# Patient Record
Sex: Male | Born: 1990 | Hispanic: No | Smoking: Current some day smoker
Health system: Southern US, Community
[De-identification: ages and names within clinical notes are randomized; demographics above are authoritative.]

---

## 2020-11-27 DIAGNOSIS — I514 Myocarditis, unspecified: Secondary | ICD-10-CM

## 2020-11-27 DIAGNOSIS — I319 Disease of pericardium, unspecified: Secondary | ICD-10-CM

## 2020-11-27 HISTORY — DX: Myocarditis, unspecified: I51.4

## 2020-11-27 HISTORY — DX: Disease of pericardium, unspecified: I31.9

## 2020-12-12 ENCOUNTER — Inpatient Hospital Stay (HOSPITAL_COMMUNITY)
Admission: EM | Admit: 2020-12-12 | Discharge: 2020-12-16 | DRG: 314 | Disposition: A | Discharge status: Home / Self Care | Payer: BC Managed Care – PPO | Attending: Internal Medicine | Admitting: Internal Medicine

## 2020-12-12 ENCOUNTER — Other Ambulatory Visit: Payer: Self-pay

## 2020-12-12 ENCOUNTER — Encounter (HOSPITAL_COMMUNITY): Payer: Self-pay | Admitting: Emergency Medicine

## 2020-12-12 DIAGNOSIS — R509 Fever, unspecified: Secondary | ICD-10-CM | POA: Diagnosis not present

## 2020-12-12 DIAGNOSIS — I514 Myocarditis, unspecified: Secondary | ICD-10-CM | POA: Diagnosis present

## 2020-12-12 DIAGNOSIS — I400 Infective myocarditis: Secondary | ICD-10-CM | POA: Diagnosis not present

## 2020-12-12 DIAGNOSIS — I319 Disease of pericardium, unspecified: Secondary | ICD-10-CM | POA: Diagnosis present

## 2020-12-12 DIAGNOSIS — Z8249 Family history of ischemic heart disease and other diseases of the circulatory system: Secondary | ICD-10-CM

## 2020-12-12 DIAGNOSIS — R112 Nausea with vomiting, unspecified: Secondary | ICD-10-CM | POA: Diagnosis present

## 2020-12-12 DIAGNOSIS — J189 Pneumonia, unspecified organism: Secondary | ICD-10-CM | POA: Diagnosis present

## 2020-12-12 DIAGNOSIS — I5021 Acute systolic (congestive) heart failure: Secondary | ICD-10-CM | POA: Diagnosis present

## 2020-12-12 DIAGNOSIS — R6510 Systemic inflammatory response syndrome (SIRS) of non-infectious origin without acute organ dysfunction: Secondary | ICD-10-CM | POA: Diagnosis present

## 2020-12-12 DIAGNOSIS — Z888 Allergy status to other drugs, medicaments and biological substances status: Secondary | ICD-10-CM

## 2020-12-12 DIAGNOSIS — E876 Hypokalemia: Secondary | ICD-10-CM | POA: Diagnosis present

## 2020-12-12 DIAGNOSIS — E869 Volume depletion, unspecified: Secondary | ICD-10-CM | POA: Diagnosis present

## 2020-12-12 DIAGNOSIS — I409 Acute myocarditis, unspecified: Secondary | ICD-10-CM | POA: Diagnosis present

## 2020-12-12 DIAGNOSIS — I959 Hypotension, unspecified: Secondary | ICD-10-CM | POA: Diagnosis present

## 2020-12-12 DIAGNOSIS — A419 Sepsis, unspecified organism: Secondary | ICD-10-CM

## 2020-12-12 DIAGNOSIS — F17210 Nicotine dependence, cigarettes, uncomplicated: Secondary | ICD-10-CM | POA: Diagnosis present

## 2020-12-12 DIAGNOSIS — R197 Diarrhea, unspecified: Secondary | ICD-10-CM | POA: Diagnosis present

## 2020-12-12 DIAGNOSIS — Z28310 Unvaccinated for covid-19: Secondary | ICD-10-CM

## 2020-12-12 DIAGNOSIS — Z8616 Personal history of COVID-19: Secondary | ICD-10-CM

## 2020-12-12 DIAGNOSIS — Z20822 Contact with and (suspected) exposure to covid-19: Secondary | ICD-10-CM | POA: Diagnosis present

## 2020-12-12 NOTE — ED Triage Notes (Signed)
Patient arrives complaining of vomiting, diarrhea and body aches since Wednesday. Patient states he felt bad and left work and was seen at urgent care. Patient states they gave him IV fluids, and prescribed him phenergan for nausea. Patient states medications have not been successful. Patient has had more episodes of vomiting. Patient states his body aches are starting to feel worse.

## 2020-12-13 ENCOUNTER — Encounter (HOSPITAL_COMMUNITY): Payer: Self-pay | Admitting: Emergency Medicine

## 2020-12-13 ENCOUNTER — Emergency Department (HOSPITAL_COMMUNITY): Payer: BC Managed Care – PPO

## 2020-12-13 ENCOUNTER — Observation Stay (HOSPITAL_COMMUNITY): Payer: BC Managed Care – PPO

## 2020-12-13 DIAGNOSIS — R6510 Systemic inflammatory response syndrome (SIRS) of non-infectious origin without acute organ dysfunction: Secondary | ICD-10-CM

## 2020-12-13 DIAGNOSIS — Z888 Allergy status to other drugs, medicaments and biological substances status: Secondary | ICD-10-CM | POA: Diagnosis not present

## 2020-12-13 DIAGNOSIS — Z8616 Personal history of COVID-19: Secondary | ICD-10-CM | POA: Diagnosis not present

## 2020-12-13 DIAGNOSIS — I319 Disease of pericardium, unspecified: Secondary | ICD-10-CM | POA: Diagnosis present

## 2020-12-13 DIAGNOSIS — F17210 Nicotine dependence, cigarettes, uncomplicated: Secondary | ICD-10-CM | POA: Diagnosis present

## 2020-12-13 DIAGNOSIS — R778 Other specified abnormalities of plasma proteins: Secondary | ICD-10-CM | POA: Diagnosis not present

## 2020-12-13 DIAGNOSIS — Z20822 Contact with and (suspected) exposure to covid-19: Secondary | ICD-10-CM | POA: Diagnosis present

## 2020-12-13 DIAGNOSIS — I400 Infective myocarditis: Secondary | ICD-10-CM

## 2020-12-13 DIAGNOSIS — Z28310 Unvaccinated for covid-19: Secondary | ICD-10-CM | POA: Diagnosis not present

## 2020-12-13 DIAGNOSIS — J189 Pneumonia, unspecified organism: Secondary | ICD-10-CM | POA: Diagnosis present

## 2020-12-13 DIAGNOSIS — E869 Volume depletion, unspecified: Secondary | ICD-10-CM | POA: Diagnosis present

## 2020-12-13 DIAGNOSIS — R509 Fever, unspecified: Secondary | ICD-10-CM

## 2020-12-13 DIAGNOSIS — R9431 Abnormal electrocardiogram [ECG] [EKG]: Secondary | ICD-10-CM | POA: Diagnosis not present

## 2020-12-13 DIAGNOSIS — R197 Diarrhea, unspecified: Secondary | ICD-10-CM | POA: Diagnosis present

## 2020-12-13 DIAGNOSIS — R112 Nausea with vomiting, unspecified: Secondary | ICD-10-CM | POA: Diagnosis present

## 2020-12-13 DIAGNOSIS — R079 Chest pain, unspecified: Secondary | ICD-10-CM | POA: Diagnosis not present

## 2020-12-13 DIAGNOSIS — I514 Myocarditis, unspecified: Secondary | ICD-10-CM | POA: Diagnosis not present

## 2020-12-13 DIAGNOSIS — E876 Hypokalemia: Secondary | ICD-10-CM | POA: Diagnosis present

## 2020-12-13 DIAGNOSIS — I409 Acute myocarditis, unspecified: Secondary | ICD-10-CM | POA: Diagnosis present

## 2020-12-13 DIAGNOSIS — I959 Hypotension, unspecified: Secondary | ICD-10-CM | POA: Diagnosis present

## 2020-12-13 DIAGNOSIS — I5021 Acute systolic (congestive) heart failure: Secondary | ICD-10-CM | POA: Diagnosis present

## 2020-12-13 DIAGNOSIS — Z8249 Family history of ischemic heart disease and other diseases of the circulatory system: Secondary | ICD-10-CM | POA: Diagnosis not present

## 2020-12-13 LAB — URINALYSIS, ROUTINE W REFLEX MICROSCOPIC
Bacteria, UA: NONE SEEN
Bilirubin Urine: NEGATIVE
Glucose, UA: NEGATIVE mg/dL
Hgb urine dipstick: NEGATIVE
Leukocytes,Ua: NEGATIVE
Nitrite: NEGATIVE
Protein, ur: NEGATIVE mg/dL
Specific Gravity, Urine: 1.01 (ref 1.005–1.030)
pH: 6 (ref 5.0–8.0)

## 2020-12-13 LAB — COMPREHENSIVE METABOLIC PANEL
ALT: 30 U/L (ref 0–44)
AST: 38 U/L (ref 15–41)
Albumin: 4.2 g/dL (ref 3.5–5.0)
Alkaline Phosphatase: 43 U/L (ref 38–126)
Anion gap: 10 (ref 5–15)
BUN: 13 mg/dL (ref 6–20)
CO2: 24 mmol/L (ref 22–32)
Calcium: 8.7 mg/dL — ABNORMAL LOW (ref 8.9–10.3)
Chloride: 101 mmol/L (ref 98–111)
Creatinine, Ser: 1.18 mg/dL (ref 0.61–1.24)
GFR, Estimated: >60 mL/min (ref >60)
Glucose, Bld: 124 mg/dL — ABNORMAL HIGH (ref 70–99)
Potassium: 4.1 mmol/L (ref 3.5–5.1)
Sodium: 135 mmol/L (ref 135–145)
Total Bilirubin: 0.8 mg/dL (ref 0.3–1.2)
Total Protein: 7.1 g/dL (ref 6.5–8.1)

## 2020-12-13 LAB — CBC
HCT: 42.6 % (ref 39.0–52.0)
Hemoglobin: 14.4 g/dL (ref 13.0–17.0)
MCH: 29.4 pg (ref 26.0–34.0)
MCHC: 33.8 g/dL (ref 30.0–36.0)
MCV: 86.9 fL (ref 80.0–100.0)
Platelets: 178 10*3/uL (ref 150–400)
RBC: 4.9 MIL/uL (ref 4.22–5.81)
RDW: 12.2 % (ref 11.5–15.5)
WBC: 10.2 10*3/uL (ref 4.0–10.5)
nRBC: 0 % (ref 0.0–0.2)

## 2020-12-13 LAB — ECHOCARDIOGRAM COMPLETE
AV Mean grad: 2 mmHg
AV Peak grad: 3.5 mmHg
Ao pk vel: 0.94 m/s
Area-P 1/2: 5.42 cm2
Calc EF: 43.7 %
Height: 71 in
Single Plane A2C EF: 43.4 %
Single Plane A4C EF: 42.3 %
Weight: 3200 oz

## 2020-12-13 LAB — C-REACTIVE PROTEIN: CRP: 6.5 mg/dL — ABNORMAL HIGH (ref <1.0)

## 2020-12-13 LAB — RESPIRATORY PANEL BY PCR

## 2020-12-13 LAB — PROCALCITONIN: Procalcitonin: <0.1 ng/mL

## 2020-12-13 LAB — MAGNESIUM: Magnesium: 1.9 mg/dL (ref 1.7–2.4)

## 2020-12-13 LAB — LIPASE, BLOOD: Lipase: 27 U/L (ref 11–51)

## 2020-12-13 LAB — RESP PANEL BY RT-PCR (FLU A&B, COVID) ARPGX2
Influenza A by PCR: NEGATIVE
Influenza B by PCR: NEGATIVE
SARS Coronavirus 2 by RT PCR: NEGATIVE

## 2020-12-13 LAB — CK: Total CK: 147 U/L (ref 49–397)

## 2020-12-13 LAB — LACTIC ACID, PLASMA: Lactic Acid, Venous: 0.8 mmol/L (ref 0.5–1.9)

## 2020-12-13 LAB — TROPONIN I (HIGH SENSITIVITY)
Troponin I (High Sensitivity): 1614 ng/L (ref <18)
Troponin I (High Sensitivity): 1447 ng/L (ref <18)
Troponin I (High Sensitivity): 9714 ng/L (ref <18)

## 2020-12-13 LAB — HIV ANTIBODY (ROUTINE TESTING W REFLEX): HIV Screen 4th Generation wRfx: NONREACTIVE

## 2020-12-13 LAB — MRSA NEXT GEN BY PCR, NASAL: MRSA by PCR Next Gen: NOT DETECTED

## 2020-12-13 LAB — MONONUCLEOSIS SCREEN: Mono Screen: NEGATIVE

## 2020-12-13 LAB — SEDIMENTATION RATE: Sed Rate: 9 mm/hr (ref 0–16)

## 2020-12-13 LAB — D-DIMER, QUANTITATIVE: D-Dimer, Quant: 0.36 ug/mL-FEU (ref 0.00–0.50)

## 2020-12-13 LAB — BRAIN NATRIURETIC PEPTIDE: B Natriuretic Peptide: 34.5 pg/mL (ref 0.0–100.0)

## 2020-12-13 IMAGING — CR DG CHEST 2V
2 series · 2 of 2 positions shown · non-contrast
Comparison: [DATE]

CLINICAL DATA: Shortness of breath

EXAM:
CHEST - 2 VIEW

[w chest pa]
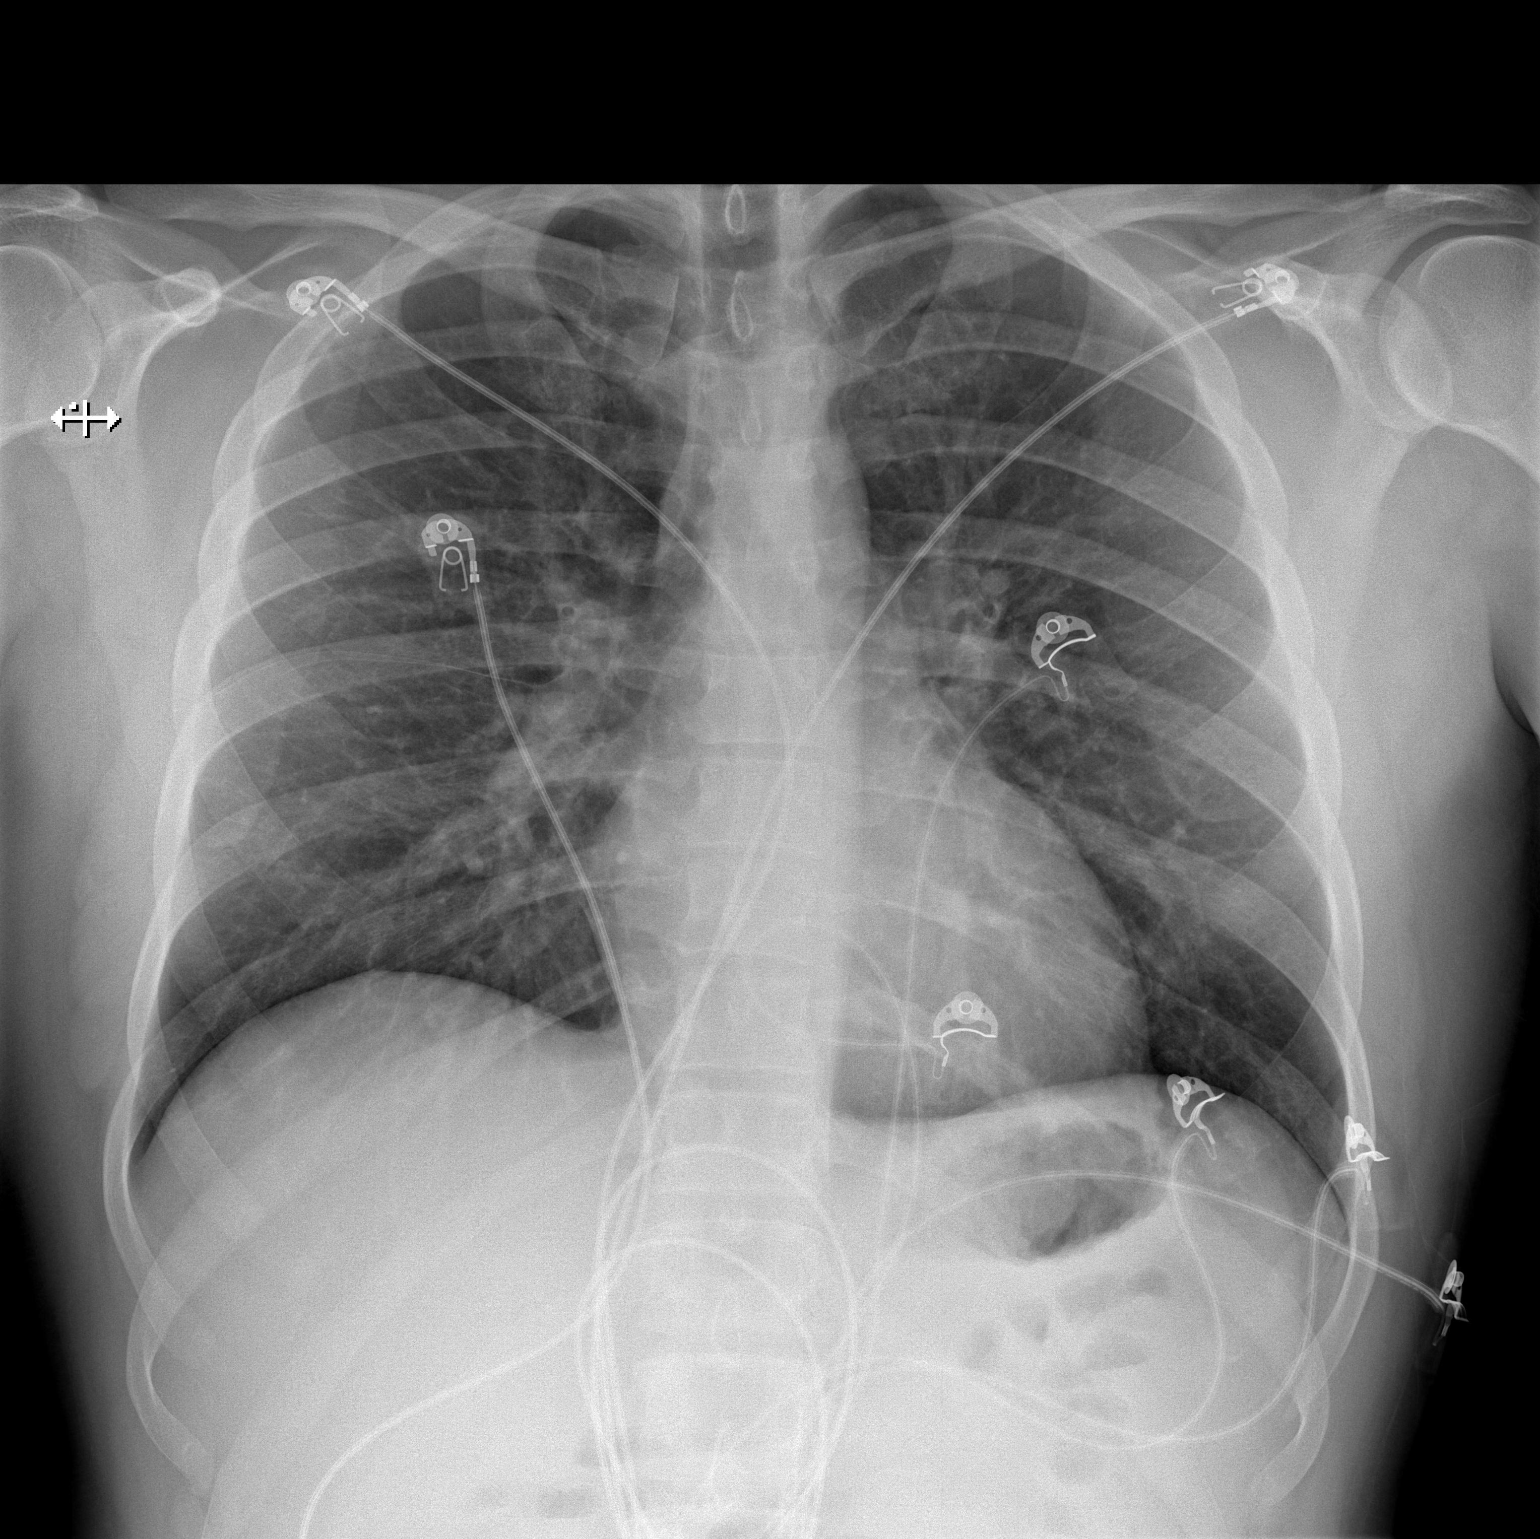

[w chest lat]
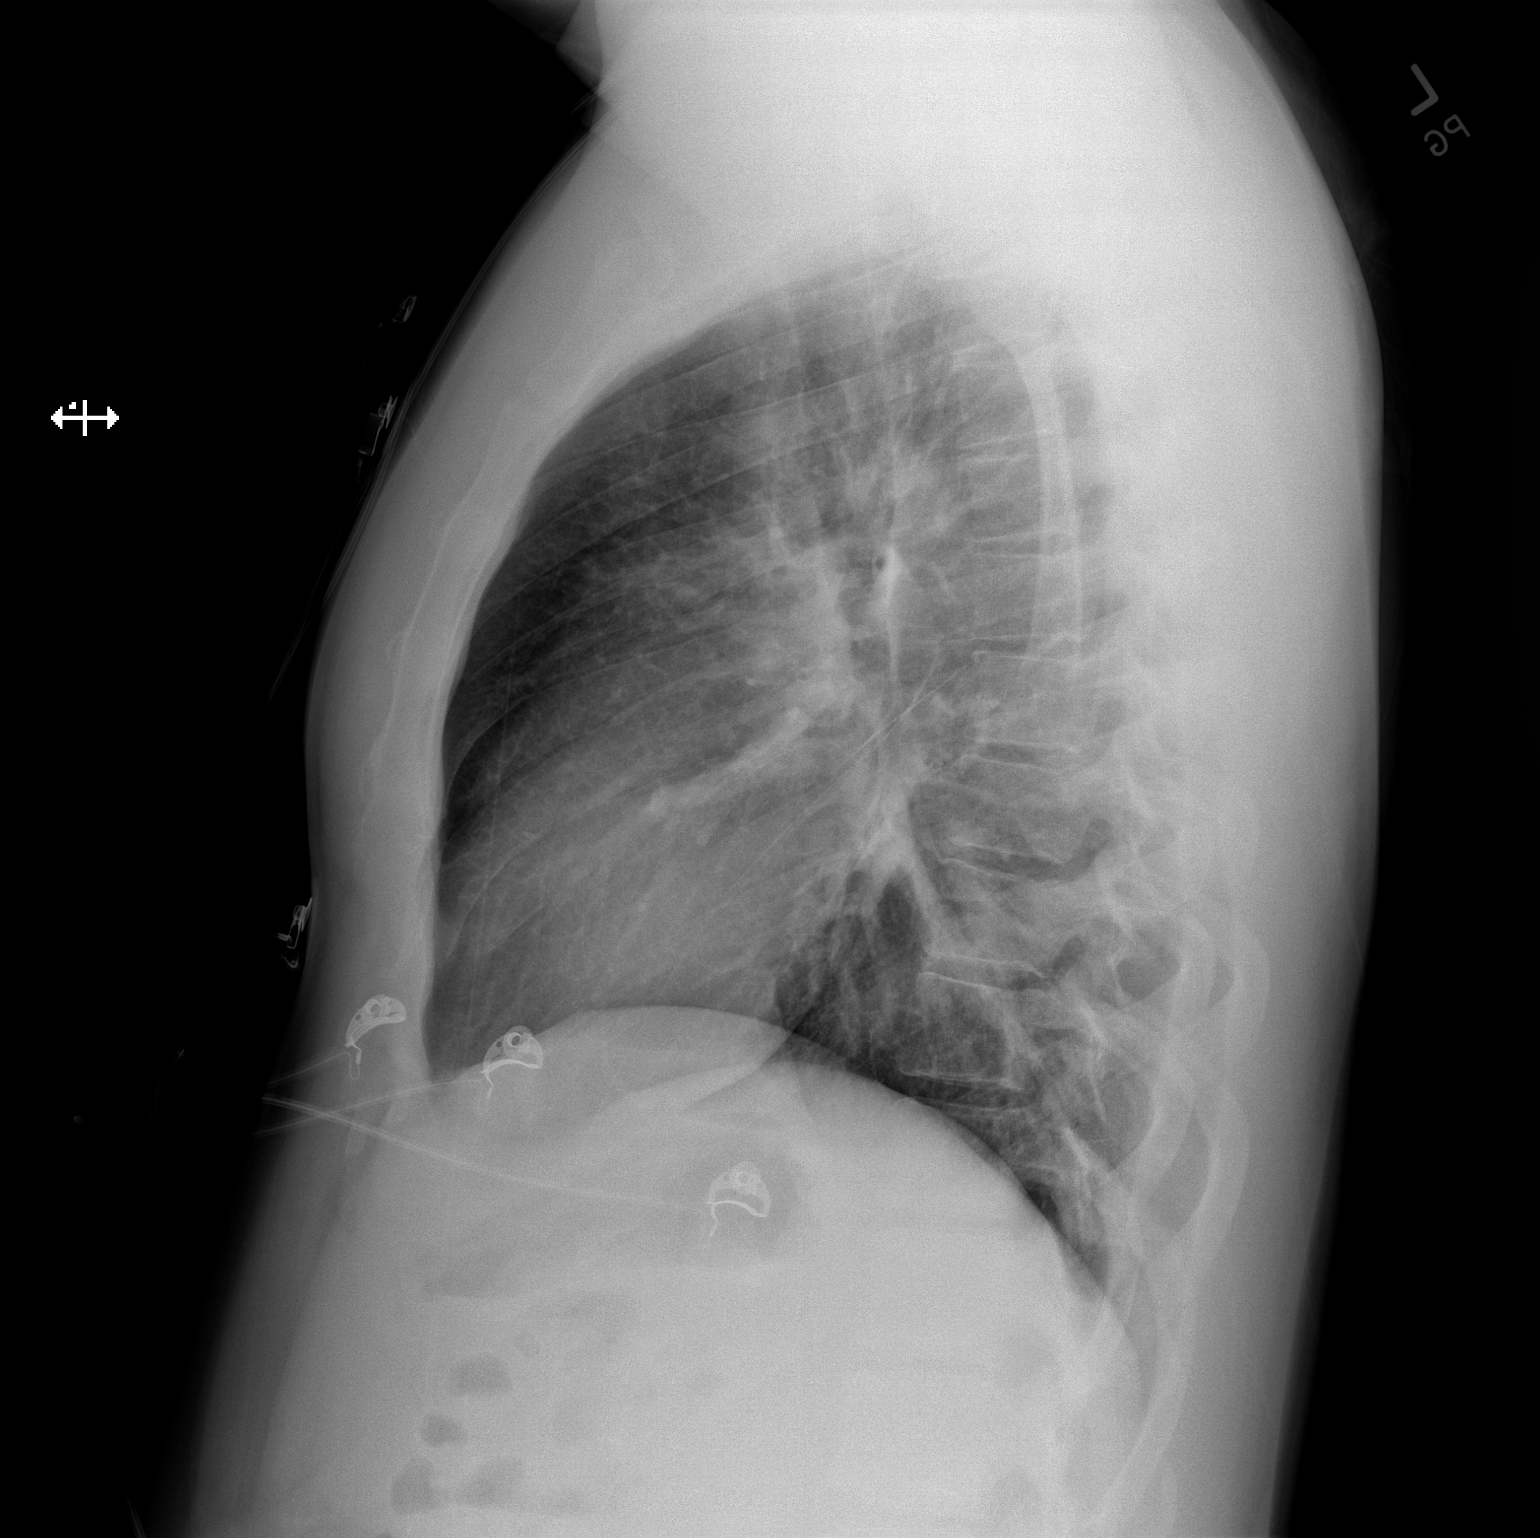

[2 of 2 positions shown; findings below may reference images not displayed]

FINDINGS: Artifact from EKG leads.

Normal heart size and mediastinal contours. No acute infiltrate or
edema. No effusion or pneumothorax. No acute osseous findings.
IMPRESSION: No active cardiopulmonary disease.

## 2020-12-13 MED ORDER — ACETAMINOPHEN 325 MG PO TABS
650.0000 mg | ORAL_TABLET | ORAL | Status: DC | PRN
  Administered 2020-12-13 – 2020-12-15 (×4): 650 mg via ORAL
  Filled 2020-12-13 (×4): qty 2

## 2020-12-13 MED ORDER — LACTATED RINGERS IV BOLUS
1000.0000 mL | Freq: Once | INTRAVENOUS | Status: AC
  Administered 2020-12-13: 1000 mL via INTRAVENOUS

## 2020-12-13 MED ORDER — ONDANSETRON HCL 4 MG/2ML IJ SOLN: 4.0000 mg | Freq: Four times a day (QID) | INTRAMUSCULAR | Status: DC | PRN

## 2020-12-13 MED ORDER — SODIUM CHLORIDE 0.9 % IV SOLN
2.0000 g | Freq: Three times a day (TID) | INTRAVENOUS | Status: DC
  Administered 2020-12-13 – 2020-12-14 (×2): 2 g via INTRAVENOUS
  Filled 2020-12-13 (×2): qty 2

## 2020-12-13 MED ORDER — ACETAMINOPHEN 650 MG RE SUPP: 650.0000 mg | Freq: Four times a day (QID) | RECTAL | Status: DC | PRN

## 2020-12-13 MED ORDER — VANCOMYCIN HCL 1500 MG/300ML IV SOLN: 1500.0000 mg | Freq: Two times a day (BID) | INTRAVENOUS | Status: DC

## 2020-12-13 MED ORDER — SODIUM CHLORIDE 0.9 % IV BOLUS
500.0000 mL | Freq: Once | INTRAVENOUS | Status: AC
  Administered 2020-12-13: 500 mL via INTRAVENOUS

## 2020-12-13 MED ORDER — VANCOMYCIN HCL 2000 MG/400ML IV SOLN
2000.0000 mg | Freq: Once | INTRAVENOUS | Status: AC
  Administered 2020-12-13: 2000 mg via INTRAVENOUS
  Filled 2020-12-13: qty 400

## 2020-12-13 MED ORDER — LACTATED RINGERS IV SOLN: INTRAVENOUS | Status: DC

## 2020-12-13 MED ORDER — MAGNESIUM SULFATE 2 GM/50ML IV SOLN
2.0000 g | Freq: Once | INTRAVENOUS | Status: AC
  Administered 2020-12-13: 2 g via INTRAVENOUS
  Filled 2020-12-13: qty 50

## 2020-12-13 MED ORDER — VANCOMYCIN HCL IN DEXTROSE 1-5 GM/200ML-% IV SOLN
1000.0000 mg | Freq: Once | INTRAVENOUS | Status: DC
  Filled 2020-12-13: qty 200

## 2020-12-13 MED ORDER — PROCHLORPERAZINE EDISYLATE 10 MG/2ML IJ SOLN
10.0000 mg | Freq: Once | INTRAMUSCULAR | Status: AC
  Administered 2020-12-13: 10 mg via INTRAVENOUS
  Filled 2020-12-13: qty 2

## 2020-12-13 MED ORDER — ONDANSETRON HCL 4 MG/2ML IJ SOLN
4.0000 mg | Freq: Once | INTRAMUSCULAR | Status: AC
  Administered 2020-12-13: 4 mg via INTRAVENOUS
  Filled 2020-12-13: qty 2

## 2020-12-13 MED ORDER — POTASSIUM CHLORIDE CRYS ER 20 MEQ PO TBCR
20.0000 meq | EXTENDED_RELEASE_TABLET | Freq: Once | ORAL | Status: AC
  Administered 2020-12-13: 20 meq via ORAL
  Filled 2020-12-13: qty 1

## 2020-12-13 MED ORDER — OXYCODONE HCL 5 MG PO TABS: 5.0000 mg | ORAL_TABLET | ORAL | Status: DC | PRN

## 2020-12-13 MED ORDER — SODIUM CHLORIDE 0.9 % IV BOLUS
1000.0000 mL | Freq: Once | INTRAVENOUS | Status: AC
  Administered 2020-12-13: 1000 mL via INTRAVENOUS

## 2020-12-13 MED ORDER — KETOROLAC TROMETHAMINE 30 MG/ML IJ SOLN
30.0000 mg | Freq: Once | INTRAMUSCULAR | Status: AC
  Administered 2020-12-13: 30 mg via INTRAVENOUS
  Filled 2020-12-13: qty 1

## 2020-12-13 MED ORDER — IBUPROFEN 200 MG PO TABS
600.0000 mg | ORAL_TABLET | Freq: Three times a day (TID) | ORAL | Status: DC
  Administered 2020-12-13 – 2020-12-14 (×2): 600 mg via ORAL
  Filled 2020-12-13 (×2): qty 3

## 2020-12-13 MED ORDER — ONDANSETRON HCL 4 MG PO TABS: 4.0000 mg | ORAL_TABLET | Freq: Four times a day (QID) | ORAL | Status: DC | PRN

## 2020-12-13 MED ORDER — COLCHICINE 0.6 MG PO TABS
0.6000 mg | ORAL_TABLET | Freq: Two times a day (BID) | ORAL | Status: DC
  Administered 2020-12-13 – 2020-12-16 (×6): 0.6 mg via ORAL
  Filled 2020-12-13 (×6): qty 1

## 2020-12-13 MED ORDER — SPIRONOLACTONE 12.5 MG HALF TABLET
12.5000 mg | ORAL_TABLET | Freq: Every day | ORAL | Status: DC
  Administered 2020-12-13 – 2020-12-14 (×2): 12.5 mg via ORAL
  Filled 2020-12-13 (×2): qty 1

## 2020-12-13 MED ORDER — DIPHENHYDRAMINE HCL 50 MG/ML IJ SOLN
25.0000 mg | Freq: Once | INTRAMUSCULAR | Status: AC
  Administered 2020-12-13: 25 mg via INTRAVENOUS
  Filled 2020-12-13: qty 1

## 2020-12-13 MED ORDER — FUROSEMIDE 10 MG/ML IJ SOLN
20.0000 mg | Freq: Once | INTRAMUSCULAR | Status: AC
  Administered 2020-12-13: 20 mg via INTRAVENOUS
  Filled 2020-12-13: qty 2

## 2020-12-13 MED ORDER — ACETAMINOPHEN 325 MG PO TABS
650.0000 mg | ORAL_TABLET | Freq: Four times a day (QID) | ORAL | Status: DC | PRN
  Administered 2020-12-13 (×2): 650 mg via ORAL
  Filled 2020-12-13 (×2): qty 2

## 2020-12-13 MED ORDER — METRONIDAZOLE 500 MG/100ML IV SOLN
500.0000 mg | Freq: Once | INTRAVENOUS | Status: AC
Start: 2020-12-13 — End: 2020-12-13
  Administered 2020-12-13: 500 mg via INTRAVENOUS
  Filled 2020-12-13: qty 100

## 2020-12-13 MED ORDER — ENOXAPARIN SODIUM 40 MG/0.4ML IJ SOSY
40.0000 mg | PREFILLED_SYRINGE | INTRAMUSCULAR | Status: DC
  Administered 2020-12-13: 40 mg via SUBCUTANEOUS
  Filled 2020-12-13 (×3): qty 0.4

## 2020-12-13 MED ORDER — SODIUM CHLORIDE 0.9 % IV SOLN
2.0000 g | Freq: Once | INTRAVENOUS | Status: AC
  Administered 2020-12-13: 2 g via INTRAVENOUS
  Filled 2020-12-13: qty 2

## 2020-12-13 MED ORDER — ACETAMINOPHEN 325 MG PO TABS
650.0000 mg | ORAL_TABLET | Freq: Once | ORAL | Status: AC
  Administered 2020-12-13: 650 mg via ORAL
  Filled 2020-12-13: qty 2

## 2020-12-13 NOTE — Sepsis Progress Note (Signed)
Monitoring for code sepsis protocol. 

## 2020-12-13 NOTE — ED Notes (Signed)
Critical lab: Trop 1614   Notified Tyrone Kyle DO at this time.

## 2020-12-13 NOTE — ED Notes (Signed)
Pt stated he felt like he could not take a deep breath. O2 stats 96%. Pt placed on 2LNC.

## 2020-12-13 NOTE — Progress Notes (Signed)
  Echocardiogram 2D Echocardiogram has been performed.  Dale Macias F 12/13/2020, 9:03 AM

## 2020-12-13 NOTE — H&P (Signed)
History and Physical    Dale Macias YWV:[id number] DOB: [birth year] DOA: 12/12/2020  PCP: Pcp, No  Patient coming from: Home  Chief Complaint: diarrhea, vomiting  HPI: Dale Macias is a 30 y.o. male with no significant medical history. Presenting with diarrhea, vomiting. His symptoms started 3 days ago w/ diarrhea and vomiting. He tried pepto, but it didn't provide complete relief. He did not notice any blood in stool or emesis. His symptoms progressed to include fatigue and body aches the next day. Yesterday, he noticed fevers and chills. He tried alternating APAP and motrin. He tried phenergan, which did help. His body aches included knee, hip and elbow pain. He also had bilateral chest back from nipple to neck on both sides. It was squeezing and constant. It made it hard to catch his breath. When his symptoms did not resolve last night, he decided to come to the ED for help. He denies any other aggravating or alleviating factors.   ED Course: He is found to be hypotensive and febrile. CRP was elevated at 6.5. He was COVID negative. His troponin was 1614 w/ a negative EKG. He was started on broad spec abx. TRH was called for admission.    Review of Systems:  Review of systems is otherwise negative for all not mentioned in HPI.   PMHx History reviewed. No pertinent past medical history.  PSHx History reviewed. No pertinent surgical history.  SocHx  reports that he has been smoking cigarettes. He has never used smokeless tobacco. He reports current alcohol use. He reports that he does not use drugs.  Allergies  Allergen Reactions   Benzonatate Swelling    Throat swelling    FamHx Reviewed, non-contributory  Prior to Admission medications   Not on File    Physical Exam: Vitals:   12/13/20 0430 12/13/20 0517 12/13/20 0540 12/13/20 0658  BP: (!) 90/56 90/62    Pulse: (!) 104     Resp: (!) 27     Temp:   (!) 101.1 F (38.4 C) 99.9 F (37.7 C)  TempSrc:   Oral  Oral  SpO2: 92%     Weight:      Height:        General: 29 y.o. male resting in bed in NAD Eyes: PERRL, normal sclera ENMT: Nares patent w/o discharge, orophaynx clear, dentition normal, ears w/o discharge/lesions/ulcers Neck: Supple, trachea midline Cardiovascular: tachy, +S1, S2, no m/g/r, equal pulses throughout Respiratory: CTABL, no w/r/r, normal WOB GI: BS+, NDNT, no masses noted, no organomegaly noted MSK: No e/c/c; good ROM at elbows, knees, hip, no edema noted at those joints Skin: No rashes, bruises, ulcerations noted; no evidence of tick bites Neuro: A&O x 3, no focal deficits Psyc: Appropriate interaction and affect, calm/cooperative  Labs on Admission: I have personally reviewed following labs and imaging studies  CBC: Recent Labs  Lab 12/13/20 0001  WBC 10.2  HGB 14.4  HCT 42.6  MCV 86.9  PLT 178   Basic Metabolic Panel: Recent Labs  Lab 12/13/20 0001  NA 135  K 4.1  CL 101  CO2 24  GLUCOSE 124*  BUN 13  CREATININE 1.18  CALCIUM 8.7*  MG 1.9   GFR: Estimated Creatinine Clearance: 106.5 mL/min (by C-G formula based on SCr of 1.18 mg/dL). Liver Function Tests: Recent Labs  Lab 12/13/20 0001  AST 38  ALT 30  ALKPHOS 43  BILITOT 0.8  PROT 7.1  ALBUMIN 4.2   Recent Labs  Lab 12/13/20 0001  LIPASE 27   No results for input(s): AMMONIA in the last 168 hours. Coagulation Profile: No results for input(s): INR, PROTIME in the last 168 hours. Cardiac Enzymes: No results for input(s): CKTOTAL, CKMB, CKMBINDEX, TROPONINI in the last 168 hours. BNP (last 3 results) No results for input(s): PROBNP in the last 8760 hours. HbA1C: No results for input(s): HGBA1C in the last 72 hours. CBG: No results for input(s): GLUCAP in the last 168 hours. Lipid Profile: No results for input(s): CHOL, HDL, LDLCALC, TRIG, CHOLHDL, LDLDIRECT in the last 72 hours. Thyroid Function Tests: No results for input(s): TSH, T4TOTAL, FREET4, T3FREE, THYROIDAB in the  last 72 hours. Anemia Panel: No results for input(s): VITAMINB12, FOLATE, FERRITIN, TIBC, IRON, RETICCTPCT in the last 72 hours. Urine analysis:    Component Value Date/Time   COLORURINE YELLOW (A) 12/13/2020 0001   APPEARANCEUR CLEAR (A) 12/13/2020 0001   LABSPEC 1.010 12/13/2020 0001   PHURINE 6.0 12/13/2020 0001   GLUCOSEU NEGATIVE 12/13/2020 0001   HGBUR NEGATIVE 12/13/2020 0001   BILIRUBINUR NEGATIVE 12/13/2020 0001   KETONESUR TRACE (A) 12/13/2020 0001   PROTEINUR NEGATIVE 12/13/2020 0001   NITRITE NEGATIVE 12/13/2020 0001   LEUKOCYTESUR NEGATIVE 12/13/2020 0001    Radiological Exams on Admission: DG Chest 2 View  Result Date: 12/13/2020 CLINICAL DATA:  Shortness of breath EXAM: CHEST - 2 VIEW COMPARISON:  09/12/2009 FINDINGS: Artifact from EKG leads. Normal heart size and mediastinal contours. No acute infiltrate or edema. No effusion or pneumothorax. No acute osseous findings. IMPRESSION: No active cardiopulmonary disease. Electronically Signed   By: Tiburcio Pea M.D.   On: 12/13/2020 06:54    EKG: Independently reviewed. Sinus tach, no st elevations  Assessment/Plan SIRS Febrile illness     - placed in obs, tele     - started on broad spec abx; will continue for now     - he has an elevated CRP; no evidence of joint effusions     - he c/o wide spread myalgias that are worst in the chest (see elevated troponin below); we will add CRP to the labs; LFTs are ok     - change him to inpt     - COVID is negative     - check stool studies, follow Bld Cx     - fluids, APAP  Elevated troponin     - EKG is normal     - his CP is partially reproducible     - no evidence of edema on exam, CXR is negative; no previous heart history     - trp is 1614, rpt pending     - CRP is elevated     - ?some form of carditis?; checking d-dimer, BNP, and echo     - spoke with cards; will see, appreciate their assistance     - hold on tx-dose anticoag for now  DVT prophylaxis: lovenox   Code Status: FULL  Family Communication: w/ wife at bedside  Consults called: Cardiology   Status is: Observation  The patient will require care spanning > 2 midnights and should be moved to inpatient because: Inpatient level of care appropriate due to severity of illness  Dispo: The patient is from: Home              Anticipated d/c is to: Home              Patient currently is not medically stable to d/c.   Difficult to place patient No  Time  spent coordinating admission: 45 minutes  Leotha Westermeyer A Traeton Bordas DO [facility]  If 7PM-7AM, please contact night-coverage [email or web address]  12/13/2020, 7:18 AM

## 2020-12-13 NOTE — Sepsis Progress Note (Signed)
Notified provider of need to order lactic acid. ° °

## 2020-12-13 NOTE — Plan of Care (Signed)

## 2020-12-13 NOTE — Consult Note (Signed)
Advanced Heart Failure Team Consult Note   Primary Physician: Pcp, No PCP-Cardiologist:  None  Reason for Consultation: Acute myocarditis  HPI:    Dale Macias is seen today for evaluation of acute myocarditis at the request of Dr. Debara Pickett.  Dale Macias is a 30 y.o. male with no significant PMHx,   Presented to [facility] ER with 3 days of diarrhea, vomiting, fever, chills, fatigue and pleuritic CP.  Labs in ER notable for hstrop 1,614 -> 1,447 BNP 34  PCT < 0.1. ESR 9 d-dimer 0.36 COVID-19 negative  Says he had COVID-19 infection about a month ago, but has had the virus 3x - unvaccinated.  BP was low in ER. Got IVF.   Echo EF 45%  Still having severe CP. Worse with deep breathing and standing.   Review of Systems: [y] = yes, [ ]  = no   General: Weight gain [ ] ; Weight loss [ ] ; Anorexia [ ] ; Fatigue [ y]; Fever Blue.Reese ]; Chills [ y]; Weakness [ ]   Cardiac: Chest pain/pressure [ y]; Resting SOB [ ] ; Exertional SOB [ ] ; Orthopnea [ ] ; Pedal Edema [ ] ; Palpitations [ ] ; Syncope [ ] ; Presyncope [ ] ; Paroxysmal nocturnal dyspnea[ ]   Pulmonary: Cough [ ] ; Wheezing[ ] ; Hemoptysis[ ] ; Sputum [ ] ; Snoring [ ]   GI: Vomiting[ ] ; Dysphagia[ ] ; Melena[ ] ; Hematochezia [ ] ; Heartburn[ ] ; Abdominal pain [ ] ; Constipation [ ] ; Diarrhea [ ] ; BRBPR [ ]   GU: Hematuria[ ] ; Dysuria [ ] ; Nocturia[ ]   Vascular: Pain in legs with walking [ ] ; Pain in feet with lying flat [ ] ; Non-healing sores [ ] ; Stroke [ ] ; TIA [ ] ; Slurred speech [ ] ;  Neuro: Headaches[ ] ; Vertigo[ ] ; Seizures[ ] ; Paresthesias[ ] ;Blurred vision [ ] ; Diplopia [ ] ; Vision changes [ ]   Ortho/Skin: Arthritis [ ] ; Joint pain [ ] ; Muscle pain [ ] ; Joint swelling [ ] ; Back Pain [ ] ; Rash [ ]   Psych: Depression[ ] ; Anxiety[ ]   Heme: Bleeding problems [ ] ; Clotting disorders [ ] ; Anemia [ ]   Endocrine: Diabetes [ ] ; Thyroid dysfunction[ ]   Home Medications Prior to Admission medications   Medication Sig Start Date End Date Taking?  Authorizing Provider  promethazine (PHENERGAN) 25 MG tablet Take 25 mg by mouth daily as needed. 06/23/20  Yes [provider]    Past Medical History: History reviewed. No pertinent past medical history.  Past Surgical History: History reviewed. No pertinent surgical history.  Family History:  No FHx of SCD. Personally reviewed   Social History: Social History   Socioeconomic History   Marital status: Unknown    Spouse name: Not on file   Number of children: Not on file   Years of education: Not on file   Highest education level: Not on file  Occupational History   Not on file  Tobacco Use   Smoking status: Some Days    Types: Cigarettes   Smokeless tobacco: Never  Substance and Sexual Activity   Alcohol use: Yes    Comment: occassional   Drug use: Never   Sexual activity: Not on file  Other Topics Concern   Not on file  Social History Narrative   Not on file   Social Determinants of Health   Financial Resource Strain: Not on file  Food Insecurity: Not on file  Transportation Needs: Not on file  Physical Activity: Not on file  Stress: Not on file  Social Connections: Not on file  Allergies:  Allergies  Allergen Reactions   Benzonatate Swelling    Throat swelling    Objective:    Vital Signs:   Temp:  [99.8 F (37.7 C)-101.1 F (38.4 C)] 100.2 F (37.9 C) (09/17 1241) Pulse Rate:  [90-106] 97 (09/17 1330) Resp:  [18-27] 25 (09/17 1330) BP: (90-101)/(53-69) 92/53 (09/17 1330) SpO2:  [89 %-100 %] 97 % (09/17 1330) Weight:  [90.7 kg] 90.7 kg (09/16 2345)    Weight change: Filed Weights   12/12/20 2345  Weight: 90.7 kg    Intake/Output:  No intake or output data in the 24 hours ending 12/13/20 1429    Physical Exam    General:  Mildly uncomfortable appearing. No resp difficulty HEENT: normal Neck: supple. JVP 9-10 . Carotids 2+ bilat; no bruits. No lymphadenopathy or thyromegaly appreciated. Cor: PMI nondisplaced. Regular rate  & rhythm. No rubs, gallops or murmurs. Lungs: clear Abdomen: soft, nontender, nondistended. No hepatosplenomegaly. No bruits or masses. Good bowel sounds. Extremities: no cyanosis, clubbing, rash, edema Neuro: alert & orientedx3, cranial nerves grossly intact. moves all 4 extremities w/o difficulty. Affect pleasant   Telemetry   Sinus 90-100 Personally reviewed  F/u ECG with Sinus rhythm and mild diffuse ST elevation.   EKG    Sinus tach 104 No ST-T wave abnormalities. Personally reviewed  Labs   Basic Metabolic Panel: Recent Labs  Lab 12/13/20 0001  NA 135  K 4.1  CL 101  CO2 24  GLUCOSE 124*  BUN 13  CREATININE 1.18  CALCIUM 8.7*  MG 1.9    Liver Function Tests: Recent Labs  Lab 12/13/20 0001  AST 38  ALT 30  ALKPHOS 43  BILITOT 0.8  PROT 7.1  ALBUMIN 4.2   Recent Labs  Lab 12/13/20 0001  LIPASE 27   No results for input(s): AMMONIA in the last 168 hours.  CBC: Recent Labs  Lab 12/13/20 0001  WBC 10.2  HGB 14.4  HCT 42.6  MCV 86.9  PLT 178    Cardiac Enzymes: Recent Labs  Lab 12/13/20 0807  CKTOTAL 147    BNP: BNP (last 3 results) Recent Labs    12/13/20 0610  BNP 34.5    ProBNP (last 3 results) No results for input(s): PROBNP in the last 8760 hours.   CBG: No results for input(s): GLUCAP in the last 168 hours.  Coagulation Studies: No results for input(s): LABPROT, INR in the last 72 hours.   Imaging   DG Chest 2 View  Result Date: 12/13/2020 CLINICAL DATA:  Shortness of breath EXAM: CHEST - 2 VIEW COMPARISON:  09/12/2009 FINDINGS: Artifact from EKG leads. Normal heart size and mediastinal contours. No acute infiltrate or edema. No effusion or pneumothorax. No acute osseous findings. IMPRESSION: No active cardiopulmonary disease. Electronically Signed   By: Dale Macias M.D.   On: 12/13/2020 06:54   ECHOCARDIOGRAM COMPLETE  Result Date: 12/13/2020    ECHOCARDIOGRAM REPORT   Patient Name:   Dale Macias Date  of Exam: 12/13/2020 Medical Rec #:  [id number]          Height:       71.0 in Accession #:    [id number]         Weight:       200.0 lb Date of Birth:  10-13-1990          BSA:          2.109 m Patient Age:    89 years  BP:           98/68 mmHg Patient Gender: M                  HR:           94 bpm. Exam Location:  Inpatient Procedure: 2D Echo, Cardiac Doppler, Color Doppler and 3D Echo STAT ECHO Indications:    Elevated troponin. Chest pain  History:        Patient has no prior history of Echocardiogram examinations.                 Signs/Symptoms:Shortness of Breath and Chest Pain. Has had Covid                 19 three times, unvaccinated. Diarhea.  Sonographer:    Merrie Roof RDCS Referring Phys: [id number] [place]  1. Left ventricular ejection fraction, by estimation, is approximately 45%. The left ventricle has mildly decreased function. The left ventricle demonstrates regional wall motion abnormalities (see scoring diagram/findings for description). There is mild left ventricular hypertrophy. Left ventricular diastolic parameters were normal.  2. Right ventricular systolic function is normal. The right ventricular size is normal. Tricuspid regurgitation signal is inadequate for assessing PA pressure.  3. The mitral valve is grossly normal. Trivial mitral valve regurgitation.  4. The aortic valve is tricuspid. Aortic valve regurgitation is not visualized. No aortic stenosis is present. Aortic valve mean gradient measures 2.0 mmHg.  5. The inferior vena cava is normal in size with <50% respiratory variability, suggesting right atrial pressure of 8 mmHg. Comparison(s): No prior Echocardiogram. FINDINGS  Left Ventricle: Left ventricular ejection fraction, by estimation, is 45%. The left ventricle has mildly decreased function. The left ventricle demonstrates regional wall motion abnormalities. The left ventricular internal cavity size was normal in size. There is mild left ventricular  hypertrophy. Left ventricular diastolic parameters were normal.  LV Wall Scoring: The mid and distal anterior septum and apex are hypokinetic. The entire anterior wall, entire lateral wall, entire inferior wall, basal anteroseptal segment, mid inferoseptal segment, and basal inferoseptal segment are normal. Right Ventricle: The right ventricular size is normal. No increase in right ventricular wall thickness. Right ventricular systolic function is normal. Tricuspid regurgitation signal is inadequate for assessing PA pressure. Left Atrium: Left atrial size was normal in size. Right Atrium: Right atrial size was normal in size. Pericardium: There is no evidence of pericardial effusion. Mitral Valve: The mitral valve is grossly normal. Trivial mitral valve regurgitation. Tricuspid Valve: The tricuspid valve is grossly normal. Tricuspid valve regurgitation is trivial. Aortic Valve: The aortic valve is tricuspid. Aortic valve regurgitation is not visualized. No aortic stenosis is present. Aortic valve mean gradient measures 2.0 mmHg. Aortic valve peak gradient measures 3.5 mmHg. Pulmonic Valve: The pulmonic valve was grossly normal. Pulmonic valve regurgitation is trivial. Aorta: The aortic root is normal in size and structure. Venous: The inferior vena cava is normal in size with less than 50% respiratory variability, suggesting right atrial pressure of 8 mmHg. IAS/Shunts: No atrial level shunt detected by color flow Doppler.   LV Volumes (MOD) LV vol d, MOD A2C: 130.0 ml Diastology LV vol d, MOD A4C: 126.0 ml LV e' medial:    10.70 cm/s LV vol s, MOD A2C: 73.6 ml  LV E/e' medial:  6.8 LV vol s, MOD A4C: 72.7 ml  LV e' lateral:   10.00 cm/s LV SV MOD A2C:     56.4 ml  LV E/e' lateral:  7.2 LV SV MOD A4C:     126.0 ml LV SV MOD BP:      57.7 ml                              3D Volume EF:                             3D EF:        50 %                             LV EDV:       146 ml                             LV ESV:        73 ml                             LV SV:        73 ml RIGHT VENTRICLE          IVC RV Basal diam:  2.90 cm  IVC diam: 2.00 cm LEFT ATRIUM           Index       RIGHT ATRIUM           Index LA Vol (A2C): 52.4 ml 24.85 ml/m RA Area:     11.60 cm                                   RA Volume:   27.90 ml  13.23 ml/m  AORTIC VALVE AV Vmax:           93.90 cm/s AV Vmean:          67.300 cm/s AV VTI:            0.172 m AV Peak Grad:      3.5 mmHg AV Mean Grad:      2.0 mmHg LVOT Vmax:         78.40 cm/s LVOT Vmean:        55.300 cm/s LVOT VTI:          0.149 m LVOT/AV VTI ratio: 0.87 MITRAL VALVE MV Area (PHT): 5.42 cm    SHUNTS MV Decel Time: 140 msec    Systemic VTI: 0.15 m MV E velocity: 72.40 cm/s MV A velocity: 50.10 cm/s MV E/A ratio:  1.45 Dale Lesches MD Electronically signed by Dale Lesches MD Signature Date/Time: 12/13/2020/11:36:47 AM    Final      Medications:     Current Medications:  enoxaparin (LOVENOX) injection  40 mg Subcutaneous Q24H    Infusions:  lactated ringers 150 mL/hr at 12/13/20 1301     Assessment/Plan   1. Acute myopericarditis with likely pleuroditis as well - suspect viral  in nature - will check full viral panel - Echo EF 45% - volume status up after IVF. Give one dose IV lasix $Remove'20mg'kscWCem$  - start colchicine and NSAIDs (with myocardial involvement will avoid high dose NSAIDs) - will need GDMT once BP stable - check cMRI - follow trops - limit actiivty x 6 months  Length of Stay: 0  Dale Bickers, MD  12/13/2020, 2:29 PM  Advanced Heart Failure Team Pager [phone] (M-F; 7a - 5p)  Please contact [facility] for night-coverage after hours (4p -7a ) and weekends on [email or web address]

## 2020-12-13 NOTE — Progress Notes (Signed)
   12/13/20 2200  Assess: MEWS Score  Temp (!) 102.3 F (39.1 C)  BP 100/68  Pulse Rate (!) 117  ECG Heart Rate (!) 110  Resp 19  Level of Consciousness Alert  SpO2 93 %  O2 Device Nasal Cannula  O2 Flow Rate (L/min) 2 L/min  Assess: MEWS Score  MEWS Temp 2  MEWS Systolic 1  MEWS Pulse 1  MEWS RR 0  MEWS LOC 0  MEWS Score 4  MEWS Score Color Red  Assess: if the MEWS score is Yellow or Red  Were vital signs taken at a resting state? Yes  Focused Assessment No change from prior assessment  Early Detection of Sepsis Score *See Row Information* Low  MEWS guidelines implemented *See Row Information* Yes  Treat  MEWS Interventions Administered prn meds/treatments;Escalated (See documentation below)  Pain Scale 0-10  Pain Score 0  Take Vital Signs  Increase Vital Sign Frequency  Red: Q 1hr X 4 then Q 4hr X 4, if remains red, continue Q 4hrs  Escalate  MEWS: Escalate Red: discuss with charge nurse/RN and provider, consider discussing with RRT  Notify: Charge Nurse/RN  Name of Charge Nurse/RN Notified Lilian Coma  Date Charge Nurse/RN Notified 12/13/20  Time Charge Nurse/RN Notified 2209  Notify: Provider  Provider Name/Title Dr. Joneen Roach  Date Provider Notified 12/13/20  Time Provider Notified 2209  Notification Type Page  Notification Reason Change in status  Provider response No new orders  Date of Provider Response 12/13/20  Time of Provider Response 2211  Notify: Rapid Response  Name of Rapid Response RN Notified Mindy  Date Rapid Response Notified 12/13/20  Time Rapid Response Notified 2210

## 2020-12-13 NOTE — Progress Notes (Signed)
A consult was received from an ED physician for Vancomycin & Cefepime per pharmacy dosing.  The patient's profile has been reviewed for ht/wt/allergies/indication/available labs.   A one time order has been placed for Cefepime 2gm & Vancomycin 2gm IV.  Further antibiotics/pharmacy consults should be ordered by admitting physician if indicated.                       Thank you, Junita Push PharmD 12/13/2020  5:48 AM

## 2020-12-13 NOTE — ED Provider Notes (Signed)
[facility]-EMERGENCY DEPT Provider Note   CSN: [id number] Arrival date & time: 12/12/20  2311     History Chief Complaint  Patient presents with   Vomiting   Generalized Body Aches    Dale Macias is a 30 y.o. male with no chronic medical conditions who presents to the emergency department with a chief complaint of vomiting.  The patient reports that 2 nights ago that he developed vomiting, diarrhea.  Diarrhea has been improving since onset.  He has continued to have episodes of vomiting earlier today.  He reports that he has also developed body aches, especially in his calves and thighs, but now is endorsing joint pain.  Reports that his bilateral elbows, hips (L>R), bilateral knees, and neck are aching.  He reports subjective fever and chills, onset tonight.  He is also endorsing a headache behind his bilateral eyes.  He also adds that he is having pleuritic chest pain, which he has been having intermittently for several months.  States the pain will typically last for a day before resolving.  He is currently having the pain, but denies shortness of breath, cough, neck stiffness, diplopia, amaurosis fugax, blurry vision.  Although he denies numbness or weakness, he does state that today when he laid down to take a nap and when he woke up he thought he could not feel his arms and his legs as they were tingling like they were asleep.  He reports that that sensation resolved once he awoke and got up and started moving.  No history of similar.  He does also add that he is having some mild epigastric tenderness.  He is unable to characterize the pain.  He was seen at urgent care for the same yesterday and was given Phenergan for nausea and IV fluids.  Phenergan has helped at home with reducing episodes of vomiting.  The patient does report that he had COVID 1 month ago.  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  No known tick bites,  mosquito bites, or other insect bites.  His wife does report that he did have a rash near his left knee over Labor Day weekend, but this has since resolved.  No other wounds or rashes.  She also adds that patient did have increased alcohol intake for 2 days prior to onset of symptoms.  Reports that he typically drinks socially, but did drink more than a quarter of 1/5 on Monday and Tuesday or prior to onset of his symptoms on Wednesday.  No known sick contacts.  No recent travel.  No suspicious food intake, undercooked seafood, penile or testicular pain or swelling, penile discharge, back pain, dizziness, lightheadedness, dysuria, hematuria, sore throat, nasal congestion.  He does report decreased urine output since onset of symptoms.  His wife reports that he typically voids approximately once every 1-2 hours, but reports that he is only voided twice over the last 24 hours.  No cola or tea colored urine.  The history is provided by the patient and medical records. No language interpreter was used.      History reviewed. No pertinent past medical history.  Patient Active Problem List   Diagnosis Date Noted   SIRS (systemic inflammatory response syndrome) (HCC) 12/13/2020    History reviewed. No pertinent surgical history.     No family history on file.  Social History   Tobacco Use   Smoking status: Some Days    Types: Cigarettes  Smokeless tobacco: Never  Substance Use Topics   Alcohol use: Yes    Comment: occassional   Drug use: Never    Home Medications Prior to Admission medications   Not on File    Allergies    Benzonatate  Review of Systems   Review of Systems  Constitutional:  Positive for chills and fever. Negative for appetite change and diaphoresis.  HENT:  Negative for congestion, sinus pressure, sinus pain and sore throat.   Eyes:  Negative for visual disturbance.  Respiratory:  Negative for cough, shortness of breath and wheezing.   Cardiovascular:   Positive for chest pain. Negative for palpitations.  Gastrointestinal:  Positive for diarrhea, nausea and vomiting. Negative for abdominal pain and constipation.  Endocrine: Negative for polyuria.  Genitourinary:  Positive for decreased urine volume. Negative for dysuria, flank pain, frequency, penile discharge, penile pain and penile swelling.  Musculoskeletal:  Positive for arthralgias, myalgias and neck pain. Negative for back pain, joint swelling and neck stiffness.  Skin:  Negative for color change, rash and wound.  Allergic/Immunologic: Negative for immunocompromised state.  Neurological:  Positive for headaches. Negative for dizziness, seizures, syncope, weakness and numbness.  Psychiatric/Behavioral:  Negative for confusion.    Physical Exam Updated Vital Signs BP 90/62 (BP Location: Left Arm)   Pulse (!) 104   Temp 99.9 F (37.7 C) (Oral)   Resp (!) 27   Ht 5\' 11"  (1.803 m)   Wt 90.7 kg   SpO2 92%   BMI 27.89 kg/m   Physical Exam Vitals and nursing note reviewed.  Constitutional:      Appearance: He is well-developed. He is not diaphoretic.     Comments: Ill-appearing.  Waxy appearing skin.  HENT:     Head: Normocephalic.     Ears:     Comments: Erythema in the bilateral canals.  Normal TMs.  No mastoid tenderness.    Mouth/Throat:     Mouth: Mucous membranes are dry.     Pharynx: No oropharyngeal exudate or posterior oropharyngeal erythema.     Comments: Whitish-gray coating noted to the posterior oropharynx.  No tonsillar edema. Eyes:     General: No scleral icterus.       Right eye: No discharge.        Left eye: No discharge.     Conjunctiva/sclera: Conjunctivae normal.  Neck:     Comments: No meningismus. Cardiovascular:     Rate and Rhythm: Regular rhythm. Tachycardia present.     Pulses: Normal pulses.     Heart sounds: Normal heart sounds. No murmur heard.   No friction rub. No gallop.  Pulmonary:     Effort: Pulmonary effort is normal. No respiratory  distress.     Breath sounds: No stridor. No wheezing, rhonchi or rales.  Chest:     Chest wall: No tenderness.  Abdominal:     General: There is no distension.     Palpations: Abdomen is soft. There is no mass.     Tenderness: There is abdominal tenderness. There is no right CVA tenderness, left CVA tenderness, guarding or rebound.     Hernia: No hernia is present.     Comments: Mild tenderness palpation in the epigastric region and left upper quadrant.  No rebound or guarding.  No CVA tenderness bilaterally.  Negative Murphy sign.  No tenderness over McBurney's point.  Musculoskeletal:     Cervical back: Normal range of motion and neck supple.     Comments: Full active and passive  range of motion of all joints of the upper and lower extremities.  No redness, warmth, or swelling.  Skin:    General: Skin is warm and dry.     Capillary Refill: Capillary refill takes less than 2 seconds.     Coloration: Skin is not jaundiced or pale.     Findings: No bruising, erythema, lesion or rash.     Comments: There is a 2 cm circular area noted to the medial right knee with minimal scaling that is well-healed.  Neurological:     General: No focal deficit present.     Mental Status: He is alert.  Psychiatric:        Behavior: Behavior normal.    ED Results / Procedures / Treatments   Labs (all labs ordered are listed, but only abnormal results are displayed) Labs Reviewed  COMPREHENSIVE METABOLIC PANEL - Abnormal; Notable for the following components:      Result Value   Glucose, Bld 124 (*)    Calcium 8.7 (*)    All other components within normal limits  URINALYSIS, ROUTINE W REFLEX MICROSCOPIC - Abnormal; Notable for the following components:   Color, Urine YELLOW (*)    APPearance CLEAR (*)    Ketones, ur TRACE (*)    All other components within normal limits  C-REACTIVE PROTEIN - Abnormal; Notable for the following components:   CRP 6.5 (*)    All other components within normal limits   TROPONIN I (HIGH SENSITIVITY) - Abnormal; Notable for the following components:   Troponin I (High Sensitivity) 1,614 (*)    All other components within normal limits  RESP PANEL BY RT-PCR (FLU A&B, COVID) ARPGX2  CULTURE, BLOOD (ROUTINE X 2)  CULTURE, BLOOD (ROUTINE X 2)  LIPASE, BLOOD  CBC  MAGNESIUM  SEDIMENTATION RATE  MONONUCLEOSIS SCREEN  PROCALCITONIN  HIV ANTIBODY (ROUTINE TESTING W REFLEX)  LACTIC ACID, PLASMA  LACTIC ACID, PLASMA  TROPONIN I (HIGH SENSITIVITY)    EKG EKG Interpretation  Date/Time:  Saturday December 13 2020 04:02:52 EDT Ventricular Rate:  104 PR Interval:  177 QRS Duration: 87 QT Interval:  277 QTC Calculation: 365 R Axis:   71 Text Interpretation: Sinus tachycardia normal axis no acute ischemia Confirmed by Pieter Partridge ([id number]) on 12/13/2020 4:23:45 AM  Radiology DG Chest 2 View  Result Date: 12/13/2020 CLINICAL DATA:  Shortness of breath EXAM: CHEST - 2 VIEW COMPARISON:  09/12/2009 FINDINGS: Artifact from EKG leads. Normal heart size and mediastinal contours. No acute infiltrate or edema. No effusion or pneumothorax. No acute osseous findings. IMPRESSION: No active cardiopulmonary disease. Electronically Signed   By: Tiburcio Pea M.D.   On: 12/13/2020 06:54    Procedures .Critical Care Performed by: Barkley Boards, PA-C Authorized by: Barkley Boards, PA-C   Critical care provider statement:    Critical care time (minutes):  30   Critical care time was exclusive of:  Separately billable procedures and treating other patients and teaching time   Critical care was necessary to treat or prevent imminent or life-threatening deterioration of the following conditions:  Sepsis   Critical care was time spent personally by me on the following activities:  Ordering and performing treatments and interventions, ordering and review of laboratory studies, ordering and review of radiographic studies, pulse oximetry, re-evaluation of patient's condition,  obtaining history from patient or surrogate, examination of patient, evaluation of patient's response to treatment and development of treatment plan with patient or surrogate   I assumed direction of  critical care for this patient from another provider in my specialty: no     Care discussed with: admitting provider     Medications Ordered in ED Medications  lactated ringers infusion ( Intravenous New Bag/Given 12/13/20 0551)  vancomycin (VANCOREADY) IVPB 2000 mg/400 mL (2,000 mg Intravenous New Bag/Given 12/13/20 0601)  enoxaparin (LOVENOX) injection 40 mg (has no administration in time range)  acetaminophen (TYLENOL) tablet 650 mg (has no administration in time range)    Or  acetaminophen (TYLENOL) suppository 650 mg (has no administration in time range)  ondansetron (ZOFRAN) tablet 4 mg (has no administration in time range)    Or  ondansetron (ZOFRAN) injection 4 mg (has no administration in time range)  sodium chloride 0.9 % bolus 1,000 mL (0 mLs Intravenous Stopped 12/13/20 0324)  ondansetron (ZOFRAN) injection 4 mg (4 mg Intravenous Given 12/13/20 0154)  lactated ringers bolus 1,000 mL (0 mLs Intravenous Stopped 12/13/20 0529)  prochlorperazine (COMPAZINE) injection 10 mg (10 mg Intravenous Given 12/13/20 0331)  diphenhydrAMINE (BENADRYL) injection 25 mg (25 mg Intravenous Given 12/13/20 0335)  lactated ringers bolus 1,000 mL (0 mLs Intravenous Stopped 12/13/20 0529)  ketorolac (TORADOL) 30 MG/ML injection 30 mg (30 mg Intravenous Given 12/13/20 0335)  ceFEPIme (MAXIPIME) 2 g in sodium chloride 0.9 % 100 mL IVPB (0 g Intravenous Stopped 12/13/20 0549)  metroNIDAZOLE (FLAGYL) IVPB 500 mg (0 mg Intravenous Stopped 12/13/20 0657)  acetaminophen (TYLENOL) tablet 650 mg (650 mg Oral Given 12/13/20 0552)  lactated ringers bolus 1,000 mL (1,000 mLs Intravenous New Bag/Given 12/13/20 0617)    ED Course  I have reviewed the triage vital signs and the nursing notes.  Pertinent labs & imaging results  that were available during my care of the patient were reviewed by me and considered in my medical decision making (see chart for details).    MDM Rules/Calculators/A&P                           30 year old male with no chronic medical conditions who presents to the emergency department with a 2-day history of nausea, vomiting, and diarrhea.  He has subsequently developed joint pain, especially in the bilateral elbows, hips, and knees, myalgias, tactile fever, and chills over the last 24 hours.   Mild tachycardia on arrival. BP with 99/64 with MAP of 74 on arrival.  BP slightly downtrending since arrival.  Map is remained above 65.  Patient subsequently became febrile.  The patient was discussed with Dr. Delford Field, attending physician.  Work-up overall reassuring.  No metabolic derangements.  Borderline leukocytosis.  COVID-19 and influenza test are negative.  UA with trace ketonuria, but not concerning for infection.  Chest x-ray is unremarkable.  Symptoms, the patient meets sepsis criteria.  Source is unknown at this time.  Could consider viral source given arthralgias.  Did consider tickborne illness as patient did have a rash several weeks ago on the left, but this would be unusual timing since his symptoms began over the last 48 hours.  However, given the soft blood pressures after receiving a 30 cc/kg bolus with no fever and persistent tachycardia, will plan for admission for occult sepsis.  Blood cultures x2 and lactate have been ordered and pending.  Started on broad-spectrum antibiotics with Flagyl, Maxipime, and vancomycin.  He will require admission for further work-up and evaluation.  Spoke with Dr. Robb Matar who is excepted the patient for admission.  The patient appears reasonably stabilized for admission considering the current  resources, flow, and capabilities available in the ED at this time, and I doubt any other [facility] requiring further screening and/or treatment in the ED prior to  admission.  Final Clinical Impression(s) / ED Diagnoses Final diagnoses:  Fever of unknown origin    Rx / DC Orders ED Discharge Orders     None        Barkley Boards, PA-C 12/13/20 0721    Koleen Distance, MD 12/14/20 0530

## 2020-12-13 NOTE — Progress Notes (Signed)
Pharmacy Antibiotic Note  Dale Macias is a 30 y.o. male admitted on 12/12/2020 with sepsis.  Pharmacy has been consulted for Vancomycin and Cefepime dosing. Loading doses given at [facility] ED 9/17 a.m.  Plan: Cefepime 2gm IV q8h Vancomycin 1500 mg IV Q 12 hrs. Goal AUC 400-550. Expected AUC: 530, SCr used: 1.18 Will f/u renal function, micro data, and pt's clinical condition Vanc levels prn   Height: 5\' 11"  (180.3 cm) Weight: 90.7 kg (200 lb) IBW/kg (Calculated) : 75.3  Temp (24hrs), Avg:100.1 F (37.8 C), Min:99 F (37.2 C), Max:102.3 F (39.1 C)  Recent Labs  Lab 12/13/20 0001 12/13/20 0655  WBC 10.2  --   CREATININE 1.18  --   LATICACIDVEN  --  0.8    Estimated Creatinine Clearance: 106.5 mL/min (by C-G formula based on SCr of 1.18 mg/dL).    Allergies  Allergen Reactions   Benzonatate Swelling    Throat swelling    Antimicrobials this admission: 9/17 Cefepime >>  9/17 Vanc >>   Microbiology results: 9/17 BCx:  9/17 MRSA PCR: negative 9/17 Cdiff PCR:  Thank you for allowing pharmacy to be a part of this patient's care.  10/17, PharmD, BCPS Please see amion for complete clinical pharmacist phone list 12/13/2020 10:20 PM

## 2020-12-13 NOTE — Consult Note (Signed)
CONSULTATION NOTE   Patient Name: Dale Macias Date of Encounter: 12/13/2020 Cardiologist: None Electrophysiologist: None Advanced Heart Failure: None   Chief Complaint   Diarrhea, vomiting, elevated troponin  Patient Profile   30 yo male with no significant past medical history, presents with 3 days of diarrhea, vomiting, fever, chills and fatigue, now found to have significant troponin elevation  HPI   DANNIAL Macias is a 30 y.o. male who is being seen today for the evaluation of elevated troponin at the request of Dr. Robb Matar. This is a 30 year old male who is otherwise healthy who presents with 3 days of diarrhea, vomiting, fever, chills and fatigue was found to have a significant troponin elevation.  He also reports he has been having pleuritic type chest pain which has been intermittent for actually several months.  He had suffered COVID-19 infection about a month ago, but has had the virus 3x - unvacinnated.  Currently, COVID 19 PCR is negative. Sedimentation rate is normal, CRP is elevated. Initial troponin is quite elevated at 1614. Blood cultures are pending. WBC's are not elevated, however, patient meets SIRS/Sepsis criteria. Lactate is fortunately low at 0.8. d-dimer is pending.  I was present in the emergency department for bedside echo.  This demonstrates mild apical hypokinesis to my read with an LVEF around 50%.  This could be consistent with myocarditis.  No pericardial fluid was noted.  PMHx   History reviewed. No pertinent past medical history.  History reviewed. No pertinent surgical history.  FAMHx   No family history on file. No family history of myocarditis.  Grandfather had heart failure  SOCHx    reports that he has been smoking cigarettes. He has never used smokeless tobacco. He reports current alcohol use. He reports that he does not use drugs.  Outpatient Medications   No current facility-administered medications on file prior to encounter.    No current outpatient medications on file prior to encounter.    Inpatient Medications    Scheduled Meds:  enoxaparin (LOVENOX) injection  40 mg Subcutaneous Q24H    Continuous Infusions:  lactated ringers 150 mL/hr at 12/13/20 0551    PRN Meds: acetaminophen **OR** acetaminophen, ondansetron **OR** ondansetron (ZOFRAN) IV   ALLERGIES   Allergies  Allergen Reactions   Benzonatate Swelling    Throat swelling    ROS   Pertinent items noted in HPI and remainder of comprehensive ROS otherwise negative.  Vitals   Vitals:   12/13/20 0540 12/13/20 0658 12/13/20 0745 12/13/20 0830  BP:   91/60 98/65  Pulse:   95 90  Resp:   (!) 24 (!) 27  Temp: (!) 101.1 F (38.4 C) 99.9 F (37.7 C)    TempSrc: Oral Oral    SpO2:   95% 92%  Weight:      Height:       No intake or output data in the 24 hours ending 12/13/20 0855 Filed Weights   12/12/20 2345  Weight: 90.7 kg    Physical Exam   General appearance: alert, no distress, and muscular Neck: no carotid bruit, no JVD, and thyroid not enlarged, symmetric, no tenderness/mass/nodules Lungs: clear to auscultation bilaterally Heart: regular rate and rhythm, S1, S2 normal, and no S3 or S4 Abdomen: soft, non-tender; bowel sounds normal; no masses,  no organomegaly Extremities: extremities normal, atraumatic, no cyanosis or edema Pulses: 2+ and symmetric Skin: Normal skin color, warm to the touch Neurologic: Mental status: Awake but somewhat somnolent at times related to medications.  Psych: Pleasant  Labs   Results for orders placed or performed during the hospital encounter of 12/12/20 (from the past 48 hour(s))  Lipase, blood     Status: None   Collection Time: 12/13/20 12:01 AM  Result Value Ref Range   Lipase 27 11 - 51 U/L    Comment: Performed at [facility], [address], [place], [place] [address]  Comprehensive metabolic panel     Status: Abnormal   Collection Time: 12/13/20 12:01 AM   Result Value Ref Range   Sodium 135 135 - 145 mmol/L   Potassium 4.1 3.5 - 5.1 mmol/L   Chloride 101 98 - 111 mmol/L   CO2 24 22 - 32 mmol/L   Glucose, Bld 124 (H) 70 - 99 mg/dL    Comment: Glucose reference range applies only to samples taken after fasting for at least 8 hours.   BUN 13 6 - 20 mg/dL   Creatinine, Ser 0.93 0.61 - 1.24 mg/dL   Calcium 8.7 (L) 8.9 - 10.3 mg/dL   Total Protein 7.1 6.5 - 8.1 g/dL   Albumin 4.2 3.5 - 5.0 g/dL   AST 38 15 - 41 U/L   ALT 30 0 - 44 U/L   Alkaline Phosphatase 43 38 - 126 U/L   Total Bilirubin 0.8 0.3 - 1.2 mg/dL   GFR, Estimated >26 >71 mL/min    Comment: (NOTE) Calculated using the CKD-EPI Creatinine Equation (2021)    Anion gap 10 5 - 15    Comment: Performed at [facility], [address], [place], [place] [address]  CBC     Status: None   Collection Time: 12/13/20 12:01 AM  Result Value Ref Range   WBC 10.2 4.0 - 10.5 K/uL   RBC 4.90 4.22 - 5.81 MIL/uL   Hemoglobin 14.4 13.0 - 17.0 g/dL   HCT 99.8 33.8 - 25.0 %   MCV 86.9 80.0 - 100.0 fL   MCH 29.4 26.0 - 34.0 pg   MCHC 33.8 30.0 - 36.0 g/dL   RDW 53.9 76.7 - 34.1 %   Platelets 178 150 - 400 K/uL   nRBC 0.0 0.0 - 0.2 %    Comment: Performed at [facility], [address] [place], [place] [address]  Urinalysis, Routine w reflex microscopic Urine, Clean Catch     Status: Abnormal   Collection Time: 12/13/20 12:01 AM  Result Value Ref Range   Color, Urine YELLOW (A) YELLOW   APPearance CLEAR (A) CLEAR   Specific Gravity, Urine 1.010 1.005 - 1.030   pH 6.0 5.0 - 8.0   Glucose, UA NEGATIVE NEGATIVE mg/dL   Hgb urine dipstick NEGATIVE NEGATIVE   Bilirubin Urine NEGATIVE NEGATIVE   Ketones, ur TRACE (A) NEGATIVE mg/dL   Protein, ur NEGATIVE NEGATIVE mg/dL   Nitrite NEGATIVE NEGATIVE   Leukocytes,Ua NEGATIVE NEGATIVE   RBC / HPF 0-5 0 - 5 RBC/hpf   WBC, UA 0-5 0 - 5 WBC/hpf   Bacteria, UA NONE SEEN NONE SEEN   Mucus PRESENT      Comment: Performed at [facility], [address] [place], [place] [address]  Magnesium     Status: None   Collection Time: 12/13/20 12:01 AM  Result Value Ref Range   Magnesium 1.9 1.7 - 2.4 mg/dL    Comment: Performed at [facility], [address] [place], [place] [address]  Resp Panel by RT-PCR (Flu A&B, Covid) Nasopharyngeal Swab  Status: None   Collection Time: 12/13/20  1:40 AM   Specimen: Nasopharyngeal Swab; Nasopharyngeal(NP) swabs in vial transport medium  Result Value Ref Range   SARS Coronavirus 2 by RT PCR NEGATIVE NEGATIVE    Comment: (NOTE) SARS-CoV-2 target nucleic acids are NOT DETECTED.  The SARS-CoV-2 RNA is generally detectable in upper respiratory specimens during the acute phase of infection. The lowest concentration of SARS-CoV-2 viral copies this assay can detect is 138 copies/mL. A negative result does not preclude SARS-Cov-2 infection and should not be used as the sole basis for treatment or other patient management decisions. A negative result may occur with  improper specimen collection/handling, submission of specimen other than nasopharyngeal swab, presence of viral mutation(s) within the areas targeted by this assay, and inadequate number of viral copies(<138 copies/mL). A negative result must be combined with clinical observations, patient history, and epidemiological information. The expected result is Negative.  Fact Sheet for Patients:  BloggerCourse.com  Fact Sheet for Healthcare Providers:  SeriousBroker.it  This test is no t yet approved or cleared by the [place] FDA and  has been authorized for detection and/or diagnosis of SARS-CoV-2 by FDA under an Emergency Use Authorization (EUA). This EUA will remain  in effect (meaning this test can be used) for the duration of the COVID-19 declaration under Section 564(b)(1) of the Act,  21 U.S.C.section 360bbb-3(b)(1), unless the authorization is terminated  or revoked sooner.       Influenza A by PCR NEGATIVE NEGATIVE   Influenza B by PCR NEGATIVE NEGATIVE    Comment: (NOTE) The Xpert Xpress SARS-CoV-2/FLU/RSV plus assay is intended as an aid in the diagnosis of influenza from Nasopharyngeal swab specimens and should not be used as a sole basis for treatment. Nasal washings and aspirates are unacceptable for Xpert Xpress SARS-CoV-2/FLU/RSV testing.  Fact Sheet for Patients: BloggerCourse.com  Fact Sheet for Healthcare Providers: SeriousBroker.it  This test is not yet approved or cleared by the [place] FDA and has been authorized for detection and/or diagnosis of SARS-CoV-2 by FDA under an Emergency Use Authorization (EUA). This EUA will remain in effect (meaning this test can be used) for the duration of the COVID-19 declaration under Section 564(b)(1) of the Act, 21 U.S.C. section 360bbb-3(b)(1), unless the authorization is terminated or revoked.  Performed at [facility], [address], [place], [place] [address]   Troponin I (High Sensitivity)     Status: Abnormal   Collection Time: 12/13/20  6:10 AM  Result Value Ref Range   Troponin I (High Sensitivity) 1,614 (HH) <18 ng/L    Comment: CRITICAL RESULT CALLED TO, READ BACK BY AND VERIFIED WITH: Norberta Keens, RN @ [phone] ON 12/13/2020 BY BROOKS, L (NOTE) Elevated high sensitivity troponin I (hsTnI) values and significant  changes across serial measurements may suggest ACS but many other  chronic and acute conditions are known to elevate hsTnI results.  Refer to the Links section for chest pain algorithms and additional  guidance. Performed at [facility], [address], [place], [place] [address]   Sedimentation rate     Status: None   Collection Time: 12/13/20  6:10 AM  Result Value Ref Range   Sed Rate 9 0  - 16 mm/hr    Comment: Performed at [facility], [address] [place], [place] [address]  C-reactive protein     Status: Abnormal   Collection Time: 12/13/20  6:10 AM  Result Value Ref Range   CRP 6.5 (  H) <1.0 mg/dL    Comment: Performed at [facility], [address], [place], [place] [address]  Mononucleosis screen     Status: None   Collection Time: 12/13/20  6:10 AM  Result Value Ref Range   Mono Screen NEGATIVE NEGATIVE    Comment: Performed at [facility], [address] [place], [place] [address]  Procalcitonin     Status: None   Collection Time: 12/13/20  6:10 AM  Result Value Ref Range   Procalcitonin <0.10 ng/mL    Comment:        Interpretation: PCT (Procalcitonin) <= 0.5 ng/mL: Systemic infection (sepsis) is not likely. Local bacterial infection is possible. (NOTE)       Sepsis PCT Algorithm           Lower Respiratory Tract                                      Infection PCT Algorithm    ----------------------------     ----------------------------         PCT < 0.25 ng/mL                PCT < 0.10 ng/mL          Strongly encourage             Strongly discourage   discontinuation of antibiotics    initiation of antibiotics    ----------------------------     -----------------------------       PCT 0.25 - 0.50 ng/mL            PCT 0.10 - 0.25 ng/mL               OR       >80% decrease in PCT            Discourage initiation of                                            antibiotics      Encourage discontinuation           of antibiotics    ----------------------------     -----------------------------         PCT >= 0.50 ng/mL              PCT 0.26 - 0.50 ng/mL               AND        <80% decrease in PCT             Encourage initiation of                                             antibiotics       Encourage continuation           of antibiotics    ----------------------------      -----------------------------        PCT >= 0.50 ng/mL                  PCT > 0.50 ng/mL               AND  increase in PCT                  Strongly encourage                                      initiation of antibiotics    Strongly encourage escalation           of antibiotics                                     -----------------------------                                           PCT <= 0.25 ng/mL                                                 OR                                        > 80% decrease in PCT                                      Discontinue / Do not initiate                                             antibiotics  Performed at [facility], [address] [place], [place] [address]   Lactic acid, plasma     Status: None   Collection Time: 12/13/20  6:55 AM  Result Value Ref Range   Lactic Acid, Venous 0.8 0.5 - 1.9 mmol/L    Comment: Performed at [facility], [address], [place], [place] [address]  D-dimer, quantitative     Status: None   Collection Time: 12/13/20  8:07 AM  Result Value Ref Range   D-Dimer, Quant 0.36 0.00 - 0.50 ug/mL-FEU    Comment: (NOTE) At the manufacturer cut-off value of 0.5 g/mL FEU, this assay has a negative predictive value of 95-100%.This assay is intended for use in conjunction with a clinical pretest probability (PTP) assessment model to exclude pulmonary embolism (PE) and deep venous thrombosis (DVT) in outpatients suspected of PE or DVT. Results should be correlated with clinical presentation. Performed at [facility], [address], [place], [place] [address]     ECG   Sinus tachycardia at 104, T wave inversion in lead III- Personally Reviewed  Telemetry   Sinus tachycardia- Personally Reviewed  Radiology   DG Chest 2 View  Result Date: 12/13/2020 CLINICAL DATA:  Shortness of breath EXAM: CHEST - 2 VIEW COMPARISON:  09/12/2009 FINDINGS:  Artifact from EKG leads. Normal heart size and mediastinal contours. No acute infiltrate or edema. No effusion or pneumothorax. No acute osseous findings. IMPRESSION: No active cardiopulmonary disease. Electronically Signed  By: Tiburcio Pea M.D.   On: 12/13/2020 06:54    Cardiac Studies   Echo pending  Impression   Active Problems:   SIRS (systemic inflammatory response syndrome) (HCC)   Febrile illness   Myocarditis [facility])   Recommendation   Mr. Brands has had several days of nausea, vomiting, fever and inability to keep fluids down.  He is noted to be hypotensive and likely is volume depleted.  Troponin was markedly elevated more consistent with a probable myocarditis in this young individual.  He had diffuse and somewhat pleuritic chest pain.  He did have COVID-19 for the third time a little over a month ago which was treated supportively.  He had not been vaccinated.  His wife said he had gotten sick after eating [employer] recently.  Unclear if that is related.  Either way, his symptoms are suggestive of a myocarditis.  He is CRP is elevated but the sedimentation rate was not.  White blood cell counts are normal.  He is getting antibiotics and blood cultures are pending.  He still having some fevers.  Echo demonstrates low normal LVEF with possible apical hypokinesis.  Would recommend trending his troponins.  As this is probably of viral myocarditis, would not recommend heparinization at this time.  Trend troponin and treat supportively.  Would likely recommend cMRI when he is improved or more stable. Given his fever and hypotension, he may be at risk for fulminant myocarditis - would recommend transfer to [facility] and closer monitoring in cardiac stepdown (2C) if possible.  Thanks for the consultation. Cardiology will follow with you.  CRITICAL CARE TIME: I have spent a total of 45 minutes with patient reviewing hospital notes, telemetry, EKGs, labs and examining the patient as  well as establishing an assessment and plan that was discussed with the patient.  > 50% of time was spent in direct patient care. The patient is critically ill with multi-organ system failure and requires high complexity decision making for assessment and support, frequent evaluation and titration of therapies, application of advanced monitoring technologies and extensive interpretation of multiple databases.   Length of Stay:  LOS: 0 days   Chrystie Nose, MD, [facility], FACP  [place]  [facility]  Medical Director of the [facility] Lip[facility]  Cardiovascular Risk Reduction Clinic Diplomate of the American Board of Clinical Lipidology Attending Cardiologist  Direct Dial: [phone]  Fax: [phone]  Website:  [email or web address] 12/13/2020, 8:55 AM

## 2020-12-13 NOTE — Progress Notes (Signed)
Temp 103, troponin to 9,714.  Spoke with Dr. Gala Romney.  Orders received and carried out.  Will continue to monitor pt.

## 2020-12-14 ENCOUNTER — Inpatient Hospital Stay (HOSPITAL_COMMUNITY): Payer: BC Managed Care – PPO

## 2020-12-14 DIAGNOSIS — I514 Myocarditis, unspecified: Secondary | ICD-10-CM

## 2020-12-14 DIAGNOSIS — I400 Infective myocarditis: Secondary | ICD-10-CM | POA: Diagnosis not present

## 2020-12-14 LAB — COMPREHENSIVE METABOLIC PANEL
ALT: 26 U/L (ref 0–44)
AST: 57 U/L — ABNORMAL HIGH (ref 15–41)
Albumin: 2.8 g/dL — ABNORMAL LOW (ref 3.5–5.0)
Alkaline Phosphatase: 30 U/L — ABNORMAL LOW (ref 38–126)
Anion gap: 8 (ref 5–15)
BUN: 9 mg/dL (ref 6–20)
CO2: 23 mmol/L (ref 22–32)
Calcium: 7.9 mg/dL — ABNORMAL LOW (ref 8.9–10.3)
Chloride: 105 mmol/L (ref 98–111)
Creatinine, Ser: 1.23 mg/dL (ref 0.61–1.24)
GFR, Estimated: >60 mL/min (ref >60)
Glucose, Bld: 123 mg/dL — ABNORMAL HIGH (ref 70–99)
Potassium: 3.4 mmol/L — ABNORMAL LOW (ref 3.5–5.1)
Sodium: 136 mmol/L (ref 135–145)
Total Bilirubin: 0.8 mg/dL (ref 0.3–1.2)
Total Protein: 5.2 g/dL — ABNORMAL LOW (ref 6.5–8.1)

## 2020-12-14 LAB — CBC WITH DIFFERENTIAL/PLATELET
Abs Immature Granulocytes: 0.03 10*3/uL (ref 0.00–0.07)
Basophils Absolute: 0 10*3/uL (ref 0.0–0.1)
Basophils Relative: 0 %
Eosinophils Absolute: 0 10*3/uL (ref 0.0–0.5)
Eosinophils Relative: 0 %
HCT: 35.3 % — ABNORMAL LOW (ref 39.0–52.0)
Hemoglobin: 12.2 g/dL — ABNORMAL LOW (ref 13.0–17.0)
Immature Granulocytes: 0 %
Lymphocytes Relative: 9 %
Lymphs Abs: 1 10*3/uL (ref 0.7–4.0)
MCH: 29.6 pg (ref 26.0–34.0)
MCHC: 34.6 g/dL (ref 30.0–36.0)
MCV: 85.7 fL (ref 80.0–100.0)
Monocytes Absolute: 1.2 10*3/uL — ABNORMAL HIGH (ref 0.1–1.0)
Monocytes Relative: 12 %
Neutro Abs: 8.3 10*3/uL — ABNORMAL HIGH (ref 1.7–7.7)
Neutrophils Relative %: 79 %
Platelets: 161 10*3/uL (ref 150–400)
RBC: 4.12 MIL/uL — ABNORMAL LOW (ref 4.22–5.81)
RDW: 12.2 % (ref 11.5–15.5)
WBC: 10.6 10*3/uL — ABNORMAL HIGH (ref 4.0–10.5)
nRBC: 0 % (ref 0.0–0.2)

## 2020-12-14 LAB — CK: Total CK: 410 U/L — ABNORMAL HIGH (ref 49–397)

## 2020-12-14 LAB — TROPONIN I (HIGH SENSITIVITY): Troponin I (High Sensitivity): 3622 ng/L (ref <18)

## 2020-12-14 IMAGING — MR MR CARD MORPHOLOGY WO/W CM
45 of 48 series · 45 of 48 positions shown · IV contrast (Gadavist)
Comparison: none

CLINICAL DATA: Acute myopericarditis

EXAM:
CARDIAC MRI
TECHNIQUE: The patient was scanned on a 1.5 Tesla GE magnet. A dedicated
cardiac coil was used. Functional imaging was done using Fiesta
sequences. [DATE], and 4 chamber views were done to assess for RWMA's.
Modified THAM rule using a short axis stack was used to
calculate an ejection fraction on a dedicated work station using
Circle software. The patient received 8 cc of Gadavist. After 10
minutes inversion recovery sequences were used to assess for
infiltration and scar tissue.

[Series 4: [id number]_bh · axial · 8.0mm · 1.45mm/px · 1 of 16 slices shown]
[im 1/16]
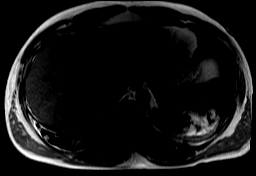

[Series 8: bSSFP · oblique · 8.0mm · 1.65mm/px · 1 of 25 slices shown (1 of 22)]
[im 1/25]
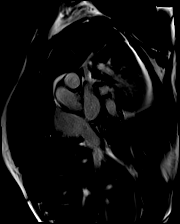

[Series 9: bSSFP · oblique · 8.0mm · 1.65mm/px · 1 of 25 slices shown (2 of 22)]
[im 1/25]
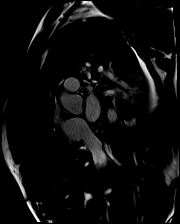

[Series 10: bSSFP · oblique · 8.0mm · 1.65mm/px · 1 of 25 slices shown (3 of 22)]
[im 1/25]
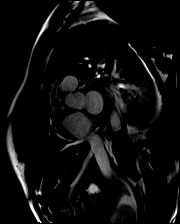

[Series 11: bSSFP · oblique · 8.0mm · 1.65mm/px · 1 of 25 slices shown (4 of 22)]
[im 1/25]
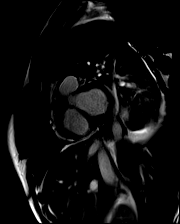

[Series 12: bSSFP · oblique · 8.0mm · 1.65mm/px · 1 of 25 slices shown (5 of 22)]
[im 1/25]
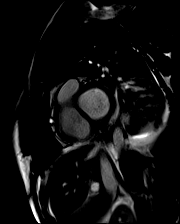

[Series 13: bSSFP · oblique · 8.0mm · 1.65mm/px · 1 of 25 slices shown (6 of 22)]
[im 1/25]
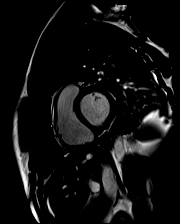

[Series 14: bSSFP · oblique · 8.0mm · 1.65mm/px · 1 of 25 slices shown (7 of 22)]
[im 1/25]
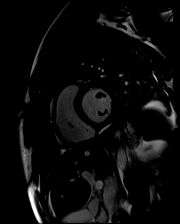

[Series 15: bSSFP · oblique · 8.0mm · 1.65mm/px · 1 of 25 slices shown (8 of 22)]
[im 1/25]
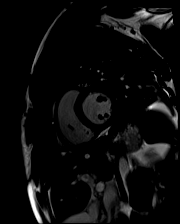

[Series 16: bSSFP · oblique · 8.0mm · 1.65mm/px · 1 of 25 slices shown (9 of 22)]
[im 1/25]
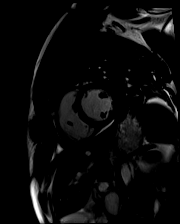

[Series 17: bSSFP · oblique · 8.0mm · 1.65mm/px · 1 of 25 slices shown (10 of 22)]
[im 1/25]
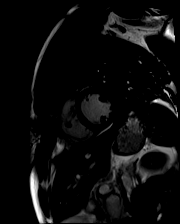

[Series 18: bSSFP · oblique · 8.0mm · 1.65mm/px · 1 of 25 slices shown (11 of 22)]
[im 1/25]
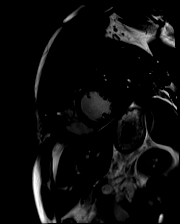

[Series 19: bSSFP · oblique · 8.0mm · 1.65mm/px · 1 of 25 slices shown (12 of 22)]
[im 1/25]
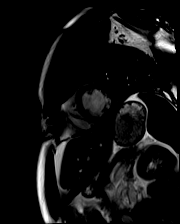

[Series 20: bSSFP · oblique · 8.0mm · 1.65mm/px · 1 of 25 slices shown (13 of 22)]
[im 1/25]
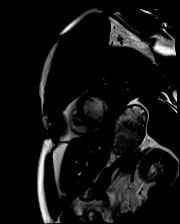

[Series 21: bSSFP · oblique · 8.0mm · 1.65mm/px · 1 of 25 slices shown (14 of 22)]
[im 1/25]
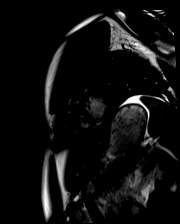

[Series 22: bSSFP · oblique · 8.0mm · 1.65mm/px · 1 of 25 slices shown (15 of 22)]
[im 1/25]
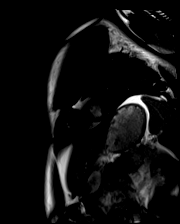

[Series 23: bSSFP · oblique · 8.0mm · 1.65mm/px · 1 of 25 slices shown (16 of 22)]
[im 1/25]
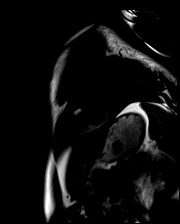

[Series 24: bSSFP · oblique · 8.0mm · 1.65mm/px · 1 of 25 slices shown (17 of 22)]
[im 1/25]
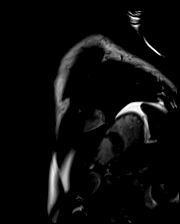

[Series 25: bSSFP · oblique · 8.0mm · 1.65mm/px · 1 of 25 slices shown (18 of 22)]
[im 1/25]
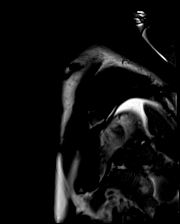

[Series 26: STIR · oblique · 8.0mm · 1.97mm/px · 1 of 17 slices shown]
[im 1/17]
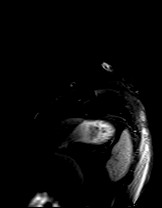

[Series 27: (id)_long_t1 · oblique · 8.0mm · 2.08mm/px · 1 of 24 slices shown]
[im 1/24]
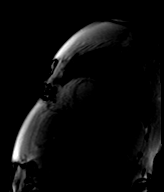

[Series 28: (id)_long_t1_moco · oblique · 8.0mm · 2.08mm/px · 1 of 24 slices shown]
[im 1/24]
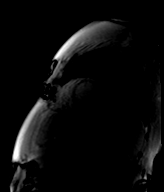

[Series 29: (id)_long_t1_moco_t1 · oblique · 8.0mm · 2.08mm/px · 1 of 3 slices shown (1 of 2)]
[im 1/3]
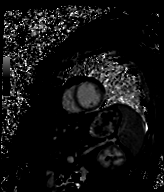

[Series 29: (id)_long_t1_moco_t1 · 1 of 3 slices shown (2 of 2)]
[im 1/3]
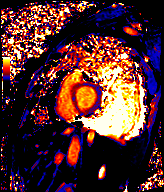

[Series 31: (id)_trufi · oblique · 8.0mm · 2.08mm/px · 1 of 9 slices shown]
[im 1/9]
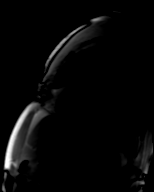

[Series 32: (id)_trufi_moco · oblique · 8.0mm · 2.08mm/px · 1 of 9 slices shown]
[im 1/9]
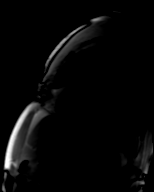

[Series 33: (id)_trufi_moco_t2 · oblique · 8.0mm · 2.08mm/px · 1 of 3 slices shown]
[im 1/3]
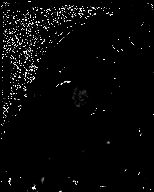

[Series 35: bSSFP · oblique · 6.0mm · 1.41mm/px · 1 of 25 slices shown (19 of 22)]
[im 1/25]
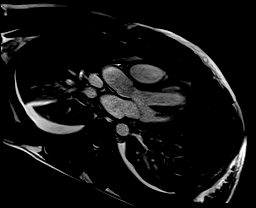

[Series 36: bSSFP · oblique · 6.0mm · 1.41mm/px · 1 of 25 slices shown (20 of 22)]
[im 1/25]
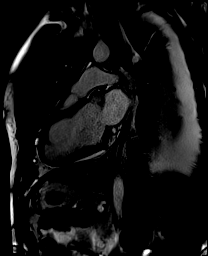

[Series 37: bSSFP · oblique · 6.0mm · 1.45mm/px · 1 of 25 slices shown (21 of 22)]
[im 1/25]
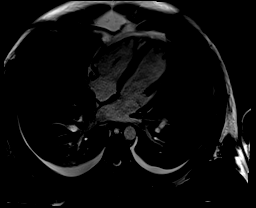

[Series 38: pre short axis · oblique · non-contrast · 8.0mm · 2.25mm/px · 1 of 10 slices shown (1 of 6)]
[im 1/10]
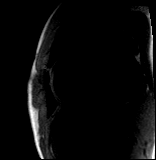

[Series 39: pre short axis · oblique · non-contrast · 8.0mm · 2.25mm/px · 1 of 10 slices shown (2 of 6)]
[im 1/10]
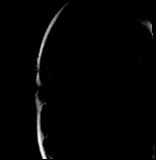

[Series 40: pre short axis · oblique · non-contrast · 8.0mm · 2.25mm/px · 1 of 10 slices shown (3 of 6)]
[im 1/10]
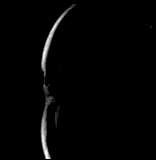

[Series 41: pre short axis · oblique · non-contrast · 8.0mm · 2.25mm/px · 1 of 10 slices shown (4 of 6)]
[im 1/10]
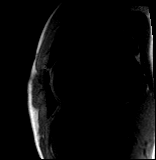

[Series 42: pre short axis · oblique · non-contrast · 8.0mm · 2.25mm/px · 1 of 10 slices shown (5 of 6)]
[im 1/10]
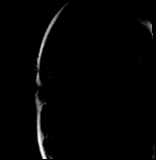

[Series 43: pre short axis · oblique · non-contrast · 8.0mm · 2.25mm/px · 1 of 10 slices shown (6 of 6)]
[im 1/10]
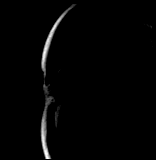

[Series 44: rest short axis · oblique · 8.0mm · 2.25mm/px · 1 of 60 slices shown (1 of 5)]
[im 1/60]
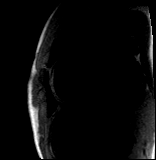

[Series 45: rest short axis · oblique · 8.0mm · 2.25mm/px · 1 of 60 slices shown (2 of 5)]
[im 1/60]
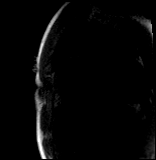

[Series 46: rest short axis · oblique · 8.0mm · 2.25mm/px · 1 of 60 slices shown (3 of 5)]
[im 1/60]
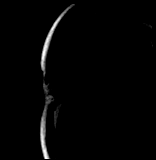

[Series 47: rest short axis · oblique · 8.0mm · 2.25mm/px · 1 of 60 slices shown (4 of 5)]
[im 1/60]
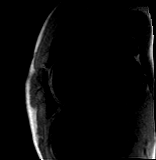

[Series 48: rest short axis · oblique · 8.0mm · 2.25mm/px · 1 of 60 slices shown (5 of 5)]
[im 1/60]
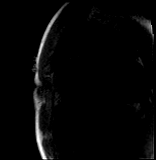

[Series 50: bSSFP · coronal · 6.0mm · 1.41mm/px · 1 of 25 slices shown (22 of 22)]
[im 1/25]
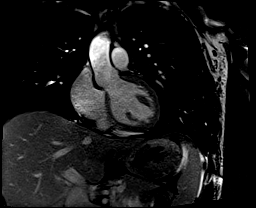

[Series 51: aortic valve cine · axial · 6.0mm · 1.41mm/px · 1 of 25 slices shown]
[im 1/25]
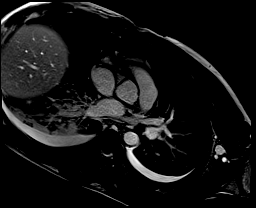

[Series 53: cine rvot · sagittal · 6.0mm · 1.41mm/px · 1 of 25 slices shown]
[im 1/25]
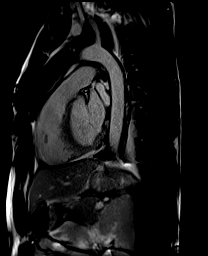

[Series 54: cine rvit · oblique · 6.0mm · 1.41mm/px · 1 of 25 slices shown]
[im 1/25]
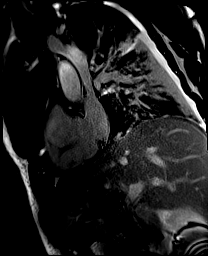

[45 of 48 positions shown; findings below may reference images not displayed]

FINDINGS: Small bilateral pleural effusions.  Bibasilar airspace disease R>L.

The pericardium does not appear thickened, no pericardial effusion
noted. Normal left ventricular size and wall thickness, mild diffuse
hypokinesis with EF 49%. T2 signal reaches 55 (elevated) in the
anterior wall (normal/<54 other walls). Extracellular volume by T1
analysis 30% (mildly elevated). Normal right ventricular size and
systolic function, EF 45%. Normal left and right atrial sizes. First
pass perfusion appear normal.

On delayed enhancement imaging, there was no myocardial late
gadolinium enhancement (LGE) noted. However, the pericardium
adjacent to the anterior wall of the LV appeared enhanced.

MEASUREMENTS:
MEASUREMENTS
LVEDV 191 mL
LVSV 94 mL
LVEF 49%

RVEDV 215 mL
RVSV 97 mL
RVEF 45%

T2 anterior wall 55

ECV by T1 analysis 30%
IMPRESSION: 1.  Normal LV size with mild diffuse hypokinesis, EF 49%.

2.  Normal RV size and systolic function, EF 45%.

3. No myocardial LGE, but there does appear to be some enhancement
of the pericardium adjacent to the anterior wall. I do not see a
pericardial effusion and the pericardium does not appear thickened.

4. Mildly elevated T2 signal in the anterior wall, borderline
elevated ECV measurement using T1 analysis.

This study could be consistent with myopericarditis.

THAM

## 2020-12-14 MED ORDER — BISOPROLOL FUMARATE 5 MG PO TABS
2.5000 mg | ORAL_TABLET | Freq: Every day | ORAL | Status: DC
  Administered 2020-12-14 – 2020-12-16 (×3): 2.5 mg via ORAL
  Filled 2020-12-14 (×3): qty 0.5

## 2020-12-14 MED ORDER — PANTOPRAZOLE SODIUM 40 MG PO TBEC
40.0000 mg | DELAYED_RELEASE_TABLET | Freq: Every day | ORAL | Status: DC
  Administered 2020-12-15: 40 mg via ORAL
  Filled 2020-12-14 (×2): qty 1

## 2020-12-14 MED ORDER — ASPIRIN 325 MG PO TABS
650.0000 mg | ORAL_TABLET | Freq: Three times a day (TID) | ORAL | Status: DC
  Administered 2020-12-14 – 2020-12-16 (×6): 650 mg via ORAL
  Filled 2020-12-14 (×6): qty 2

## 2020-12-14 MED ORDER — GADOBUTROL 1 MMOL/ML IV SOLN
12.0000 mL | Freq: Once | INTRAVENOUS | Status: AC | PRN
  Administered 2020-12-14: 12 mL via INTRAVENOUS

## 2020-12-14 MED ORDER — POTASSIUM CHLORIDE CRYS ER 20 MEQ PO TBCR
20.0000 meq | EXTENDED_RELEASE_TABLET | Freq: Two times a day (BID) | ORAL | Status: AC
  Administered 2020-12-14 – 2020-12-15 (×4): 20 meq via ORAL
  Filled 2020-12-14 (×4): qty 1

## 2020-12-14 NOTE — Progress Notes (Signed)
PROGRESS NOTE    Dale Macias  JGG:[id number] DOB: 1991/03/24 DOA: 12/12/2020 PCP: Pcp, No    Brief Narrative:  30 year old gentleman with no medical history presented with diarrhea vomiting whole body ache and pleuritic chest pain for 3 days.  In the emergency room he was hypotensive and febrile with temperature 103.  COVID-19 and influenza negative.  Initial troponin 1600 and elevated to 9000 with negative EKG.  Sepsis protocol was initiated, stabilized and admitted to the hospital.   Assessment & Plan:   Active Problems:   SIRS (systemic inflammatory response syndrome) (HCC)   Febrile illness   Myocarditis (HCC)  SIRS with febrile illness: Likely viral pericarditis/myocarditis.   No evidence of bacterial infection.  Adequately resuscitated and clinically stabilizing.  Received antibiotics for 24 hours with vancomycin and cefepime.  Blood cultures negative so far. Will discontinue antibiotics in favor of viral infection and monitor.  Elevated troponins with suspected myopericarditis: COVID-19 and influenza negative. Viral panel negative. Clinically stabilizing and symptoms are improving. On NSAIDs and colchicine. Followed by cardiology. Cardiac MRI today.  Acute systolic congestive heart failure: Likely due to myopericarditis.  Ejection fraction 45%.  Euvolemic on exam.  Troponin 9000. On high-dose aspirin.  Started on Aldactone.  Followed by cardiology. Will need repeat echocardiogram within the next few weeks. Hypokalemia, replace potassium.    DVT prophylaxis: enoxaparin (LOVENOX) injection 40 mg Start: 12/13/20 1000   Code Status: Full code Family Communication: None Disposition Plan: Status is: Inpatient  Remains inpatient appropriate because:Inpatient level of care appropriate due to severity of illness  Dispo: The patient is from: Home              Anticipated d/c is to: Home              Patient currently is not medically stable to d/c.   Difficult to  place patient No         Consultants:  Cardiology  Procedures:  Cardiology  Antimicrobials:  Vancomycin and cefepime 9/17-9/18   Subjective: Patient seen and examined.  Today, he feels much better.  Denies any body ache or joint pain.  No more diarrhea.  No more nausea or vomiting.  Last bowel movements more than 24 hours ago. Temperature maximum 103 last 24 hours, low-grade temperature overnight. Only has some precordial pain on deep breathing. On room air.  Eager to get up and take shower.  Telemetry without any events.  Objective: Vitals:   12/14/20 0100 12/14/20 0344 12/14/20 0700 12/14/20 0800  BP: (!) 101/59 97/64 96/69  94/67  Pulse: (!) 103 85 83 83  Resp: (!) 22 20 20 16   Temp: (!) 100.4 F (38 C) 98.2 F (36.8 C) 98.2 F (36.8 C)   TempSrc: Oral Oral Oral   SpO2: 94% 93% 95% 94%  Weight:      Height:        Intake/Output Summary (Last 24 hours) at 12/14/2020 1025 Last data filed at 12/14/2020 0800 Gross per 24 hour  Intake 741.52 ml  Output 2750 ml  Net -2008.48 ml   Filed Weights   12/12/20 2345  Weight: 90.7 kg    Examination:  General exam: Appears calm and comfortable  Respiratory system: Clear to auscultation. Respiratory effort normal. Cardiovascular system: S1 & S2 heard, RRR. No JVD, murmurs, rubs, gallops or clicks. No pedal edema. Gastrointestinal system: Abdomen is nondistended, soft and nontender. No organomegaly or masses felt. Normal bowel sounds heard. Central nervous system: Alert and oriented. No focal neurological  deficits. Extremities: Symmetric 5 x 5 power. Skin: No rashes, lesions or ulcers.  No joint effusions or swelling. Psychiatry: Judgement and insight appear normal. Mood & affect appropriate.     Data Reviewed: I have personally reviewed following labs and imaging studies  CBC: Recent Labs  Lab 12/13/20 0001 12/14/20 0013  WBC 10.2 10.6*  NEUTROABS  --  8.3*  HGB 14.4 12.2*  HCT 42.6 35.3*  MCV 86.9 85.7   PLT 178 161   Basic Metabolic Panel: Recent Labs  Lab 12/13/20 0001 12/14/20 0013  NA 135 136  K 4.1 3.4*  CL 101 105  CO2 24 23  GLUCOSE 124* 123*  BUN 13 9  CREATININE 1.18 1.23  CALCIUM 8.7* 7.9*  MG 1.9  --    GFR: Estimated Creatinine Clearance: 102.2 mL/min (by C-G formula based on SCr of 1.23 mg/dL). Liver Function Tests: Recent Labs  Lab 12/13/20 0001 12/14/20 0013  AST 38 57*  ALT 30 26  ALKPHOS 43 30*  BILITOT 0.8 0.8  PROT 7.1 5.2*  ALBUMIN 4.2 2.8*   Recent Labs  Lab 12/13/20 0001  LIPASE 27   No results for input(s): AMMONIA in the last 168 hours. Coagulation Profile: No results for input(s): INR, PROTIME in the last 168 hours. Cardiac Enzymes: Recent Labs  Lab 12/13/20 0807 12/14/20 0013  CKTOTAL 147 410*   BNP (last 3 results) No results for input(s): PROBNP in the last 8760 hours. HbA1C: No results for input(s): HGBA1C in the last 72 hours. CBG: No results for input(s): GLUCAP in the last 168 hours. Lipid Profile: No results for input(s): CHOL, HDL, LDLCALC, TRIG, CHOLHDL, LDLDIRECT in the last 72 hours. Thyroid Function Tests: No results for input(s): TSH, T4TOTAL, FREET4, T3FREE, THYROIDAB in the last 72 hours. Anemia Panel: No results for input(s): VITAMINB12, FOLATE, FERRITIN, TIBC, IRON, RETICCTPCT in the last 72 hours. Sepsis Labs: Recent Labs  Lab 12/13/20 0610 12/13/20 0655  PROCALCITON <0.10  --   LATICACIDVEN  --  0.8    Recent Results (from the past 240 hour(s))  Resp Panel by RT-PCR (Flu A&B, Covid) Nasopharyngeal Swab     Status: None   Collection Time: 12/13/20  1:40 AM   Specimen: Nasopharyngeal Swab; Nasopharyngeal(NP) swabs in vial transport medium  Result Value Ref Range Status   SARS Coronavirus 2 by RT PCR NEGATIVE NEGATIVE Final    Comment: (NOTE) SARS-CoV-2 target nucleic acids are NOT DETECTED.  The SARS-CoV-2 RNA is generally detectable in upper respiratory specimens during the acute phase of  infection. The lowest concentration of SARS-CoV-2 viral copies this assay can detect is 138 copies/mL. A negative result does not preclude SARS-Cov-2 infection and should not be used as the sole basis for treatment or other patient management decisions. A negative result may occur with  improper specimen collection/handling, submission of specimen other than nasopharyngeal swab, presence of viral mutation(s) within the areas targeted by this assay, and inadequate number of viral copies(<138 copies/mL). A negative result must be combined with clinical observations, patient history, and epidemiological information. The expected result is Negative.  Fact Sheet for Patients:  BloggerCourse.com  Fact Sheet for Healthcare Providers:  SeriousBroker.it  This test is no t yet approved or cleared by the [place] FDA and  has been authorized for detection and/or diagnosis of SARS-CoV-2 by FDA under an Emergency Use Authorization (EUA). This EUA will remain  in effect (meaning this test can be used) for the duration of the COVID-19 declaration under Section  564(b)(1) of the Act, 21 U.S.C.section 360bbb-3(b)(1), unless the authorization is terminated  or revoked sooner.       Influenza A by PCR NEGATIVE NEGATIVE Final   Influenza B by PCR NEGATIVE NEGATIVE Final    Comment: (NOTE) The Xpert Xpress SARS-CoV-2/FLU/RSV plus assay is intended as an aid in the diagnosis of influenza from Nasopharyngeal swab specimens and should not be used as a sole basis for treatment. Nasal washings and aspirates are unacceptable for Xpert Xpress SARS-CoV-2/FLU/RSV testing.  Fact Sheet for Patients: BloggerCourse.com  Fact Sheet for Healthcare Providers: SeriousBroker.it  This test is not yet approved or cleared by the [place] FDA and has been authorized for detection and/or diagnosis of SARS-CoV-2  by FDA under an Emergency Use Authorization (EUA). This EUA will remain in effect (meaning this test can be used) for the duration of the COVID-19 declaration under Section 564(b)(1) of the Act, 21 U.S.C. section 360bbb-3(b)(1), unless the authorization is terminated or revoked.  Performed at [facility], [address] [place], [place] [address]   Blood culture (routine x 2)     Status: None (Preliminary result)   Collection Time: 12/13/20  5:25 AM   Specimen: BLOOD  Result Value Ref Range Status   Specimen Description   Final    BLOOD RIGHT ANTECUBITAL Performed at [facility], [address], [place], [place] [address]    Special Requests   Final    BOTTLES DRAWN AEROBIC AND ANAEROBIC Blood Culture adequate volume Performed at [facility], [address], [place], [place] [address]    Culture   Final    NO GROWTH < 24 HOURS Performed at [facility], [address] [place], [place] [address]    Report Status PENDING  Incomplete  Blood culture (routine x 2)     Status: None (Preliminary result)   Collection Time: 12/13/20  5:35 AM   Specimen: BLOOD  Result Value Ref Range Status   Specimen Description   Final    BLOOD LEFT ANTECUBITAL Performed at [facility], [address], [place], [place] [address]    Special Requests   Final    BOTTLES DRAWN AEROBIC AND ANAEROBIC Blood Culture adequate volume Performed at [facility], [address] [place], [place] [address]    Culture   Final    NO GROWTH < 24 HOURS Performed at [facility], [address], [place], [place] [address]    Report Status PENDING  Incomplete  MRSA Next Gen by PCR, Nasal     Status: None   Collection Time: 12/13/20  4:33 PM   Specimen: Nasal Mucosa; Nasal Swab  Result Value Ref Range Status   MRSA by PCR Next Gen NOT DETECTED NOT DETECTED Final    Comment: (NOTE) The GeneXpert MRSA  Assay (FDA approved for NASAL specimens only), is one component of a comprehensive MRSA colonization surveillance program. It is not intended to diagnose MRSA infection nor to guide or monitor treatment for MRSA infections. Test performance is not FDA approved in patients less than 30 years old. Performed at [facility], [address], [place], [place] [address]   Respiratory (~20 pathogens) panel by PCR     Status: None   Collection Time: 12/13/20  8:00 PM   Specimen: Nasopharyngeal Swab; Respiratory  Result Value Ref Range Status   Adenovirus NOT DETECTED NOT DETECTED Final   Coronavirus 229E NOT DETECTED NOT  DETECTED Final    Comment: (NOTE) The Coronavirus on the Respiratory Panel, DOES NOT test for the novel  Coronavirus (2019 nCoV)    Coronavirus HKU1 NOT DETECTED NOT DETECTED Final   Coronavirus NL63 NOT DETECTED NOT DETECTED Final   Coronavirus OC43 NOT DETECTED NOT DETECTED Final   Metapneumovirus NOT DETECTED NOT DETECTED Final   Rhinovirus / Enterovirus NOT DETECTED NOT DETECTED Final   Influenza A NOT DETECTED NOT DETECTED Final   Influenza B NOT DETECTED NOT DETECTED Final   Parainfluenza Virus 1 NOT DETECTED NOT DETECTED Final   Parainfluenza Virus 2 NOT DETECTED NOT DETECTED Final   Parainfluenza Virus 3 NOT DETECTED NOT DETECTED Final   Parainfluenza Virus 4 NOT DETECTED NOT DETECTED Final   Respiratory Syncytial Virus NOT DETECTED NOT DETECTED Final   Bordetella pertussis NOT DETECTED NOT DETECTED Final   Bordetella Parapertussis NOT DETECTED NOT DETECTED Final   Chlamydophila pneumoniae NOT DETECTED NOT DETECTED Final   Mycoplasma pneumoniae NOT DETECTED NOT DETECTED Final    Comment: Performed at [facility], [address], [place], [place] [address]         Radiology Studies: DG Chest 2 View  Result Date: 12/13/2020 CLINICAL DATA:  Shortness of breath EXAM: CHEST - 2 VIEW COMPARISON:  09/12/2009 FINDINGS: Artifact from EKG leads. Normal  heart size and mediastinal contours. No acute infiltrate or edema. No effusion or pneumothorax. No acute osseous findings. IMPRESSION: No active cardiopulmonary disease. Electronically Signed   By: Tiburcio Pea M.D.   On: 12/13/2020 06:54   ECHOCARDIOGRAM COMPLETE  Result Date: 12/13/2020    ECHOCARDIOGRAM REPORT   Patient Name:   Dale Macias Date of Exam: 12/13/2020 Medical Rec #:  [id number]          Height:       71.0 in Accession #:    [id number]         Weight:       200.0 lb Date of Birth:  Mar 19, 1991          BSA:          2.109 m Patient Age:    30 years           BP:           98/68 mmHg Patient Gender: M                  HR:           94 bpm. Exam Location:  Inpatient Procedure: 2D Echo, Cardiac Doppler, Color Doppler and 3D Echo STAT ECHO Indications:    Elevated troponin. Chest pain  History:        Patient has no prior history of Echocardiogram examinations.                 Signs/Symptoms:Shortness of Breath and Chest Pain. Has had Covid                 19 three times, unvaccinated. Diarhea.  Sonographer:    Roosvelt Maser RDCS Referring Phys: [id number] Teddy Spike IMPRESSIONS  1. Left ventricular ejection fraction, by estimation, is approximately 45%. The left ventricle has mildly decreased function. The left ventricle demonstrates regional wall motion abnormalities (see scoring diagram/findings for description). There is mild left ventricular hypertrophy. Left ventricular diastolic parameters were normal.  2. Right ventricular systolic function is normal. The right ventricular size is normal. Tricuspid regurgitation signal is inadequate for assessing PA pressure.  3. The mitral valve  is grossly normal. Trivial mitral valve regurgitation.  4. The aortic valve is tricuspid. Aortic valve regurgitation is not visualized. No aortic stenosis is present. Aortic valve mean gradient measures 2.0 mmHg.  5. The inferior vena cava is normal in size with <50% respiratory variability, suggesting right  atrial pressure of 8 mmHg. Comparison(s): No prior Echocardiogram. FINDINGS  Left Ventricle: Left ventricular ejection fraction, by estimation, is 45%. The left ventricle has mildly decreased function. The left ventricle demonstrates regional wall motion abnormalities. The left ventricular internal cavity size was normal in size. There is mild left ventricular hypertrophy. Left ventricular diastolic parameters were normal.  LV Wall Scoring: The mid and distal anterior septum and apex are hypokinetic. The entire anterior wall, entire lateral wall, entire inferior wall, basal anteroseptal segment, mid inferoseptal segment, and basal inferoseptal segment are normal. Right Ventricle: The right ventricular size is normal. No increase in right ventricular wall thickness. Right ventricular systolic function is normal. Tricuspid regurgitation signal is inadequate for assessing PA pressure. Left Atrium: Left atrial size was normal in size. Right Atrium: Right atrial size was normal in size. Pericardium: There is no evidence of pericardial effusion. Mitral Valve: The mitral valve is grossly normal. Trivial mitral valve regurgitation. Tricuspid Valve: The tricuspid valve is grossly normal. Tricuspid valve regurgitation is trivial. Aortic Valve: The aortic valve is tricuspid. Aortic valve regurgitation is not visualized. No aortic stenosis is present. Aortic valve mean gradient measures 2.0 mmHg. Aortic valve peak gradient measures 3.5 mmHg. Pulmonic Valve: The pulmonic valve was grossly normal. Pulmonic valve regurgitation is trivial. Aorta: The aortic root is normal in size and structure. Venous: The inferior vena cava is normal in size with less than 50% respiratory variability, suggesting right atrial pressure of 8 mmHg. IAS/Shunts: No atrial level shunt detected by color flow Doppler.   LV Volumes (MOD) LV vol d, MOD A2C: 130.0 ml Diastology LV vol d, MOD A4C: 126.0 ml LV e' medial:    10.70 cm/s LV vol s, MOD A2C: 73.6 ml   LV E/e' medial:  6.8 LV vol s, MOD A4C: 72.7 ml  LV e' lateral:   10.00 cm/s LV SV MOD A2C:     56.4 ml  LV E/e' lateral: 7.2 LV SV MOD A4C:     126.0 ml LV SV MOD BP:      57.7 ml                              3D Volume EF:                             3D EF:        50 %                             LV EDV:       146 ml                             LV ESV:       73 ml                             LV SV:        73 ml RIGHT VENTRICLE  IVC RV Basal diam:  2.90 cm  IVC diam: 2.00 cm LEFT ATRIUM           Index       RIGHT ATRIUM           Index LA Vol (A2C): 52.4 ml 24.85 ml/m RA Area:     11.60 cm                                   RA Volume:   27.90 ml  13.23 ml/m  AORTIC VALVE AV Vmax:           93.90 cm/s AV Vmean:          67.300 cm/s AV VTI:            0.172 m AV Peak Grad:      3.5 mmHg AV Mean Grad:      2.0 mmHg LVOT Vmax:         78.40 cm/s LVOT Vmean:        55.300 cm/s LVOT VTI:          0.149 m LVOT/AV VTI ratio: 0.87 MITRAL VALVE MV Area (PHT): 5.42 cm    SHUNTS MV Decel Time: 140 msec    Systemic VTI: 0.15 m MV E velocity: 72.40 cm/s MV A velocity: 50.10 cm/s MV E/A ratio:  1.45 Nona Dell MD Electronically signed by Nona Dell MD Signature Date/Time: 12/13/2020/11:36:47 AM    Final         Scheduled Meds:  aspirin  650 mg Oral Q8H   colchicine  0.6 mg Oral BID   enoxaparin (LOVENOX) injection  40 mg Subcutaneous Q24H   pantoprazole  40 mg Oral Daily   potassium chloride  20 mEq Oral BID   spironolactone  12.5 mg Oral Daily   Continuous Infusions:     LOS: 1 day    Time spent: 35 minutes    Dorcas Carrow, MD [facility] Pager [phone]

## 2020-12-14 NOTE — Progress Notes (Addendum)
Patient ID: Dale Macias, male   DOB: 05-26-1990, 30 y.o.   MRN: [id number]     Advanced Heart Failure Rounding Note  PCP-Cardiologist: None   Subjective:    Chest pain is improving, now notices pain only with very deep breath or cough.   D dimer normal, ESR normal, CRP elevated 6.5, respiratory virus panel/flu/COVID-19 negative, HIV negative.  HS-TnI peaked at 9714.   Tm 103 last night, WBCS 10.6. now afebrile.   Cardiac MRI:  1.  Normal LV size with mild diffuse hypokinesis, EF 49%. 2.  Normal RV size and systolic function, EF 35%. 3. No myocardial LGE, but there does appear to be some enhancement of the pericardium adjacent to the anterior wall. I do not see a pericardial effusion and the pericardium does not appear thickened. 4. Mildly elevated T2 signal in the anterior wall, borderline elevated ECV measurement using T1 analysis.   This study could be consistent with myopericarditis.   Objective:   Weight Range: 90.7 kg Body mass index is 27.89 kg/m.   Vital Signs:   Temp:  [98.2 F (36.8 C)-103 F (39.4 C)] 98.2 F (36.8 C) (09/18 0700) Pulse Rate:  [83-118] 83 (09/18 0800) Resp:  [16-26] 16 (09/18 0800) BP: (92-110)/(53-71) 94/67 (09/18 0800) SpO2:  [92 %-97 %] 94 % (09/18 0800) Last BM Date: 12/13/20  Weight change: Filed Weights   12/12/20 2345  Weight: 90.7 kg    Intake/Output:   Intake/Output Summary (Last 24 hours) at 12/14/2020 1106 Last data filed at 12/14/2020 0800 Gross per 24 hour  Intake 741.52 ml  Output 2750 ml  Net -2008.48 ml      Physical Exam    General:  Well appearing. No resp difficulty HEENT: Normal Neck: Supple. JVP 7. Carotids 2+ bilat; no bruits. No lymphadenopathy or thyromegaly appreciated. Cor: PMI nondisplaced. Regular rate & rhythm. No rub, gallops or murmurs. Lungs: Clear Abdomen: Soft, nontender, nondistended. No hepatosplenomegaly. No bruits or masses. Good bowel sounds. Extremities: No cyanosis, clubbing,  rash, edema Neuro: Alert & orientedx3, cranial nerves grossly intact. moves all 4 extremities w/o difficulty. Affect pleasant   Telemetry   NSR 80s  EKG    NSR, diffuse slight ST elevation  Labs    CBC Recent Labs    12/13/20 0001 12/14/20 0013  WBC 10.2 10.6*  NEUTROABS  --  8.3*  HGB 14.4 12.2*  HCT 42.6 35.3*  MCV 86.9 85.7  PLT 178 465   Basic Metabolic Panel Recent Labs    12/13/20 0001 12/14/20 0013  NA 135 136  K 4.1 3.4*  CL 101 105  CO2 24 23  GLUCOSE 124* 123*  BUN 13 9  CREATININE 1.18 1.23  CALCIUM 8.7* 7.9*  MG 1.9  --    Liver Function Tests Recent Labs    12/13/20 0001 12/14/20 0013  AST 38 57*  ALT 30 26  ALKPHOS 43 30*  BILITOT 0.8 0.8  PROT 7.1 5.2*  ALBUMIN 4.2 2.8*   Recent Labs    12/13/20 0001  LIPASE 27   Cardiac Enzymes Recent Labs    12/13/20 0807 12/14/20 0013  CKTOTAL 147 410*    BNP: BNP (last 3 results) Recent Labs    12/13/20 0610  BNP 34.5    ProBNP (last 3 results) No results for input(s): PROBNP in the last 8760 hours.   D-Dimer Recent Labs    12/13/20 0807  DDIMER 0.36   Hemoglobin A1C No results for input(s): HGBA1C in the last  72 hours. Fasting Lipid Panel No results for input(s): CHOL, HDL, LDLCALC, TRIG, CHOLHDL, LDLDIRECT in the last 72 hours. Thyroid Function Tests No results for input(s): TSH, T4TOTAL, T3FREE, THYROIDAB in the last 72 hours.  Invalid input(s): FREET3  Other results:   Imaging    MR CARDIAC MORPHOLOGY W WO CONTRAST  Result Date: 12/14/2020 CLINICAL DATA:  Acute myopericarditis EXAM: CARDIAC MRI TECHNIQUE: The patient was scanned on a 1.5 Tesla GE magnet. A dedicated cardiac coil was used. Functional imaging was done using Fiesta sequences. 2,3, and 4 chamber views were done to assess for RWMA's. Modified Simpson's rule using a short axis stack was used to calculate an ejection fraction on a dedicated work Conservation officer, nature. The patient received 8 cc  of Gadavist. After 10 minutes inversion recovery sequences were used to assess for infiltration and scar tissue. FINDINGS: Small bilateral pleural effusions.  Bibasilar airspace disease R>L. The pericardium does not appear thickened, no pericardial effusion noted. Normal left ventricular size and wall thickness, mild diffuse hypokinesis with EF 49%. T2 signal reaches 55 (elevated) in the anterior wall (normal/<54 other walls). Extracellular volume by T1 analysis 30% (mildly elevated). Normal right ventricular size and systolic function, EF 27%. Normal left and right atrial sizes. First pass perfusion appear normal. On delayed enhancement imaging, there was no myocardial late gadolinium enhancement (LGE) noted. However, the pericardium adjacent to the anterior wall of the LV appeared enhanced. MEASUREMENTS: MEASUREMENTS LVEDV 191 mL LVSV 94 mL LVEF 49% RVEDV 215 mL RVSV 97 mL RVEF 45% T2 anterior wall 55 ECV by T1 analysis 30% IMPRESSION: 1.  Normal LV size with mild diffuse hypokinesis, EF 49%. 2.  Normal RV size and systolic function, EF 03%. 3. No myocardial LGE, but there does appear to be some enhancement of the pericardium adjacent to the anterior wall. I do not see a pericardial effusion and the pericardium does not appear thickened. 4. Mildly elevated T2 signal in the anterior wall, borderline elevated ECV measurement using T1 analysis. This study could be consistent with myopericarditis. Thoren Hosang Electronically Signed   By: Loralie Champagne M.D.   On: 12/14/2020 10:49     Medications:     Scheduled Medications:  aspirin  650 mg Oral Q8H   colchicine  0.6 mg Oral BID   enoxaparin (LOVENOX) injection  40 mg Subcutaneous Q24H   pantoprazole  40 mg Oral Daily   potassium chloride  20 mEq Oral BID   spironolactone  12.5 mg Oral Daily    Infusions:   PRN Medications: acetaminophen **OR** acetaminophen, ondansetron **OR** ondansetron (ZOFRAN) IV, oxyCODONE    Assessment/Plan   1. Suspect  acute myopericarditis: Cardiac MRI today with LV EF 49%, normal RV, no myocardial LGE but mildly elevated T2 signal anterior wall and appears to be LGE in the pericardium adjacent to the anterior wall.  ECG with mild diffuse ST elevation.  HS-TnI up to 9716.  Most likely acute myopericarditis.  Question if this is related to COVID-19 infection that he had about a month ago, has had on and off symptoms for a number of weeks, more severe pleuritic chest pain recently.  Pleuritic chest pain still present but improved.  - Continue colchicine 0.6 bid.  - Continue ASA 650 q8 hrs.  - Add PPI  - Activity limitation x 6 months, discussed.  - Some family history of CAD but generally in 51s though cousin had SCD at 51.  Family concerned about CAD.  HR ok, suspect he  could do a coronary CT angiogram.  Will order for tomorrow.  - On low dose spironolactone, will add bisoprolol 2.5 daily with mildly decreased EF and risk for arrhythmias with myocarditis component. 2. ID: Febrile to 103, viral panels all negative.  MRI showed bibasilar airspace disease on R>L.  ?Secondary bacterial infection on lungs previously damaged by recent COVID-19.  - Agree with abx for now, on vancomycin/cefepime.   Length of Stay: 1  Loralie Champagne, MD  12/14/2020, 11:06 AM  Advanced Heart Failure Team Pager [phone] (M-F; 7a - 5p)  Please contact [facility] for night-coverage after hours (5p -7a ) and weekends on [email or web address]

## 2020-12-14 NOTE — Progress Notes (Signed)
   12/13/20 2300  Assess: MEWS Score  Temp (!) 103 F (39.4 C)  BP 97/64  Pulse Rate (!) 116  ECG Heart Rate (!) 109  Resp 20  Level of Consciousness Alert  SpO2 93 %  O2 Device Nasal Cannula  O2 Flow Rate (L/min) 2 L/min  Assess: MEWS Score  MEWS Temp 2  MEWS Systolic 1  MEWS Pulse 1  MEWS RR 0  MEWS LOC 0  MEWS Score 4  MEWS Score Color Red  Assess: if the MEWS score is Yellow or Red  Were vital signs taken at a resting state? Yes  Focused Assessment No change from prior assessment  Early Detection of Sepsis Score *See Row Information* Low  MEWS guidelines implemented *See Row Information* No, previously red, continue vital signs every 4 hours  Treat  MEWS Interventions Administered scheduled meds/treatments  Pain Scale 0-10  Pain Score 0  Take Vital Signs  Increase Vital Sign Frequency  Red: Q 1hr X 4 then Q 4hr X 4, if remains red, continue Q 4hrs  Notify: Provider  Provider Name/Title Dr. Jones Broom  Date Provider Notified 12/13/20  Time Provider Notified 2310  Notification Type Call  Notification Reason Critical result  Provider response See new orders  Date of Provider Response 12/13/20  Time of Provider Response 2310  Document  Patient Outcome Other (Comment) (temp remains high, treating with abx and tylenol/motrin)

## 2020-12-15 ENCOUNTER — Inpatient Hospital Stay (HOSPITAL_COMMUNITY): Payer: BC Managed Care – PPO

## 2020-12-15 DIAGNOSIS — I400 Infective myocarditis: Secondary | ICD-10-CM | POA: Diagnosis not present

## 2020-12-15 DIAGNOSIS — R9431 Abnormal electrocardiogram [ECG] [EKG]: Secondary | ICD-10-CM

## 2020-12-15 LAB — BASIC METABOLIC PANEL
Anion gap: 8 (ref 5–15)
BUN: 5 mg/dL — ABNORMAL LOW (ref 6–20)
CO2: 24 mmol/L (ref 22–32)
Calcium: 8.2 mg/dL — ABNORMAL LOW (ref 8.9–10.3)
Chloride: 108 mmol/L (ref 98–111)
Creatinine, Ser: 1.03 mg/dL (ref 0.61–1.24)
GFR, Estimated: >60 mL/min (ref >60)
Glucose, Bld: 100 mg/dL — ABNORMAL HIGH (ref 70–99)
Potassium: 3.7 mmol/L (ref 3.5–5.1)
Sodium: 140 mmol/L (ref 135–145)

## 2020-12-15 IMAGING — CT CT HEART MORP W/ CTA COR W/ SCORE W/ CA W/CM &/OR W/O CM
4 of 7 series · 8 of 20 positions shown, 9 images · IV contrast (APPLIED)
Comparison: None.
COMPARISON: None.

Addendum:
EXAM:
OVER-READ INTERPRETATION  CT CHEST

The following report is an over-read performed by radiologist Dr.
CHIC [REDACTED] on [DATE]. This
over-read does not include interpretation of cardiac or coronary
anatomy or pathology. The coronary calcium score/coronary CTA
interpretation by the cardiologist is attached.
CLINICAL DATA: Elevated Troponin Abnormal ECG?  Myocarditis vs CAD
Cardiac CTA
MEDICATIONS:
Sub lingual nitro. 4 mg and Ivabradine 5 mg
TECHNIQUE: The patient was scanned on a Siemens Force 192 scanner. Gantry
rotation speed was 250 msecs. Collimation was. 6 mm . A 120 kV
prospective scan was triggered in the ascending thoracic aorta at
140 HU's with full mA between 30-70% of the R-R interval . Average
HR during the scan was 68 bpm. The 3D data set was interpreted on a
dedicated work station using MPR, MIP and VRT modes. A total of 80
cc of contrast was used.

[Series 6: ts diast sharp · axial · 0.39mm/px · z∈[+1245,+1288]mm · 2 of 328 slices shown]
[im 110/328  lung]
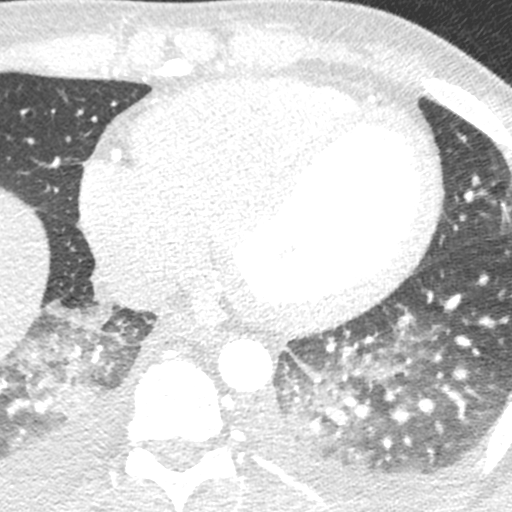
[im 219/328  lung]
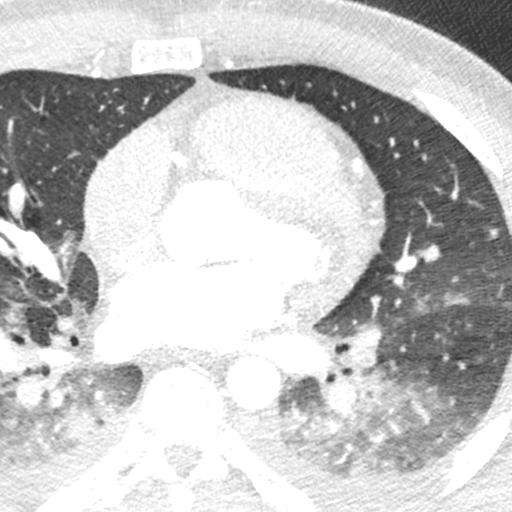

[Series 7: best diast · axial · 0.39mm/px · z∈[+1245,+1288]mm · 2 of 328 slices shown, 3 images]
[im 110/328  vessel]
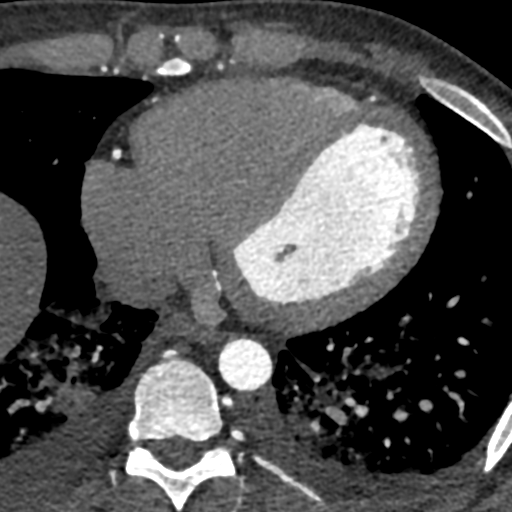
[im 110/328  lung]
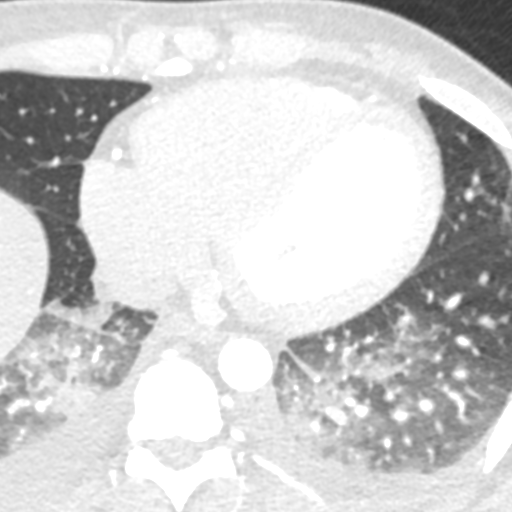
[im 219/328  vessel]
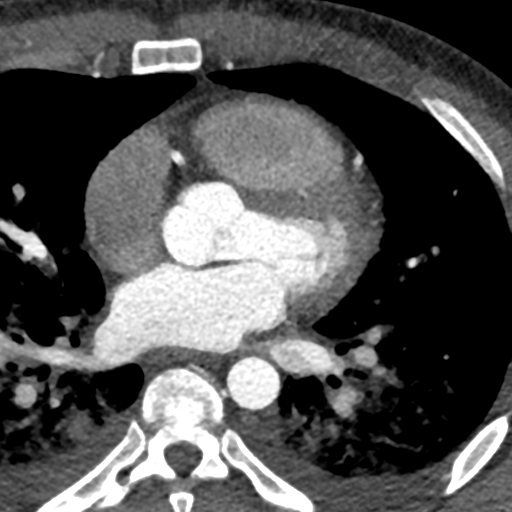

[Series 8: best syst · axial · 0.39mm/px · z∈[+1245,+1288]mm · 2 of 328 slices shown]
[im 110/328  vessel]
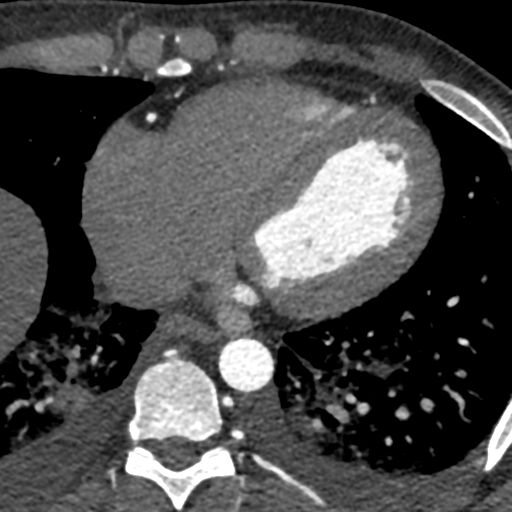
[im 219/328  vessel]
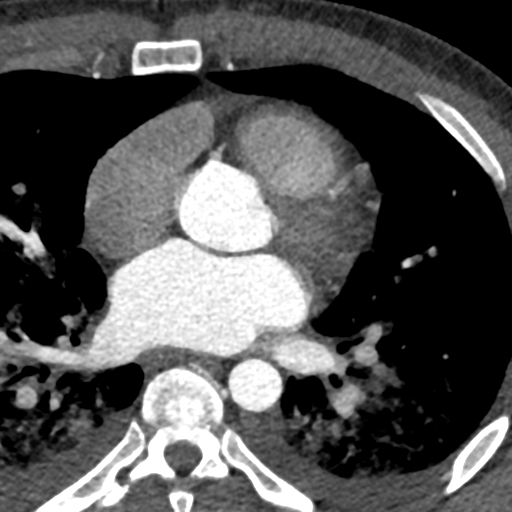

[Series 9: ts syst sharp · axial · 0.39mm/px · z∈[+1245,+1288]mm · 2 of 328 slices shown]
[im 110/328  lung]
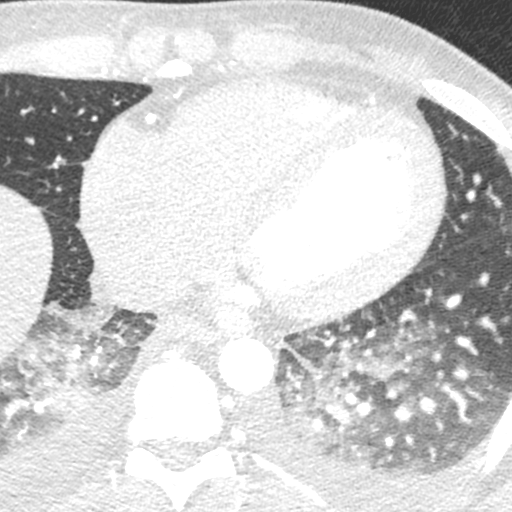
[im 219/328  lung]
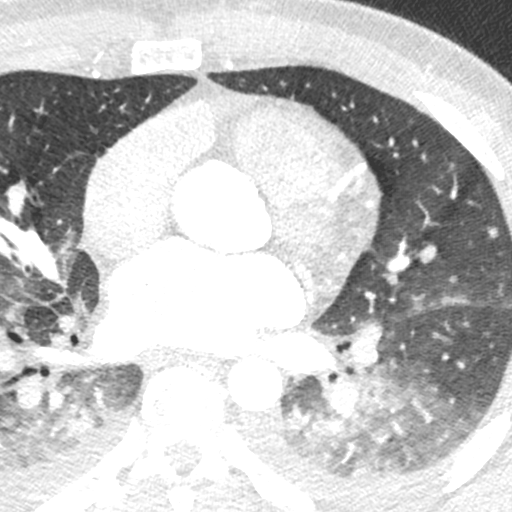

[8 of 20 positions shown; findings below may reference images not displayed]

FINDINGS: Patchy areas of ground-glass attenuation and peribronchovascular
airspace consolidation are noted in the mid to lower lungs, most
evident in the lower lobes. Small bilateral pleural effusions lying
dependently. Within the visualized portions of the thorax there are
no suspicious appearing pulmonary nodules or masses, no pneumothorax
and no lymphadenopathy. Visualized portions of the upper abdomen are
unremarkable. There are no aggressive appearing lytic or blastic
lesions noted in the visualized portions of the skeleton.
IMPRESSION: 1. The appearance of the chest suggests multilobar bilateral
bronchopneumonia, or potentially sequela of recent aspiration.
Associated with this there are small bilateral parapneumonic pleural
effusions lying dependently.
FINDINGS: Non-cardiac: See separate report from [REDACTED]. No
significant findings on limited lung and soft tissue windows.

Calcium score: No calcium noted

Coronary Arteries: Right dominant with no anomalies

LM: Normal

LAD: Normal

IM: Normal

D1: Normal

D2: Normal

Circumflex: Normal

OM1: Normal

OM2: Normal

RCA: Normal

PDA: Normal

PLA: Normal

No pericardial effusion and thickness appears normal
IMPRESSION: 1. Normal right dominant coronary arteries CAD RADS 0

2.  Calcium score 0

3.  Normal appearing pericardium with no effusion

4.  Normal aortic root 3.2 cm

5.  See radiology report regarding bilateral bronchopneumonia

CHIC

*** End of Addendum ***
EXAM:
OVER-READ INTERPRETATION  CT CHEST

The following report is an over-read performed by radiologist Dr.
CHIC [REDACTED] on [DATE]. This
over-read does not include interpretation of cardiac or coronary
anatomy or pathology. The coronary calcium score/coronary CTA
interpretation by the cardiologist is attached.
FINDINGS: Patchy areas of ground-glass attenuation and peribronchovascular
airspace consolidation are noted in the mid to lower lungs, most
evident in the lower lobes. Small bilateral pleural effusions lying
dependently. Within the visualized portions of the thorax there are
no suspicious appearing pulmonary nodules or masses, no pneumothorax
and no lymphadenopathy. Visualized portions of the upper abdomen are
unremarkable. There are no aggressive appearing lytic or blastic
lesions noted in the visualized portions of the skeleton.
IMPRESSION: 1. The appearance of the chest suggests multilobar bilateral
bronchopneumonia, or potentially sequela of recent aspiration.
Associated with this there are small bilateral parapneumonic pleural
effusions lying dependently.

## 2020-12-15 MED ORDER — NITROGLYCERIN 0.4 MG SL SUBL
SUBLINGUAL_TABLET | SUBLINGUAL | Status: AC
  Administered 2020-12-15: 0.8 mg via SUBLINGUAL
  Filled 2020-12-15: qty 2

## 2020-12-15 MED ORDER — NITROGLYCERIN 0.4 MG SL SUBL: 0.8000 mg | SUBLINGUAL_TABLET | Freq: Once | SUBLINGUAL | Status: AC

## 2020-12-15 MED ORDER — IOHEXOL 350 MG/ML SOLN
95.0000 mL | Freq: Once | INTRAVENOUS | Status: AC | PRN
  Administered 2020-12-15: 95 mL via INTRAVENOUS

## 2020-12-15 MED ORDER — METOPROLOL TARTRATE 5 MG/5ML IV SOLN: 5.0000 mg | Freq: Once | INTRAVENOUS | Status: AC

## 2020-12-15 MED ORDER — SPIRONOLACTONE 25 MG PO TABS
25.0000 mg | ORAL_TABLET | Freq: Every day | ORAL | Status: DC
  Administered 2020-12-15: 25 mg via ORAL
  Filled 2020-12-15: qty 1

## 2020-12-15 MED ORDER — IVABRADINE HCL 5 MG PO TABS
5.0000 mg | ORAL_TABLET | Freq: Once | ORAL | Status: AC
  Administered 2020-12-15: 5 mg via ORAL
  Filled 2020-12-15: qty 1

## 2020-12-15 MED ORDER — SODIUM CHLORIDE 0.9 % IV SOLN
100.0000 mg | Freq: Two times a day (BID) | INTRAVENOUS | Status: DC
  Administered 2020-12-15 – 2020-12-16 (×2): 100 mg via INTRAVENOUS
  Filled 2020-12-15 (×2): qty 100

## 2020-12-15 MED ORDER — PANTOPRAZOLE SODIUM 40 MG PO TBEC
40.0000 mg | DELAYED_RELEASE_TABLET | Freq: Two times a day (BID) | ORAL | Status: DC
  Administered 2020-12-15 – 2020-12-16 (×2): 40 mg via ORAL
  Filled 2020-12-15 (×2): qty 1

## 2020-12-15 MED ORDER — METOPROLOL TARTRATE 5 MG/5ML IV SOLN
INTRAVENOUS | Status: AC
  Administered 2020-12-15: 5 mg via INTRAVENOUS
  Filled 2020-12-15: qty 5

## 2020-12-15 NOTE — Plan of Care (Signed)
  Problem: Education: Goal: Knowledge of General Education information will improve Description: Including pain rating scale, medication(s)/side effects and non-pharmacologic comfort measures Outcome: Progressing   Problem: Clinical Measurements: Goal: Will remain free from infection Outcome: Progressing Goal: Diagnostic test results will improve Outcome: Progressing   Problem: Activity: Goal: Risk for activity intolerance will decrease Outcome: Progressing   Problem: Nutrition: Goal: Adequate nutrition will be maintained Outcome: Progressing   Problem: Coping: Goal: Level of anxiety will decrease Outcome: Progressing   Problem: Elimination: Goal: Will not experience complications related to bowel motility Outcome: Progressing Goal: Will not experience complications related to urinary retention Outcome: Progressing   Problem: Pain Managment: Goal: General experience of comfort will improve Outcome: Progressing   Problem: Safety: Goal: Ability to remain free from injury will improve Outcome: Progressing

## 2020-12-15 NOTE — Progress Notes (Signed)
1330 Came to see pt but at testing. Will continue to follow. Luetta Nutting RN BSN 12/15/2020 2:26 PM

## 2020-12-15 NOTE — Progress Notes (Addendum)
Patient ID: Dale Macias, male   DOB: 1991-02-17, 30 y.o.   MRN: [id number]     Advanced Heart Failure Rounding Note  PCP-Cardiologist: None   Subjective:   Yesterday 2.5 mg bisoprolol 2.5 mg daily. No BM.   Feels ok today. Walked in the hall.. Fatigued after walk.  Oxygen saturations dropped with ambulation.   T Max 100.   D dimer normal, ESR normal, CRP elevated 6.5, respiratory virus panel/flu/COVID-19 negative, HIV negative.  HS-TnI peaked at 9714.    Cardiac MRI:  1.  Normal LV size with mild diffuse hypokinesis, EF 49%. 2.  Normal RV size and systolic function, EF 76%. 3. No myocardial LGE, but there does appear to be some enhancement of the pericardium adjacent to the anterior wall. I do not see a pericardial effusion and the pericardium does not appear thickened. 4. Mildly elevated T2 signal in the anterior wall, borderline elevated ECV measurement using T1 analysis.  This study could be consistent with myopericarditis.   Objective:   Weight Range: 90.7 kg Body mass index is 27.89 kg/m.   Vital Signs:   Temp:  [98.4 F (36.9 C)-100.1 F (37.8 C)] 99 F (37.2 C) (09/19 0411) Pulse Rate:  [83-108] 92 (09/19 0411) Resp:  [16-22] 20 (09/19 0411) BP: (94-122)/(62-81) 116/63 (09/19 0411) SpO2:  [90 %-94 %] 92 % (09/19 0411) Last BM Date: 12/13/20  Weight change: Filed Weights   12/12/20 2345  Weight: 90.7 kg    Intake/Output:   Intake/Output Summary (Last 24 hours) at 12/15/2020 0716 Last data filed at 12/14/2020 1300 Gross per 24 hour  Intake 480 ml  Output --  Net 480 ml      Physical Exam   General:  Well appearing. No resp difficulty HEENT: normal Neck: supple. no JVD. Carotids 2+ bilat; no bruits. No lymphadenopathy or thryomegaly appreciated. Cor: PMI nondisplaced. Regular rate & rhythm. No rubs, gallops or murmurs. Lungs: clear on 2 liters [place]  Abdomen: soft, nontender, nondistended. No hepatosplenomegaly. No bruits or masses. Good bowel  sounds. Extremities: no cyanosis, clubbing, rash, edema Neuro: alert & orientedx3, cranial nerves grossly intact. moves all 4 extremities w/o difficulty. Affect pleasant  Telemetry   SR 80s personally reviewed.   EKG    NSR, diffuse slight ST elevation  Labs    CBC Recent Labs    12/13/20 0001 12/14/20 0013  WBC 10.2 10.6*  NEUTROABS  --  8.3*  HGB 14.4 12.2*  HCT 42.6 35.3*  MCV 86.9 85.7  PLT 178 808   Basic Metabolic Panel Recent Labs    12/13/20 0001 12/14/20 0013 12/15/20 0036  NA 135 136 140  K 4.1 3.4* 3.7  CL 101 105 108  CO2 $Re'24 23 24  'wyL$ GLUCOSE 124* 123* 100*  BUN 13 9 5*  CREATININE 1.18 1.23 1.03  CALCIUM 8.7* 7.9* 8.2*  MG 1.9  --   --    Liver Function Tests Recent Labs    12/13/20 0001 12/14/20 0013  AST 38 57*  ALT 30 26  ALKPHOS 43 30*  BILITOT 0.8 0.8  PROT 7.1 5.2*  ALBUMIN 4.2 2.8*   Recent Labs    12/13/20 0001  LIPASE 27   Cardiac Enzymes Recent Labs    12/13/20 0807 12/14/20 0013  CKTOTAL 147 410*    BNP: BNP (last 3 results) Recent Labs    12/13/20 0610  BNP 34.5    ProBNP (last 3 results) No results for input(s): PROBNP in the last 8760 hours.  D-Dimer Recent Labs    12/13/20 0807  DDIMER 0.36   Hemoglobin A1C No results for input(s): HGBA1C in the last 72 hours. Fasting Lipid Panel No results for input(s): CHOL, HDL, LDLCALC, TRIG, CHOLHDL, LDLDIRECT in the last 72 hours. Thyroid Function Tests No results for input(s): TSH, T4TOTAL, T3FREE, THYROIDAB in the last 72 hours.  Invalid input(s): FREET3  Other results:   Imaging    MR CARDIAC MORPHOLOGY W WO CONTRAST  Result Date: 12/14/2020 CLINICAL DATA:  Acute myopericarditis EXAM: CARDIAC MRI TECHNIQUE: The patient was scanned on a 1.5 Tesla GE magnet. A dedicated cardiac coil was used. Functional imaging was done using Fiesta sequences. 2,3, and 4 chamber views were done to assess for RWMA's. Modified Simpson's rule using a short axis stack  was used to calculate an ejection fraction on a dedicated work Conservation officer, nature. The patient received 8 cc of Gadavist. After 10 minutes inversion recovery sequences were used to assess for infiltration and scar tissue. FINDINGS: Small bilateral pleural effusions.  Bibasilar airspace disease R>L. The pericardium does not appear thickened, no pericardial effusion noted. Normal left ventricular size and wall thickness, mild diffuse hypokinesis with EF 49%. T2 signal reaches 55 (elevated) in the anterior wall (normal/<54 other walls). Extracellular volume by T1 analysis 30% (mildly elevated). Normal right ventricular size and systolic function, EF 35%. Normal left and right atrial sizes. First pass perfusion appear normal. On delayed enhancement imaging, there was no myocardial late gadolinium enhancement (LGE) noted. However, the pericardium adjacent to the anterior wall of the LV appeared enhanced. MEASUREMENTS: MEASUREMENTS LVEDV 191 mL LVSV 94 mL LVEF 49% RVEDV 215 mL RVSV 97 mL RVEF 45% T2 anterior wall 55 ECV by T1 analysis 30% IMPRESSION: 1.  Normal LV size with mild diffuse hypokinesis, EF 49%. 2.  Normal RV size and systolic function, EF 00%. 3. No myocardial LGE, but there does appear to be some enhancement of the pericardium adjacent to the anterior wall. I do not see a pericardial effusion and the pericardium does not appear thickened. 4. Mildly elevated T2 signal in the anterior wall, borderline elevated ECV measurement using T1 analysis. This study could be consistent with myopericarditis. Dalton Mclean Electronically Signed   By: Loralie Champagne M.D.   On: 12/14/2020 10:49     Medications:     Scheduled Medications:  aspirin  650 mg Oral Q8H   bisoprolol  2.5 mg Oral Daily   colchicine  0.6 mg Oral BID   enoxaparin (LOVENOX) injection  40 mg Subcutaneous Q24H   pantoprazole  40 mg Oral Daily   potassium chloride  20 mEq Oral BID   spironolactone  12.5 mg Oral Daily     Infusions:   PRN Medications: acetaminophen **OR** acetaminophen, ondansetron **OR** ondansetron (ZOFRAN) IV, oxyCODONE    Assessment/Plan   1. Suspect acute myopericarditis: Cardiac MRI today with LV EF 49%, normal RV, no myocardial LGE but mildly elevated T2 signal anterior wall and appears to be LGE in the pericardium adjacent to the anterior wall.  ECG with mild diffuse ST elevation.  HS-TnI up to 9716.  Most likely acute myopericarditis.  Question if this is related to COVID-19 infection that he had about a month ago, has had on and off symptoms for a number of weeks, more severe pleuritic chest pain recently.  Pleuritic chest pain resolving.   - Continue colchicine 0.6 bid.  - Continue ASA 650 q8 hrs.  - Continue PPI  - Activity limitation x 6  months, discussed.  - Some family history of CAD but generally in 77s though cousin had SCD at 58.  Family concerned about CAD.  H - Plan coronary CT angiogram today.   - Increase spiro 25 mg daily.  - Continue bisoprolol 2.5 daily with mildly decreased EF and risk for arrhythmias with myocarditis component. - Renal function stable.  2. ID: T Max 100, viral panels all negative.  MRI showed bibasilar airspace disease on R>L.  ?Secondary bacterial infection on lungs previously damaged by recent COVID-19. Has had COVID x3 - Continue vancomycin/cefepime.    Of note- his wife said when he was recently hunting he was drinking water from a creek. No longer having diarrhea. No BMs. Stool sample has been ordered.   Consult cardiac rehab.  Length of Stay: 2  Darrick Grinder, NP  12/15/2020, 7:16 AM  Advanced Heart Failure Team Pager [phone] (M-F; 7a - 5p)  Please contact [facility] for night-coverage after hours (5p -7a ) and weekends on [email or web address]   Patient seen and examined with the above-signed Advanced Practice Provider and/or Housestaff. I personally reviewed laboratory data, imaging studies and relevant notes. I independently examined  the patient and formulated the important aspects of the plan. I have edited the note to reflect any of my changes or salient points. I have personally discussed the plan with the patient and/or family.  Chest pain improved. Breathing better but still with occasional hypoxia. cMRI reviewed with him. Trop down today. For cardiac CTA.  General:  Well appearing. No resp difficulty HEENT: normal Neck: supple. no JVD. Carotids 2+ bilat; no bruits. No lymphadenopathy or thryomegaly appreciated. Cor: PMI nondisplaced. Regular rate & rhythm. No rubs, gallops or murmurs. Lungs: clear Abdomen: soft, nontender, nondistended. No hepatosplenomegaly. No bruits or masses. Good bowel sounds. Extremities: no cyanosis, clubbing, rash, edema Neuro: alert & orientedx3, cranial nerves grossly intact. moves all 4 extremities w/o difficulty. Affect pleasant  Recovering slowly. Suspect significant myopericarditis. For cardiac CTA. Adjust meds as above. Suspect hypoxia may be related to splinting from CP. Mobilize.   Glori Bickers, MD  3:28 PM

## 2020-12-15 NOTE — Progress Notes (Signed)
PROGRESS NOTE    CORDALE MANERA  DZH:[id number] DOB: 1990-11-08 DOA: 12/12/2020 PCP: Pcp, No    Brief Narrative:  30 year old gentleman with no medical history presented with diarrhea, vomiting whole body ache and pleuritic chest pain for 3 days.  In the emergency room he was hypotensive and febrile with temperature 103.  COVID-19 and influenza negative.  Initial troponin 1600 and elevated to 9000 with negative EKG.  Sepsis protocol was initiated, stabilized and admitted to the hospital. MRI consistent with myopericarditis.  Patient had COVID-19 infection 3 times in the past, last one a month ago.  Retrospectively complains of episodic mild symptoms for more than a month.   Assessment & Plan:   Active Problems:   SIRS (systemic inflammatory response syndrome) (HCC)   Febrile illness   Myocarditis (HCC)  SIRS with febrile illness: Due to viral myopericarditis. No evidence of bacterial infection.  Adequately resuscitated and clinically stabilizing.  Antibiotic discontinued 9/18.  Cultures negative so far.  Viral panel negative.   No bowel movement for stool testing, no significance now.   This is probably due to COVID-19 infection.   Bibasilar airspace disease seen in MRI of the heart, chest x-ray normal.  This is not likely bacterial infection. It is probably the same inflammatory response to pericardium, myocardium and pleura.  Elevated troponins due to myopericarditis. COVID-19 and influenza negative.  Probably due to earlier COVID-19 infection. Viral panel negative. Clinically stabilizing and symptoms are improving. On high-dose aspirin and colchicine.  Protonix added. Followed by cardiology. Cardiac MRI with myopericarditis.  Uncomplicated. Coronary CT today to rule out coronary obstruction. Clinically stabilizing. Activity restrictions, further cardiac follow-up to be arranged by cardiology.  Acute systolic congestive heart failure: Likely due to myopericarditis.   Ejection fraction 45%.  Euvolemic on exam.  Troponin 9000 and downtrending. On high-dose aspirin.  Started on Aldactone and bisoprolol.  Followed by cardiology. Potassium adequate.  Continue to mobilize.  Coronary CT scan today.    DVT prophylaxis: enoxaparin (LOVENOX) injection 40 mg Start: 12/13/20 1000   Code Status: Full code Family Communication: None Disposition Plan: Status is: Inpatient  Remains inpatient appropriate because:Inpatient level of care appropriate due to severity of illness  Dispo: The patient is from: Home              Anticipated d/c is to: Home              Patient currently is not medically stable to d/c.   Difficult to place patient No         Consultants:  Cardiology  Procedures:  Cardiology  Antimicrobials:  Vancomycin and cefepime 9/17-9/18   Subjective: Seen and examined.  No more chest pain.  Some fatigue on walking, noted drop in oxygen, however not recorded.  He feels mostly improved.  Feels slightly hard. Temperature maximum 100 over last 24 hours. Wife at the bedside as multiple questions.  Previously had abnormal bowel movement but no BM since last 48 hours.  No abdominal pain or distention.  Questions about 6 months light duty and other activities.  Will need return instructions.   Objective: Vitals:   12/14/20 2130 12/14/20 2357 12/15/20 0411 12/15/20 0736  BP:  107/62 116/63 108/72  Pulse:  90 92 90  Resp:  (!) 22 20 20   Temp: 99.5 F (37.5 C) 99 F (37.2 C) 99 F (37.2 C) 98.3 F (36.8 C)  TempSrc: Oral Oral Oral Oral  SpO2:  90% 92% 93%  Weight:  Height:        Intake/Output Summary (Last 24 hours) at 12/15/2020 1010 Last data filed at 12/14/2020 1300 Gross per 24 hour  Intake 240 ml  Output --  Net 240 ml   Filed Weights   12/12/20 2345  Weight: 90.7 kg    Examination:  General exam: Appears calm and comfortable  Respiratory system: Clear to auscultation. Respiratory effort  normal. Cardiovascular system: S1 & S2 heard, RRR. No JVD, murmurs, rubs, gallops or clicks. No pedal edema. Gastrointestinal system: Abdomen is nondistended, soft and nontender. No organomegaly or masses felt. Normal bowel sounds heard. Central nervous system: Alert and oriented. No focal neurological deficits. Extremities: Symmetric 5 x 5 power. Skin: No rashes, lesions or ulcers.  No joint effusions or swelling. Psychiatry: Judgement and insight appear normal. Mood & affect appropriate.     Data Reviewed: I have personally reviewed following labs and imaging studies  CBC: Recent Labs  Lab 12/13/20 0001 12/14/20 0013  WBC 10.2 10.6*  NEUTROABS  --  8.3*  HGB 14.4 12.2*  HCT 42.6 35.3*  MCV 86.9 85.7  PLT 178 161   Basic Metabolic Panel: Recent Labs  Lab 12/13/20 0001 12/14/20 0013 12/15/20 0036  NA 135 136 140  K 4.1 3.4* 3.7  CL 101 105 108  CO2 GLUCOSE 124* 123* 100*  BUN 13 9 5*  CREATININE 1.18 1.23 1.03  CALCIUM 8.7* 7.9* 8.2*  MG 1.9  --   --    GFR: Estimated Creatinine Clearance: 122 mL/min (by C-G formula based on SCr of 1.03 mg/dL). Liver Function Tests: Recent Labs  Lab 12/13/20 0001 12/14/20 0013  AST 38 57*  ALT 30 26  ALKPHOS 43 30*  BILITOT 0.8 0.8  PROT 7.1 5.2*  ALBUMIN 4.2 2.8*   Recent Labs  Lab 12/13/20 0001  LIPASE 27   No results for input(s): AMMONIA in the last 168 hours. Coagulation Profile: No results for input(s): INR, PROTIME in the last 168 hours. Cardiac Enzymes: Recent Labs  Lab 12/13/20 0807 12/14/20 0013  CKTOTAL 147 410*   BNP (last 3 results) No results for input(s): PROBNP in the last 8760 hours. HbA1C: No results for input(s): HGBA1C in the last 72 hours. CBG: No results for input(s): GLUCAP in the last 168 hours. Lipid Profile: No results for input(s): CHOL, HDL, LDLCALC, TRIG, CHOLHDL, LDLDIRECT in the last 72 hours. Thyroid Function Tests: No results for input(s): TSH, T4TOTAL, FREET4,  T3FREE, THYROIDAB in the last 72 hours. Anemia Panel: No results for input(s): VITAMINB12, FOLATE, FERRITIN, TIBC, IRON, RETICCTPCT in the last 72 hours. Sepsis Labs: Recent Labs  Lab 12/13/20 0610 12/13/20 0655  PROCALCITON <0.10  --   LATICACIDVEN  --  0.8    Recent Results (from the past 240 hour(s))  Resp Panel by RT-PCR (Flu A&B, Covid) Nasopharyngeal Swab     Status: None   Collection Time: 12/13/20  1:40 AM   Specimen: Nasopharyngeal Swab; Nasopharyngeal(NP) swabs in vial transport medium  Result Value Ref Range Status   SARS Coronavirus 2 by RT PCR NEGATIVE NEGATIVE Final    Comment: (NOTE) SARS-CoV-2 target nucleic acids are NOT DETECTED.  The SARS-CoV-2 RNA is generally detectable in upper respiratory specimens during the acute phase of infection. The lowest concentration of SARS-CoV-2 viral copies this assay can detect is 138 copies/mL. A negative result does not preclude SARS-Cov-2 infection and should not be used as the sole basis for treatment or other patient management decisions.  A negative result may occur with  improper specimen collection/handling, submission of specimen other than nasopharyngeal swab, presence of viral mutation(s) within the areas targeted by this assay, and inadequate number of viral copies(<138 copies/mL). A negative result must be combined with clinical observations, patient history, and epidemiological information. The expected result is Negative.  Fact Sheet for Patients:  BloggerCourse.com  Fact Sheet for Healthcare Providers:  SeriousBroker.it  This test is no t yet approved or cleared by the [place] FDA and  has been authorized for detection and/or diagnosis of SARS-CoV-2 by FDA under an Emergency Use Authorization (EUA). This EUA will remain  in effect (meaning this test can be used) for the duration of the COVID-19 declaration under Section 564(b)(1) of the Act,  21 U.S.C.section 360bbb-3(b)(1), unless the authorization is terminated  or revoked sooner.       Influenza A by PCR NEGATIVE NEGATIVE Final   Influenza B by PCR NEGATIVE NEGATIVE Final    Comment: (NOTE) The Xpert Xpress SARS-CoV-2/FLU/RSV plus assay is intended as an aid in the diagnosis of influenza from Nasopharyngeal swab specimens and should not be used as a sole basis for treatment. Nasal washings and aspirates are unacceptable for Xpert Xpress SARS-CoV-2/FLU/RSV testing.  Fact Sheet for Patients: BloggerCourse.com  Fact Sheet for Healthcare Providers: SeriousBroker.it  This test is not yet approved or cleared by the [place] FDA and has been authorized for detection and/or diagnosis of SARS-CoV-2 by FDA under an Emergency Use Authorization (EUA). This EUA will remain in effect (meaning this test can be used) for the duration of the COVID-19 declaration under Section 564(b)(1) of the Act, 21 U.S.C. section 360bbb-3(b)(1), unless the authorization is terminated or revoked.  Performed at [facility], [address] [place], [place] [address]   Blood culture (routine x 2)     Status: None (Preliminary result)   Collection Time: 12/13/20  5:25 AM   Specimen: BLOOD  Result Value Ref Range Status   Specimen Description   Final    BLOOD RIGHT ANTECUBITAL Performed at [facility], [address] [place], [place] [address]    Special Requests   Final    BOTTLES DRAWN AEROBIC AND ANAEROBIC Blood Culture adequate volume Performed at [facility], [address] [place], [place] [address]    Culture   Final    NO GROWTH 2 DAYS Performed at [facility], [address], [place], [place] [address]    Report Status PENDING  Incomplete  Blood culture (routine x 2)     Status: None (Preliminary result)   Collection Time: 12/13/20  5:35 AM   Specimen: BLOOD   Result Value Ref Range Status   Specimen Description   Final    BLOOD LEFT ANTECUBITAL Performed at [facility] [address], [place], [place] [address]    Special Requests   Final    BOTTLES DRAWN AEROBIC AND ANAEROBIC Blood Culture adequate volume Performed at [facility], [address] [place], [place] [address]    Culture   Final    NO GROWTH 2 DAYS Performed at [facility], [address], [place], [place] [address]    Report Status PENDING  Incomplete  MRSA Next Gen by PCR, Nasal     Status: None   Collection Time: 12/13/20  4:33 PM   Specimen: Nasal Mucosa; Nasal Swab  Result Value Ref Range Status   MRSA by PCR Next  Gen NOT DETECTED NOT DETECTED Final    Comment: (NOTE) The GeneXpert MRSA Assay (FDA approved for NASAL specimens only), is one component of a comprehensive MRSA colonization surveillance program. It is not intended to diagnose MRSA infection nor to guide or monitor treatment for MRSA infections. Test performance is not FDA approved in patients less than 75 years old. Performed at [facility], [address] [place], [place] [address]   Respiratory (~20 pathogens) panel by PCR     Status: None   Collection Time: 12/13/20  8:00 PM   Specimen: Nasopharyngeal Swab; Respiratory  Result Value Ref Range Status   Adenovirus NOT DETECTED NOT DETECTED Final   Coronavirus 229E NOT DETECTED NOT DETECTED Final    Comment: (NOTE) The Coronavirus on the Respiratory Panel, DOES NOT test for the novel  Coronavirus (2019 nCoV)    Coronavirus HKU1 NOT DETECTED NOT DETECTED Final   Coronavirus NL63 NOT DETECTED NOT DETECTED Final   Coronavirus OC43 NOT DETECTED NOT DETECTED Final   Metapneumovirus NOT DETECTED NOT DETECTED Final   Rhinovirus / Enterovirus NOT DETECTED NOT DETECTED Final   Influenza A NOT DETECTED NOT DETECTED Final   Influenza B NOT DETECTED NOT DETECTED Final   Parainfluenza Virus 1 NOT  DETECTED NOT DETECTED Final   Parainfluenza Virus 2 NOT DETECTED NOT DETECTED Final   Parainfluenza Virus 3 NOT DETECTED NOT DETECTED Final   Parainfluenza Virus 4 NOT DETECTED NOT DETECTED Final   Respiratory Syncytial Virus NOT DETECTED NOT DETECTED Final   Bordetella pertussis NOT DETECTED NOT DETECTED Final   Bordetella Parapertussis NOT DETECTED NOT DETECTED Final   Chlamydophila pneumoniae NOT DETECTED NOT DETECTED Final   Mycoplasma pneumoniae NOT DETECTED NOT DETECTED Final    Comment: Performed at [facility], [address] [place], [place] [address]         Radiology Studies: MR CARDIAC MORPHOLOGY W WO CONTRAST  Result Date: 12/14/2020 CLINICAL DATA:  Acute myopericarditis EXAM: CARDIAC MRI TECHNIQUE: The patient was scanned on a 1.5 Tesla GE magnet. A dedicated cardiac coil was used. Functional imaging was done using Fiesta sequences. 2,3, and 4 chamber views were done to assess for RWMA's. Modified Simpson's rule using a short axis stack was used to calculate an ejection fraction on a dedicated work [employer]. The patient received 8 cc of Gadavist. After 10 minutes inversion recovery sequences were used to assess for infiltration and scar tissue. FINDINGS: Small bilateral pleural effusions.  Bibasilar airspace disease R>L. The pericardium does not appear thickened, no pericardial effusion noted. Normal left ventricular size and wall thickness, mild diffuse hypokinesis with EF 49%. T2 signal reaches 55 (elevated) in the anterior wall (normal/<54 other walls). Extracellular volume by T1 analysis 30% (mildly elevated). Normal right ventricular size and systolic function, EF 45%. Normal left and right atrial sizes. First pass perfusion appear normal. On delayed enhancement imaging, there was no myocardial late gadolinium enhancement (LGE) noted. However, the pericardium adjacent to the anterior wall of the LV appeared enhanced. MEASUREMENTS: MEASUREMENTS LVEDV  191 mL LVSV 94 mL LVEF 49% RVEDV 215 mL RVSV 97 mL RVEF 45% T2 anterior wall 55 ECV by T1 analysis 30% IMPRESSION: 1.  Normal LV size with mild diffuse hypokinesis, EF 49%. 2.  Normal RV size and systolic function, EF 45%. 3. No myocardial LGE, but there does appear to be some enhancement of the pericardium adjacent to the anterior wall. I do not see a pericardial effusion and the  pericardium does not appear thickened. 4. Mildly elevated T2 signal in the anterior wall, borderline elevated ECV measurement using T1 analysis. This study could be consistent with myopericarditis. Dalton Mclean Electronically Signed   By: Marca Ancona M.D.   On: 12/14/2020 10:49        Scheduled Meds:  aspirin  650 mg Oral Q8H   bisoprolol  2.5 mg Oral Daily   colchicine  0.6 mg Oral BID   enoxaparin (LOVENOX) injection  40 mg Subcutaneous Q24H   ivabradine  5 mg Oral Once   pantoprazole  40 mg Oral Daily   potassium chloride  20 mEq Oral BID   spironolactone  25 mg Oral Daily   Continuous Infusions:     LOS: 2 days    Time spent: 30 minutes    Dorcas Carrow, MD [facility] Pager [phone]

## 2020-12-16 DIAGNOSIS — I400 Infective myocarditis: Secondary | ICD-10-CM | POA: Diagnosis not present

## 2020-12-16 LAB — BASIC METABOLIC PANEL
Anion gap: 6 (ref 5–15)
BUN: 7 mg/dL (ref 6–20)
CO2: 27 mmol/L (ref 22–32)
Calcium: 8.5 mg/dL — ABNORMAL LOW (ref 8.9–10.3)
Chloride: 107 mmol/L (ref 98–111)
Creatinine, Ser: 0.99 mg/dL (ref 0.61–1.24)
GFR, Estimated: >60 mL/min (ref >60)
Glucose, Bld: 100 mg/dL — ABNORMAL HIGH (ref 70–99)
Potassium: 3.8 mmol/L (ref 3.5–5.1)
Sodium: 140 mmol/L (ref 135–145)

## 2020-12-16 MED ORDER — SPIRONOLACTONE 12.5 MG HALF TABLET
12.5000 mg | ORAL_TABLET | Freq: Every day | ORAL | Status: DC
  Administered 2020-12-16: 12.5 mg via ORAL
  Filled 2020-12-16: qty 1

## 2020-12-16 MED ORDER — BISOPROLOL FUMARATE 5 MG PO TABS: 2.5000 mg | ORAL_TABLET | Freq: Every day | ORAL | 0 refills | Status: DC

## 2020-12-16 MED ORDER — SPIRONOLACTONE 25 MG PO TABS: 12.5000 mg | ORAL_TABLET | Freq: Every day | ORAL | 0 refills | Status: DC

## 2020-12-16 MED ORDER — COLCHICINE 0.6 MG PO TABS: 0.6000 mg | ORAL_TABLET | Freq: Two times a day (BID) | ORAL | 0 refills | Status: DC

## 2020-12-16 MED ORDER — DOXYCYCLINE MONOHYDRATE 100 MG PO TABS: 100.0000 mg | ORAL_TABLET | Freq: Two times a day (BID) | ORAL | 0 refills | Status: AC

## 2020-12-16 MED ORDER — LOSARTAN POTASSIUM 25 MG PO TABS: 25.0000 mg | ORAL_TABLET | Freq: Every day | ORAL | Status: DC

## 2020-12-16 MED ORDER — LOSARTAN POTASSIUM 25 MG PO TABS: 25.0000 mg | ORAL_TABLET | Freq: Every day | ORAL | 0 refills | Status: DC

## 2020-12-16 MED ORDER — PANTOPRAZOLE SODIUM 40 MG PO TBEC: 40.0000 mg | DELAYED_RELEASE_TABLET | Freq: Two times a day (BID) | ORAL | 0 refills | Status: DC

## 2020-12-16 NOTE — Discharge Summary (Signed)
Physician Discharge Summary  Dale Macias JOA:[id number] DOB: [birth year] DOA: 12/12/2020  PCP: Pcp, No  Admit date: 12/12/2020 Discharge date: 12/16/2020  Admitted From: Home Disposition: Home  Recommendations for Outpatient Follow-up:  Follow up with PCP in 1-2 weeks Cardiology to schedule follow-up.  Cardiac rehab.  Home Health: Not applicable Equipment/Devices: Not applicable  Discharge Condition: Stable CODE STATUS: Full code Diet recommendation: Low-salt diet  Discharge summary: 30 year old gentleman with no medical history presented with diarrhea, vomiting whole body ache and pleuritic chest pain for 3 days.  In the emergency room he was hypotensive and febrile with temperature 103.  COVID-19 and influenza negative.  Initial troponin 1600 and elevated to 9000 with negative EKG.  Sepsis protocol was initiated, stabilized and admitted to the hospital. MRI consistent with myopericarditis.  Patient had COVID-19 infection 3 times in the past, last one a month ago.  Retrospectively complains of episodic mild symptoms for more than a month.     SIRS with febrile illness: Due to viral myopericarditis and bilateral multifocal pneumonia. No evidence of bacterial infection.  Adequately resuscitated and clinically stabilizing.  Cultures negative so far.  Viral panel negative.   This is probably due to recent COVID-19 infection.  Repeat COVID-19 test negative.   Elevated troponins due to myopericarditis. COVID-19 and influenza negative.  Probably due to earlier COVID-19 infection. Viral panel negative. Clinically stabilizing and symptoms are improving. On high-dose aspirin and colchicine.  Protonix added. Followed by cardiology. Cardiac MRI with myopericarditis.  Uncomplicated. Coronary CT with no coronary obstruction.    Acute systolic congestive heart failure: Likely due to myopericarditis.  Ejection fraction 45%.  Euvolemic on exam.  Troponin 9000 and downtrending. On high-dose  aspirin.  Started on Aldactone and bisoprolol.  Followed by cardiology. Potassium adequate.   Plan: Adequately stabilized.  Asymptomatic today. Patient discharged home on colchicine, Protonix, Aldactone, bisoprolol.  Euvolemic today. Empiric doxycycline for 7 days, likely viral pneumonia. Activity restriction, no extraneous exercise for 6 months. Cardiac rehab to follow.    Discharge Diagnoses:  Active Problems:   SIRS (systemic inflammatory response syndrome) (HCC)   Febrile illness   Myocarditis [facility])    Discharge Instructions  Discharge Instructions     Call MD for:  difficulty breathing, headache or visual disturbances   Complete by: As directed    Call MD for:  persistant nausea and vomiting   Complete by: As directed    Call MD for:  severe uncontrolled pain   Complete by: As directed    Call MD for:  temperature >100.4   Complete by: As directed    Diet - low sodium heart healthy   Complete by: As directed    Discharge instructions   Complete by: As directed    You can take aspirin or ibuprofen if any chest pain, fever .   Increase activity slowly   Complete by: As directed    Other Restrictions   Complete by: As directed    No strenuous activities for 6 months or as directed by cardiologist in follow up      Allergies as of 12/16/2020       Reactions   Benzonatate Swelling   Throat swelling        Medication List     STOP taking these medications    promethazine 25 MG tablet Commonly known as: PHENERGAN       TAKE these medications    bisoprolol 5 MG tablet Commonly known as: ZEBETA Take 0.5 tablets (2.5  mg total) by mouth daily.   colchicine 0.6 MG tablet Take 1 tablet (0.6 mg total) by mouth 2 (two) times daily.   doxycycline 100 MG tablet Commonly known as: ADOXA Take 1 tablet (100 mg total) by mouth 2 (two) times daily for 6 days.   losartan 25 MG tablet Commonly known as: COZAAR Take 1 tablet (25 mg total) by mouth at  bedtime.   pantoprazole 40 MG tablet Commonly known as: PROTONIX Take 1 tablet (40 mg total) by mouth 2 (two) times daily before a meal.   spironolactone 25 MG tablet Commonly known as: ALDACTONE Take 0.5 tablets (12.5 mg total) by mouth daily.        Allergies  Allergen Reactions   Benzonatate Swelling    Throat swelling    Consultations: Cardiology   Procedures/Studies: DG Chest 2 View  Result Date: 12/13/2020 CLINICAL DATA:  Shortness of breath EXAM: CHEST - 2 VIEW COMPARISON:  09/12/2009 FINDINGS: Artifact from EKG leads. Normal heart size and mediastinal contours. No acute infiltrate or edema. No effusion or pneumothorax. No acute osseous findings. IMPRESSION: No active cardiopulmonary disease. Electronically Signed   By: Tiburcio Pea M.D.   On: 12/13/2020 06:54   CT CORONARY MORPH W/CTA COR W/SCORE W/CA W/CM &/OR WO/CM  Addendum Date: 12/15/2020   ADDENDUM REPORT: 12/15/2020 14:53 CLINICAL DATA:  Elevated Troponin Abnormal ECG?  Myocarditis vs CAD EXAM: Cardiac CTA MEDICATIONS: Sub lingual nitro. 4 mg and Ivabradine 5 mg TECHNIQUE: The patient was scanned on a [employer] 192 scanner. Gantry rotation speed was 250 msecs. Collimation was. 6 mm . A 120 kV prospective scan was triggered in the ascending thoracic aorta at 140 HU's with full mA between 30-70% of the R-R interval . Average HR during the scan was 68 bpm. The 3D data set was interpreted on a dedicated work station using MPR, MIP and VRT modes. A total of 80 cc of contrast was used. FINDINGS: Non-cardiac: See separate report from [facility]. No significant findings on limited lung and soft tissue windows. Calcium score: No calcium noted Coronary Arteries: Right dominant with no anomalies LM: Normal LAD: Normal IM: Normal D1: Normal D2: Normal Circumflex: Normal OM1: Normal OM2: Normal RCA: Normal PDA: Normal PLA: Normal No pericardial effusion and thickness appears normal IMPRESSION: 1. Normal right  dominant coronary arteries CAD RADS 0 2.  Calcium score 0 3.  Normal appearing pericardium with no effusion 4.  Normal aortic root 3.2 cm 5.  See radiology report regarding bilateral bronchopneumonia Charlton Haws Electronically Signed   By: Charlton Haws M.D.   On: 12/15/2020 14:53   Result Date: 12/15/2020 EXAM: OVER-READ INTERPRETATION  CT CHEST The following report is an over-read performed by radiologist Dr. Trudie Reed of [facility] on 12/15/2020. This over-read does not include interpretation of cardiac or coronary anatomy or pathology. The coronary calcium score/coronary CTA interpretation by the cardiologist is attached. COMPARISON:  None. FINDINGS: Patchy areas of ground-glass attenuation and peribronchovascular airspace consolidation are noted in the mid to lower lungs, most evident in the lower lobes. Small bilateral pleural effusions lying dependently. Within the visualized portions of the thorax there are no suspicious appearing pulmonary nodules or masses, no pneumothorax and no lymphadenopathy. Visualized portions of the upper abdomen are unremarkable. There are no aggressive appearing lytic or blastic lesions noted in the visualized portions of the skeleton. IMPRESSION: 1. The appearance of the chest suggests multilobar bilateral bronchopneumonia, or potentially sequela of recent aspiration. Associated with this there  are small bilateral parapneumonic pleural effusions lying dependently. Electronically Signed: By: Trudie Reed M.D. On: 12/15/2020 14:25   MR CARDIAC MORPHOLOGY W WO CONTRAST  Result Date: 12/14/2020 CLINICAL DATA:  Acute myopericarditis EXAM: CARDIAC MRI TECHNIQUE: The patient was scanned on a 1.5 Tesla GE magnet. A dedicated cardiac coil was used. Functional imaging was done using Fiesta sequences. 2,3, and 4 chamber views were done to assess for RWMA's. Modified Simpson's rule using a short axis stack was used to calculate an ejection fraction on a  dedicated work [employer]. The patient received 8 cc of Gadavist. After 10 minutes inversion recovery sequences were used to assess for infiltration and scar tissue. FINDINGS: Small bilateral pleural effusions.  Bibasilar airspace disease R>L. The pericardium does not appear thickened, no pericardial effusion noted. Normal left ventricular size and wall thickness, mild diffuse hypokinesis with EF 49%. T2 signal reaches 55 (elevated) in the anterior wall (normal/<54 other walls). Extracellular volume by T1 analysis 30% (mildly elevated). Normal right ventricular size and systolic function, EF 45%. Normal left and right atrial sizes. First pass perfusion appear normal. On delayed enhancement imaging, there was no myocardial late gadolinium enhancement (LGE) noted. However, the pericardium adjacent to the anterior wall of the LV appeared enhanced. MEASUREMENTS: MEASUREMENTS LVEDV 191 mL LVSV 94 mL LVEF 49% RVEDV 215 mL RVSV 97 mL RVEF 45% T2 anterior wall 55 ECV by T1 analysis 30% IMPRESSION: 1.  Normal LV size with mild diffuse hypokinesis, EF 49%. 2.  Normal RV size and systolic function, EF 45%. 3. No myocardial LGE, but there does appear to be some enhancement of the pericardium adjacent to the anterior wall. I do not see a pericardial effusion and the pericardium does not appear thickened. 4. Mildly elevated T2 signal in the anterior wall, borderline elevated ECV measurement using T1 analysis. This study could be consistent with myopericarditis. Dalton Mclean Electronically Signed   By: Marca Ancona M.D.   On: 12/14/2020 10:49   ECHOCARDIOGRAM COMPLETE  Result Date: 12/13/2020    ECHOCARDIOGRAM REPORT   Patient Name:   MATAEO INGWERSEN Date of Exam: 12/13/2020 Medical Rec #:  [id number]          Height:       71.0 in Accession #:    [id number]         Weight:       200.0 lb Date of Birth:  1990-10-18          BSA:          2.109 m Patient Age:    30 years           BP:           98/68 mmHg  Patient Gender: M                  HR:           94 bpm. Exam Location:  Inpatient Procedure: 2D Echo, Cardiac Doppler, Color Doppler and 3D Echo STAT ECHO Indications:    Elevated troponin. Chest pain  History:        Patient has no prior history of Echocardiogram examinations.                 Signs/Symptoms:Shortness of Breath and Chest Pain. Has had Covid                 19 three times, unvaccinated. Diarhea.  Sonographer:    Roosvelt Maser RDCS Referring Phys: [phone]  TYRONE A KYLE IMPRESSIONS  1. Left ventricular ejection fraction, by estimation, is approximately 45%. The left ventricle has mildly decreased function. The left ventricle demonstrates regional wall motion abnormalities (see scoring diagram/findings for description). There is mild left ventricular hypertrophy. Left ventricular diastolic parameters were normal.  2. Right ventricular systolic function is normal. The right ventricular size is normal. Tricuspid regurgitation signal is inadequate for assessing PA pressure.  3. The mitral valve is grossly normal. Trivial mitral valve regurgitation.  4. The aortic valve is tricuspid. Aortic valve regurgitation is not visualized. No aortic stenosis is present. Aortic valve mean gradient measures 2.0 mmHg.  5. The inferior vena cava is normal in size with <50% respiratory variability, suggesting right atrial pressure of 8 mmHg. Comparison(s): No prior Echocardiogram. FINDINGS  Left Ventricle: Left ventricular ejection fraction, by estimation, is 45%. The left ventricle has mildly decreased function. The left ventricle demonstrates regional wall motion abnormalities. The left ventricular internal cavity size was normal in size. There is mild left ventricular hypertrophy. Left ventricular diastolic parameters were normal.  LV Wall Scoring: The mid and distal anterior septum and apex are hypokinetic. The entire anterior wall, entire lateral wall, entire inferior wall, basal anteroseptal segment, mid inferoseptal  segment, and basal inferoseptal segment are normal. Right Ventricle: The right ventricular size is normal. No increase in right ventricular wall thickness. Right ventricular systolic function is normal. Tricuspid regurgitation signal is inadequate for assessing PA pressure. Left Atrium: Left atrial size was normal in size. Right Atrium: Right atrial size was normal in size. Pericardium: There is no evidence of pericardial effusion. Mitral Valve: The mitral valve is grossly normal. Trivial mitral valve regurgitation. Tricuspid Valve: The tricuspid valve is grossly normal. Tricuspid valve regurgitation is trivial. Aortic Valve: The aortic valve is tricuspid. Aortic valve regurgitation is not visualized. No aortic stenosis is present. Aortic valve mean gradient measures 2.0 mmHg. Aortic valve peak gradient measures 3.5 mmHg. Pulmonic Valve: The pulmonic valve was grossly normal. Pulmonic valve regurgitation is trivial. Aorta: The aortic root is normal in size and structure. Venous: The inferior vena cava is normal in size with less than 50% respiratory variability, suggesting right atrial pressure of 8 mmHg. IAS/Shunts: No atrial level shunt detected by color flow Doppler.   LV Volumes (MOD) LV vol d, MOD A2C: 130.0 ml Diastology LV vol d, MOD A4C: 126.0 ml LV e' medial:    10.70 cm/s LV vol s, MOD A2C: 73.6 ml  LV E/e' medial:  6.8 LV vol s, MOD A4C: 72.7 ml  LV e' lateral:   10.00 cm/s LV SV MOD A2C:     56.4 ml  LV E/e' lateral: 7.2 LV SV MOD A4C:     126.0 ml LV SV MOD BP:      57.7 ml                              3D Volume EF:                             3D EF:        50 %                             LV EDV:       146 ml  LV ESV:       73 ml                             LV SV:        73 ml RIGHT VENTRICLE          IVC RV Basal diam:  2.90 cm  IVC diam: 2.00 cm LEFT ATRIUM           Index       RIGHT ATRIUM           Index LA Vol (A2C): 52.4 ml 24.85 ml/m RA Area:     11.60 cm                                    RA Volume:   27.90 ml  13.23 ml/m  AORTIC VALVE AV Vmax:           93.90 cm/s AV Vmean:          67.300 cm/s AV VTI:            0.172 m AV Peak Grad:      3.5 mmHg AV Mean Grad:      2.0 mmHg LVOT Vmax:         78.40 cm/s LVOT Vmean:        55.300 cm/s LVOT VTI:          0.149 m LVOT/AV VTI ratio: 0.87 MITRAL VALVE MV Area (PHT): 5.42 cm    SHUNTS MV Decel Time: 140 msec    Systemic VTI: 0.15 m MV E velocity: 72.40 cm/s MV A velocity: 50.10 cm/s MV E/A ratio:  1.45 Nona Dell MD Electronically signed by Nona Dell MD Signature Date/Time: 12/13/2020/11:36:47 AM    Final    (Echo, Carotid, EGD, Colonoscopy, ERCP)    Subjective: Patient seen and examined.  Wife at the bedside.  Walked around in the hallway.  Has some dry cough otherwise denies any complaints.  No chest pain or shortness of breath.   Afebrile.   Discharge Exam: Vitals:   12/16/20 0322 12/16/20 0749  BP: 108/75 118/77  Pulse: 74 67  Resp: 20 17  Temp: 98 F (36.7 C) 98.6 F (37 C)  SpO2: 92% 95%   Vitals:   12/15/20 1958 12/16/20 0015 12/16/20 0322 12/16/20 0749  BP: 113/77 104/69 108/75 118/77  Pulse: 70 73 74 67  Resp: Temp: 99.4 F (37.4 C) 98.7 F (37.1 C) 98 F (36.7 C) 98.6 F (37 C)  TempSrc: Oral Oral Oral Oral  SpO2: 92% 93% 92% 95%  Weight:      Height:        General: Pt is alert, awake, not in acute distress Cardiovascular: RRR, S1/S2 +, no rubs, no gallops Respiratory: CTA bilaterally, no wheezing, no rhonchi Abdominal: Soft, NT, ND, bowel sounds + Extremities: no edema, no cyanosis    The results of significant diagnostics from this hospitalization (including imaging, microbiology, ancillary and laboratory) are listed below for reference.     Microbiology: Recent Results (from the past 240 hour(s))  Resp Panel by RT-PCR (Flu A&B, Covid) Nasopharyngeal Swab     Status: None   Collection Time: 12/13/20  1:40 AM   Specimen: Nasopharyngeal Swab;  Nasopharyngeal(NP) swabs in vial transport medium  Result Value Ref Range Status   SARS Coronavirus 2  by RT PCR NEGATIVE NEGATIVE Final    Comment: (NOTE) SARS-CoV-2 target nucleic acids are NOT DETECTED.  The SARS-CoV-2 RNA is generally detectable in upper respiratory specimens during the acute phase of infection. The lowest concentration of SARS-CoV-2 viral copies this assay can detect is 138 copies/mL. A negative result does not preclude SARS-Cov-2 infection and should not be used as the sole basis for treatment or other patient management decisions. A negative result may occur with  improper specimen collection/handling, submission of specimen other than nasopharyngeal swab, presence of viral mutation(s) within the areas targeted by this assay, and inadequate number of viral copies(<138 copies/mL). A negative result must be combined with clinical observations, patient history, and epidemiological information. The expected result is Negative.  Fact Sheet for Patients:  BloggerCourse.com  Fact Sheet for Healthcare Providers:  SeriousBroker.it  This test is no t yet approved or cleared by the [place] FDA and  has been authorized for detection and/or diagnosis of SARS-CoV-2 by FDA under an Emergency Use Authorization (EUA). This EUA will remain  in effect (meaning this test can be used) for the duration of the COVID-19 declaration under Section 564(b)(1) of the Act, 21 U.S.C.section 360bbb-3(b)(1), unless the authorization is terminated  or revoked sooner.       Influenza A by PCR NEGATIVE NEGATIVE Final   Influenza B by PCR NEGATIVE NEGATIVE Final    Comment: (NOTE) The Xpert Xpress SARS-CoV-2/FLU/RSV plus assay is intended as an aid in the diagnosis of influenza from Nasopharyngeal swab specimens and should not be used as a sole basis for treatment. Nasal washings and aspirates are unacceptable for Xpert Xpress  SARS-CoV-2/FLU/RSV testing.  Fact Sheet for Patients: BloggerCourse.com  Fact Sheet for Healthcare Providers: SeriousBroker.it  This test is not yet approved or cleared by the [place] FDA and has been authorized for detection and/or diagnosis of SARS-CoV-2 by FDA under an Emergency Use Authorization (EUA). This EUA will remain in effect (meaning this test can be used) for the duration of the COVID-19 declaration under Section 564(b)(1) of the Act, 21 U.S.C. section 360bbb-3(b)(1), unless the authorization is terminated or revoked.  Performed at [facility], [address] [place], [place] [address]   Blood culture (routine x 2)     Status: None (Preliminary result)   Collection Time: 12/13/20  5:25 AM   Specimen: BLOOD  Result Value Ref Range Status   Specimen Description   Final    BLOOD RIGHT ANTECUBITAL Performed at [facility], [address], [place], [place] [address]    Special Requests   Final    BOTTLES DRAWN AEROBIC AND ANAEROBIC Blood Culture adequate volume Performed at [facility], [address] [place], [place] [address]    Culture   Final    NO GROWTH 3 DAYS Performed at [facility], [address] [place], [place] [address]    Report Status PENDING  Incomplete  Blood culture (routine x 2)     Status: None (Preliminary result)   Collection Time: 12/13/20  5:35 AM   Specimen: BLOOD  Result Value Ref Range Status   Specimen Description   Final    BLOOD LEFT ANTECUBITAL Performed at [facility], [address] [place], [place] [address]    Special Requests   Final    BOTTLES DRAWN AEROBIC AND ANAEROBIC Blood Culture adequate volume Performed at [facility], [address] [place], [place] [address]  Culture   Final    NO GROWTH 3 DAYS Performed at [facility], [address] [place], [place] [address]    Report Status PENDING  Incomplete  MRSA Next Gen by PCR, Nasal     Status: None   Collection Time: 12/13/20  4:33 PM   Specimen: Nasal Mucosa; Nasal Swab  Result Value Ref Range Status   MRSA by PCR Next Gen NOT DETECTED NOT DETECTED Final    Comment: (NOTE) The GeneXpert MRSA Assay (FDA approved for NASAL specimens only), is one component of a comprehensive MRSA colonization surveillance program. It is not intended to diagnose MRSA infection nor to guide or monitor treatment for MRSA infections. Test performance is not FDA approved in patients less than 27 years old. Performed at [facility], [address], [place], [place] [address]   Respiratory (~20 pathogens) panel by PCR     Status: None   Collection Time: 12/13/20  8:00 PM   Specimen: Nasopharyngeal Swab; Respiratory  Result Value Ref Range Status   Adenovirus NOT DETECTED NOT DETECTED Final   Coronavirus 229E NOT DETECTED NOT DETECTED Final    Comment: (NOTE) The Coronavirus on the Respiratory Panel, DOES NOT test for the novel  Coronavirus (2019 nCoV)    Coronavirus HKU1 NOT DETECTED NOT DETECTED Final   Coronavirus NL63 NOT DETECTED NOT DETECTED Final   Coronavirus OC43 NOT DETECTED NOT DETECTED Final   Metapneumovirus NOT DETECTED NOT DETECTED Final   Rhinovirus / Enterovirus NOT DETECTED NOT DETECTED Final   Influenza A NOT DETECTED NOT DETECTED Final   Influenza B NOT DETECTED NOT DETECTED Final   Parainfluenza Virus 1 NOT DETECTED NOT DETECTED Final   Parainfluenza Virus 2 NOT DETECTED NOT DETECTED Final   Parainfluenza Virus 3 NOT DETECTED NOT DETECTED Final   Parainfluenza Virus 4 NOT DETECTED NOT DETECTED Final   Respiratory Syncytial Virus NOT DETECTED NOT DETECTED Final   Bordetella pertussis NOT DETECTED NOT DETECTED Final   Bordetella Parapertussis NOT DETECTED NOT DETECTED Final   Chlamydophila pneumoniae NOT DETECTED NOT DETECTED Final   Mycoplasma pneumoniae NOT  DETECTED NOT DETECTED Final    Comment: Performed at [facility], [address], [place], [place] [address]     Labs: BNP (last 3 results) Recent Labs    12/13/20 0610  BNP 34.5   Basic Metabolic Panel: Recent Labs  Lab 12/13/20 0001 12/14/20 0013 12/15/20 0036 12/16/20 0023  NA 135 136 140 140  K 4.1 3.4* 3.7 3.8  CL 101 105 108 107  CO2 GLUCOSE 124* 123* 100* 100*  BUN 13 9 5* 7  CREATININE 1.18 1.23 1.03 0.99  CALCIUM 8.7* 7.9* 8.2* 8.5*  MG 1.9  --   --   --    Liver Function Tests: Recent Labs  Lab 12/13/20 0001 12/14/20 0013  AST 38 57*  ALT 30 26  ALKPHOS 43 30*  BILITOT 0.8 0.8  PROT 7.1 5.2*  ALBUMIN 4.2 2.8*   Recent Labs  Lab 12/13/20 0001  LIPASE 27   No results for input(s): AMMONIA in the last 168 hours. CBC: Recent Labs  Lab 12/13/20 0001 12/14/20 0013  WBC 10.2 10.6*  NEUTROABS  --  8.3*  HGB 14.4 12.2*  HCT 42.6 35.3*  MCV 86.9 85.7  PLT 178 161   Cardiac Enzymes: Recent Labs  Lab 12/13/20 0807 12/14/20 0013  CKTOTAL 147 410*   BNP: Invalid input(s): POCBNP  CBG: No results for input(s): GLUCAP in the last 168 hours. D-Dimer No results for input(s): DDIMER in the last 72 hours. Hgb A1c No results for input(s): HGBA1C in the last 72 hours. Lipid Profile No results for input(s): CHOL, HDL, LDLCALC, TRIG, CHOLHDL, LDLDIRECT in the last 72 hours. Thyroid function studies No results for input(s): TSH, T4TOTAL, T3FREE, THYROIDAB in the last 72 hours.  Invalid input(s): FREET3 Anemia work up No results for input(s): VITAMINB12, FOLATE, FERRITIN, TIBC, IRON, RETICCTPCT in the last 72 hours. Urinalysis    Component Value Date/Time   COLORURINE YELLOW (A) 12/13/2020 0001   APPEARANCEUR CLEAR (A) 12/13/2020 0001   LABSPEC 1.010 12/13/2020 0001   PHURINE 6.0 12/13/2020 0001   GLUCOSEU NEGATIVE 12/13/2020 0001   HGBUR NEGATIVE 12/13/2020 0001   BILIRUBINUR NEGATIVE 12/13/2020 0001   KETONESUR TRACE (A)  12/13/2020 0001   PROTEINUR NEGATIVE 12/13/2020 0001   NITRITE NEGATIVE 12/13/2020 0001   LEUKOCYTESUR NEGATIVE 12/13/2020 0001   Sepsis Labs Invalid input(s): PROCALCITONIN,  WBC,  LACTICIDVEN Microbiology Recent Results (from the past 240 hour(s))  Resp Panel by RT-PCR (Flu A&B, Covid) Nasopharyngeal Swab     Status: None   Collection Time: 12/13/20  1:40 AM   Specimen: Nasopharyngeal Swab; Nasopharyngeal(NP) swabs in vial transport medium  Result Value Ref Range Status   SARS Coronavirus 2 by RT PCR NEGATIVE NEGATIVE Final    Comment: (NOTE) SARS-CoV-2 target nucleic acids are NOT DETECTED.  The SARS-CoV-2 RNA is generally detectable in upper respiratory specimens during the acute phase of infection. The lowest concentration of SARS-CoV-2 viral copies this assay can detect is 138 copies/mL. A negative result does not preclude SARS-Cov-2 infection and should not be used as the sole basis for treatment or other patient management decisions. A negative result may occur with  improper specimen collection/handling, submission of specimen other than nasopharyngeal swab, presence of viral mutation(s) within the areas targeted by this assay, and inadequate number of viral copies(<138 copies/mL). A negative result must be combined with clinical observations, patient history, and epidemiological information. The expected result is Negative.  Fact Sheet for Patients:  BloggerCourse.com  Fact Sheet for Healthcare Providers:  SeriousBroker.it  This test is no t yet approved or cleared by the [place] FDA and  has been authorized for detection and/or diagnosis of SARS-CoV-2 by FDA under an Emergency Use Authorization (EUA). This EUA will remain  in effect (meaning this test can be used) for the duration of the COVID-19 declaration under Section 564(b)(1) of the Act, 21 U.S.C.section 360bbb-3(b)(1), unless the authorization is  terminated  or revoked sooner.       Influenza A by PCR NEGATIVE NEGATIVE Final   Influenza B by PCR NEGATIVE NEGATIVE Final    Comment: (NOTE) The Xpert Xpress SARS-CoV-2/FLU/RSV plus assay is intended as an aid in the diagnosis of influenza from Nasopharyngeal swab specimens and should not be used as a sole basis for treatment. Nasal washings and aspirates are unacceptable for Xpert Xpress SARS-CoV-2/FLU/RSV testing.  Fact Sheet for Patients: BloggerCourse.com  Fact Sheet for Healthcare Providers: SeriousBroker.it  This test is not yet approved or cleared by the [place] FDA and has been authorized for detection and/or diagnosis of SARS-CoV-2 by FDA under an Emergency Use Authorization (EUA). This EUA will remain in effect (meaning this test can be used) for the duration of the COVID-19 declaration under Section 564(b)(1) of the Act, 21 U.S.C. section 360bbb-3(b)(1), unless the authorization is terminated or revoked.  Performed at [facility]  [facility], [address], [place], [place] [address]   Blood culture (routine x 2)     Status: None (Preliminary result)   Collection Time: 12/13/20  5:25 AM   Specimen: BLOOD  Result Value Ref Range Status   Specimen Description   Final    BLOOD RIGHT ANTECUBITAL Performed at [facility], [address], [place], [place] [address]    Special Requests   Final    BOTTLES DRAWN AEROBIC AND ANAEROBIC Blood Culture adequate volume Performed at [facility], [address], [place], [place] [address]    Culture   Final    NO GROWTH 3 DAYS Performed at [facility], [address] [place], [place] [address]    Report Status PENDING  Incomplete  Blood culture (routine x 2)     Status: None (Preliminary result)   Collection Time: 12/13/20  5:35 AM   Specimen: BLOOD  Result Value Ref Range Status   Specimen Description    Final    BLOOD LEFT ANTECUBITAL Performed at [facility], [address], [place], [place] [address]    Special Requests   Final    BOTTLES DRAWN AEROBIC AND ANAEROBIC Blood Culture adequate volume Performed at [facility], [address], [place], [place] [address]    Culture   Final    NO GROWTH 3 DAYS Performed at [facility], [address], [place], [place] [address]    Report Status PENDING  Incomplete  MRSA Next Gen by PCR, Nasal     Status: None   Collection Time: 12/13/20  4:33 PM   Specimen: Nasal Mucosa; Nasal Swab  Result Value Ref Range Status   MRSA by PCR Next Gen NOT DETECTED NOT DETECTED Final    Comment: (NOTE) The GeneXpert MRSA Assay (FDA approved for NASAL specimens only), is one component of a comprehensive MRSA colonization surveillance program. It is not intended to diagnose MRSA infection nor to guide or monitor treatment for MRSA infections. Test performance is not FDA approved in patients less than 25 years old. Performed at [facility], [address], [place], [place] [address]   Respiratory (~20 pathogens) panel by PCR     Status: None   Collection Time: 12/13/20  8:00 PM   Specimen: Nasopharyngeal Swab; Respiratory  Result Value Ref Range Status   Adenovirus NOT DETECTED NOT DETECTED Final   Coronavirus 229E NOT DETECTED NOT DETECTED Final    Comment: (NOTE) The Coronavirus on the Respiratory Panel, DOES NOT test for the novel  Coronavirus (2019 nCoV)    Coronavirus HKU1 NOT DETECTED NOT DETECTED Final   Coronavirus NL63 NOT DETECTED NOT DETECTED Final   Coronavirus OC43 NOT DETECTED NOT DETECTED Final   Metapneumovirus NOT DETECTED NOT DETECTED Final   Rhinovirus / Enterovirus NOT DETECTED NOT DETECTED Final   Influenza A NOT DETECTED NOT DETECTED Final   Influenza B NOT DETECTED NOT DETECTED Final   Parainfluenza Virus 1 NOT DETECTED NOT DETECTED Final   Parainfluenza Virus 2 NOT  DETECTED NOT DETECTED Final   Parainfluenza Virus 3 NOT DETECTED NOT DETECTED Final   Parainfluenza Virus 4 NOT DETECTED NOT DETECTED Final   Respiratory Syncytial Virus NOT DETECTED NOT DETECTED Final   Bordetella pertussis NOT DETECTED NOT DETECTED Final   Bordetella Parapertussis NOT DETECTED NOT DETECTED Final   Chlamydophila pneumoniae NOT DETECTED NOT DETECTED Final   Mycoplasma pneumoniae NOT DETECTED NOT DETECTED  Final    Comment: Performed at [facility], [address], [place], [place] [address]     Time coordinating discharge:  32 minutes  SIGNED:   Dorcas Carrow, MD  [facility] 12/16/2020, 10:21 AM

## 2020-12-16 NOTE — Progress Notes (Addendum)
Patient ID: Dale Macias, male   DOB: Jul 09, 1990, 30 y.o.   MRN: [id number]     Advanced Heart Failure Rounding Note  PCP-Cardiologist: None   Subjective:    Feeling better. Walking hall.  CT with no CAD. Bilateral bronchPNA with small surrounding effusions  Objective:   Weight Range: 90.7 kg Body mass index is 27.89 kg/m.   Vital Signs:   Temp:  [98 F (36.7 C)-99.4 F (37.4 C)] 98 F (36.7 C) (09/20 0322) Pulse Rate:  [70-90] 74 (09/20 0322) Resp:  [19-27] 20 (09/20 0322) BP: (103-130)/(59-77) 108/75 (09/20 0322) SpO2:  [92 %-95 %] 92 % (09/20 0322) Last BM Date: 12/15/20  Weight change: Filed Weights   12/12/20 2345  Weight: 90.7 kg    Intake/Output:  No intake or output data in the 24 hours ending 12/16/20 0702     Physical Exam    General:  Well appearing. No resp difficulty HEENT: normal Neck: supple. no JVD. Carotids 2+ bilat; no bruits. No lymphadenopathy or thryomegaly appreciated. Cor: PMI nondisplaced. Regular rate & rhythm. No rubs, gallops or murmurs. Lungs: clear Abdomen: soft, nontender, nondistended. No hepatosplenomegaly. No bruits or masses. Good bowel sounds. Extremities: no cyanosis, clubbing, rash, edema Neuro: alert & orientedx3, cranial nerves grossly intact. moves all 4 extremities w/o difficulty. Affect pleasant  Telemetry   SR 70-80s personally reviewed.   Labs    CBC Recent Labs    12/14/20 0013  WBC 10.6*  NEUTROABS 8.3*  HGB 12.2*  HCT 35.3*  MCV 85.7  PLT 161    Basic Metabolic Panel Recent Labs    27/06/23 0036 12/16/20 0023  NA 140 140  K 3.7 3.8  CL 108 107  CO2 24 27  GLUCOSE 100* 100*  BUN 5* 7  CREATININE 1.03 0.99  CALCIUM 8.2* 8.5*    Liver Function Tests Recent Labs    12/14/20 0013  AST 57*  ALT 26  ALKPHOS 30*  BILITOT 0.8  PROT 5.2*  ALBUMIN 2.8*    No results for input(s): LIPASE, AMYLASE in the last 72 hours.  Cardiac Enzymes Recent Labs    12/13/20 0807 12/14/20 0013   CKTOTAL 147 410*     BNP: BNP (last 3 results) Recent Labs    12/13/20 0610  BNP 34.5     ProBNP (last 3 results) No results for input(s): PROBNP in the last 8760 hours.   D-Dimer Recent Labs    12/13/20 0807  DDIMER 0.36    Hemoglobin A1C No results for input(s): HGBA1C in the last 72 hours. Fasting Lipid Panel No results for input(s): CHOL, HDL, LDLCALC, TRIG, CHOLHDL, LDLDIRECT in the last 72 hours. Thyroid Function Tests No results for input(s): TSH, T4TOTAL, T3FREE, THYROIDAB in the last 72 hours.  Invalid input(s): FREET3  Other results:   Imaging    CT CORONARY MORPH W/CTA COR W/SCORE W/CA W/CM &/OR WO/CM  Addendum Date: 12/15/2020   ADDENDUM REPORT: 12/15/2020 14:53 CLINICAL DATA:  Elevated Troponin Abnormal ECG?  Myocarditis vs CAD EXAM: Cardiac CTA MEDICATIONS: Sub lingual nitro. 4 mg and Ivabradine 5 mg TECHNIQUE: The patient was scanned on a [employer] 192 scanner. Gantry rotation speed was 250 msecs. Collimation was. 6 mm . A 120 kV prospective scan was triggered in the ascending thoracic aorta at 140 HU's with full mA between 30-70% of the R-R interval . Average HR during the scan was 68 bpm. The 3D data set was interpreted on a dedicated work station using MPR, MIP  and VRT modes. A total of 80 cc of contrast was used. FINDINGS: Non-cardiac: See separate report from [facility]. No significant findings on limited lung and soft tissue windows. Calcium score: No calcium noted Coronary Arteries: Right dominant with no anomalies LM: Normal LAD: Normal IM: Normal D1: Normal D2: Normal Circumflex: Normal OM1: Normal OM2: Normal RCA: Normal PDA: Normal PLA: Normal No pericardial effusion and thickness appears normal IMPRESSION: 1. Normal right dominant coronary arteries CAD RADS 0 2.  Calcium score 0 3.  Normal appearing pericardium with no effusion 4.  Normal aortic root 3.2 cm 5.  See radiology report regarding bilateral bronchopneumonia Charlton Haws  Electronically Signed   By: Charlton Haws M.D.   On: 12/15/2020 14:53   Result Date: 12/15/2020 EXAM: OVER-READ INTERPRETATION  CT CHEST The following report is an over-read performed by radiologist Dr. Trudie Reed of [facility] on 12/15/2020. This over-read does not include interpretation of cardiac or coronary anatomy or pathology. The coronary calcium score/coronary CTA interpretation by the cardiologist is attached. COMPARISON:  None. FINDINGS: Patchy areas of ground-glass attenuation and peribronchovascular airspace consolidation are noted in the mid to lower lungs, most evident in the lower lobes. Small bilateral pleural effusions lying dependently. Within the visualized portions of the thorax there are no suspicious appearing pulmonary nodules or masses, no pneumothorax and no lymphadenopathy. Visualized portions of the upper abdomen are unremarkable. There are no aggressive appearing lytic or blastic lesions noted in the visualized portions of the skeleton. IMPRESSION: 1. The appearance of the chest suggests multilobar bilateral bronchopneumonia, or potentially sequela of recent aspiration. Associated with this there are small bilateral parapneumonic pleural effusions lying dependently. Electronically Signed: By: Trudie Reed M.D. On: 12/15/2020 14:25     Medications:     Scheduled Medications:  aspirin  650 mg Oral Q8H   bisoprolol  2.5 mg Oral Daily   colchicine  0.6 mg Oral BID   enoxaparin (LOVENOX) injection  40 mg Subcutaneous Q24H   pantoprazole  40 mg Oral BID AC   spironolactone  25 mg Oral Daily    Infusions:  doxycycline (VIBRAMYCIN) IV 100 mg (12/16/20 0427)    PRN Medications: acetaminophen **OR** acetaminophen, ondansetron **OR** ondansetron (ZOFRAN) IV, oxyCODONE    Assessment/Plan   1. Suspect acute myopericarditis: Cardiac MRI with LV EF 49%, normal RV, no myocardial LGE but mildly elevated T2 signal anterior wall and appears to be LGE in the  pericardium adjacent to the anterior wall.  ECG with mild diffuse ST elevation.  HS-TnI up to 9716.  Most likely acute myopericarditis.  Question if this is related to COVID-19 infection that he had about a month ago, has had on and off symptoms for a number of weeks, more severe pleuritic chest pain recently.  Pleuritic chest pain resolving.   - Continue colchicine 0.6 bid.  - Continue ASA 650 q8 hrs.  - Continue PPI  - Activity limitation x 6 months, discussed.  - Coronary CT no CAD. +PNA   - Continue bisoprolol 2.5 - Continue spiro 25 - Add losartan 25 at night  2. PNA - Per [facility]. + PNA on CT - vancomycin/cefepime -> doxy   Ok for d/c from cardiac perspective on  - Bisoprolol 2.5 daily - Spiro 12.5 daily - Losartan 25 at night - Colchicine 0.6 bid  Will arrange f/u in HF Clinic. No strenuous activity x 6 months   AHF team will sign off  Length of Stay: 3  Arvilla Meres, MD  12/16/2020, 7:02 AM  Advanced Heart Failure Team Pager [phone] (M-F; 7a - 5p)  Please contact [facility] for night-coverage after hours (5p -7a ) and weekends on [email or web address]

## 2020-12-16 NOTE — Progress Notes (Signed)
[phone] Gave pt and wife CHF booklet and low sodium handouts. Discussed signs/symptoms of CHF and when to call MD. Discussed daily weights,2000 mg sodium restriction, 2LFR. Gave walking instructions for ex. Encouraged pt to discuss CRP 2 with MD if interested. Pt stated he walked in halls last night without difficulty and no SOB. Luetta Nutting RN BSN 12/16/2020 8:59 AM

## 2020-12-16 NOTE — Plan of Care (Signed)
  Problem: Activity: Goal: Risk for activity intolerance will decrease Outcome: Adequate for Discharge   Problem: Nutrition: Goal: Adequate nutrition will be maintained Outcome: Adequate for Discharge   Problem: Elimination: Goal: Will not experience complications related to bowel motility Outcome: Adequate for Discharge   Problem: Pain Managment: Goal: General experience of comfort will improve Outcome: Adequate for Discharge   Problem: Safety: Goal: Ability to remain free from injury will improve Outcome: Adequate for Discharge   

## 2020-12-16 NOTE — Plan of Care (Signed)

## 2020-12-18 LAB — CULTURE, BLOOD (ROUTINE X 2)
Culture: NO GROWTH
Culture: NO GROWTH
Special Requests: ADEQUATE
Special Requests: ADEQUATE

## 2020-12-23 ENCOUNTER — Ambulatory Visit (HOSPITAL_COMMUNITY)
Admission: RE | Admit: 2020-12-23 | Discharge: 2020-12-23 | Disposition: A | Discharge status: Home / Self Care | Payer: BC Managed Care – PPO | Source: Ambulatory Visit | Attending: Internal Medicine | Admitting: Internal Medicine

## 2020-12-23 ENCOUNTER — Encounter (HOSPITAL_COMMUNITY): Payer: Self-pay | Admitting: Internal Medicine

## 2020-12-23 ENCOUNTER — Other Ambulatory Visit: Payer: Self-pay

## 2020-12-23 VITALS — BP 98/67 | HR 112 | Wt 196.8 lb

## 2020-12-23 DIAGNOSIS — Z8616 Personal history of COVID-19: Secondary | ICD-10-CM | POA: Insufficient documentation

## 2020-12-23 DIAGNOSIS — B3322 Viral myocarditis: Secondary | ICD-10-CM | POA: Diagnosis not present

## 2020-12-23 DIAGNOSIS — F17210 Nicotine dependence, cigarettes, uncomplicated: Secondary | ICD-10-CM | POA: Insufficient documentation

## 2020-12-23 DIAGNOSIS — Z09 Encounter for follow-up examination after completed treatment for conditions other than malignant neoplasm: Secondary | ICD-10-CM | POA: Insufficient documentation

## 2020-12-23 DIAGNOSIS — I514 Myocarditis, unspecified: Secondary | ICD-10-CM | POA: Diagnosis not present

## 2020-12-23 DIAGNOSIS — R42 Dizziness and giddiness: Secondary | ICD-10-CM | POA: Insufficient documentation

## 2020-12-23 DIAGNOSIS — Z28310 Unvaccinated for covid-19: Secondary | ICD-10-CM | POA: Diagnosis not present

## 2020-12-23 DIAGNOSIS — Z79899 Other long term (current) drug therapy: Secondary | ICD-10-CM | POA: Insufficient documentation

## 2020-12-23 DIAGNOSIS — R0602 Shortness of breath: Secondary | ICD-10-CM | POA: Diagnosis present

## 2020-12-23 DIAGNOSIS — J189 Pneumonia, unspecified organism: Secondary | ICD-10-CM | POA: Diagnosis not present

## 2020-12-23 DIAGNOSIS — I5020 Unspecified systolic (congestive) heart failure: Secondary | ICD-10-CM | POA: Diagnosis not present

## 2020-12-23 NOTE — Progress Notes (Signed)
Advanced Heart Failure Clinic Note   PCP: Pcp, No PCP-Cardiologist: None  HF Cardiologist: Dr. Gala Romney  HPI: [name] is a 30 y.o. male with no significant PMHx.    Admitted 9/22 with acute myopericarditis, likely secondary to COVID infection 1 month prior (had the virus x 3 times, unvaccinated). BP initially low and given IVF, resulting in volume status being up requiring a dose of IV lasix 120 mg. Echo with EF 45%. Started on colchicine and NSAIDs. cMRI showed LV EF 49%, normal RV, no myocardial LGE but mildly elevated T2 signal anterior wall and appears to be LGE in the pericardium adjacent to the anterior wall.  Feels more short of breath  Today was raking for about a minute before he started to get tired. Feels light headed when exerting himself No orthopnea, no edema      Cardiac Studies - Echo (9/22): EF 45% - cMRI (9/22):LV EF 49%, normal RV, no myocardial LGE but mildly elevated T2 signal anterior wall and appears to be LGE in the pericardium adjacent to the anterior wall.  Review of Systems: [y] = yes, [ ]  = no   General: Weight gain [ ] ; Weight loss [ ] ; Anorexia [ ] ; Fatigue [ ] ; Fever [ ] ; Chills [ ] ; Weakness [ ]   Cardiac: Chest pain/pressure [ ] ; Resting SOB [ ] ; Exertional SOB [ ] ; Orthopnea [ ] ; Pedal Edema [ ] ; Palpitations [ ] ; Syncope [ ] ; Presyncope [ ] ; Paroxysmal nocturnal dyspnea[ ]   Pulmonary: Cough [ ] ; Wheezing[ ] ; Hemoptysis[ ] ; Sputum [ ] ; Snoring [ ]   GI: Vomiting[ ] ; Dysphagia[ ] ; Melena[ ] ; Hematochezia [ ] ; Heartburn[ ] ; Abdominal pain [ ] ; Constipation [ ] ; Diarrhea [ ] ; BRBPR [ ]   GU: Hematuria[ ] ; Dysuria [ ] ; Nocturia[ ]   Vascular: Pain in legs with walking [ ] ; Pain in feet with lying flat [ ] ; Non-healing sores [ ] ; Stroke [ ] ; TIA [ ] ; Slurred speech [ ] ;  Neuro: Headaches[ ] ; Vertigo[ ] ; Seizures[ ] ; Paresthesias[ ] ;Blurred vision [ ] ; Diplopia [ ] ; Vision changes [ ]   Ortho/Skin: Arthritis [ ] ; Joint pain [ ] ; Muscle pain [ ] ; Joint  swelling [ ] ; Back Pain [ ] ; Rash [ ]   Psych: Depression[ ] ; Anxiety[ ]   Heme: Bleeding problems [ ] ; Clotting disorders [ ] ; Anemia [ ]   Endocrine: Diabetes [ ] ; Thyroid dysfunction[ ]    No past medical history on file.  Current Outpatient Medications  Medication Sig Dispense Refill   bisoprolol (ZEBETA) 5 MG tablet Take 0.5 tablets (2.5 mg total) by mouth daily. 15 tablet 0   colchicine 0.6 MG tablet Take 1 tablet (0.6 mg total) by mouth 2 (two) times daily. 60 tablet 0   losartan (COZAAR) 25 MG tablet Take 1 tablet (25 mg total) by mouth at bedtime. 30 tablet 0   pantoprazole (PROTONIX) 40 MG tablet Take 1 tablet (40 mg total) by mouth 2 (two) times daily before a meal. 60 tablet 0   spironolactone (ALDACTONE) 25 MG tablet Take 0.5 tablets (12.5 mg total) by mouth daily. 15 tablet 0   No current facility-administered medications for this visit.   Allergies  Allergen Reactions   Benzonatate Swelling    Throat swelling   Social History   Socioeconomic History   Marital status: Unknown    Spouse name: Not on file   Number of children: Not on file   Years of education: Not on file   Highest education level: Not on file  Occupational  History   Not on file  Tobacco Use   Smoking status: Some Days    Types: Cigarettes   Smokeless tobacco: Never  Substance and Sexual Activity   Alcohol use: Yes    Comment: occassional   Drug use: Never   Sexual activity: Not on file  Other Topics Concern   Not on file  Social History Narrative   Not on file   Social Determinants of Health   Financial Resource Strain: Not on file  Food Insecurity: Not on file  Transportation Needs: Not on file  Physical Activity: Not on file  Stress: Not on file  Social Connections: Not on file  Intimate Partner Violence: Not on file    No family history on file.  There were no vitals filed for this visit.  PHYSICAL EXAM: General:  Well appearing. No respiratory difficulty HEENT: normal Neck:  supple. no JVD. Carotids 2+ bilat; no bruits. No lymphadenopathy or thyromegaly appreciated. Cor: PMI nondisplaced. Regular rate & rhythm. No rubs, gallops or murmurs. Lungs: clear Abdomen: soft, nontender, nondistended. No hepatosplenomegaly. No bruits or masses. Good bowel sounds. Extremities: no cyanosis, clubbing, rash, edema Neuro: alert & oriented x 3, cranial nerves grossly intact. moves all 4 extremities w/o difficulty. Affect pleasant.  ECG:  ASSESSMENT & PLAN: 1. Suspect myopericarditis:  - Cardiac MRI with LV EF 49%, normal RV, no myocardial LGE but mildly elevated T2 signal anterior wall and appears to be LGE in the pericardium adjacent to the anterior wall.  ? if this is related to COVID-19 infection that he had about a month ago. Pleuritic chest pain resolving.   - Continue colchicine 0.6 bid.  - Continue ASA 650 q8 hrs.  - Continue PPI  - Activity limitation x 6 months, discussed.  - Coronary CT no CAD. + PNA   - Continue bisoprolol 2.5 mg daily. - Continue spiro 25 mg daily. - Continue losartan 25 mg at night   2. PNA - On abx.   Follow up in    Cora Collum, DO 12/23/20   Patient seen and examined with the above-signed Advanced Practice Provider and/or Housestaff. I personally reviewed laboratory data, imaging studies and relevant notes. I independently examined the patient and formulated the important aspects of the plan. I have edited the note to reflect any of my changes or salient points. I have personally discussed the plan with the patient and/or family.   30 y/o male with recent admission for myocarditis. EF 49%  Feeling some better but still with episodes of chest tightness and fatigue. No edema, orthopnea or PND. Very compliant with meds. No palpitations. BP soft. Rare dizziness.  General:  Well appearing. No resp difficulty HEENT: normal Neck: supple. no JVD. Carotids 2+ bilat; no bruits. No lymphadenopathy or thryomegaly appreciated. Cor: PMI  nondisplaced. Regular rate & rhythm. No rubs, gallops or murmurs. Lungs: clear Abdomen: soft, nontender, nondistended. No hepatosplenomegaly. No bruits or masses. Good bowel sounds. Extremities: no cyanosis, clubbing, rash, edema Neuro: alert & orientedx3, cranial nerves grossly intact. moves all 4 extremities w/o difficulty. Affect pleasant  He continues to recover from myocarditis. Stressed need to be careful with activities for at least 3 months. Avoid high-end exertion. No competitive sports or isometrics. Continue current meds.   F/u with echo in 2 months.   Arvilla Meres, MD  10:46 PM

## 2020-12-25 ENCOUNTER — Encounter (HOSPITAL_COMMUNITY): Payer: Self-pay

## 2020-12-26 ENCOUNTER — Other Ambulatory Visit (HOSPITAL_COMMUNITY): Payer: Self-pay | Admitting: Internal Medicine

## 2020-12-26 ENCOUNTER — Other Ambulatory Visit: Payer: Self-pay

## 2020-12-26 ENCOUNTER — Ambulatory Visit (HOSPITAL_COMMUNITY)
Admission: RE | Admit: 2020-12-26 | Discharge: 2020-12-26 | Disposition: A | Discharge status: Home / Self Care | Payer: BC Managed Care – PPO | Source: Ambulatory Visit | Attending: Internal Medicine | Admitting: Internal Medicine

## 2020-12-26 DIAGNOSIS — I493 Ventricular premature depolarization: Secondary | ICD-10-CM

## 2021-01-13 ENCOUNTER — Other Ambulatory Visit (HOSPITAL_COMMUNITY): Payer: Self-pay | Admitting: *Deleted

## 2021-01-13 MED ORDER — PANTOPRAZOLE SODIUM 40 MG PO TBEC: 40.0000 mg | DELAYED_RELEASE_TABLET | Freq: Two times a day (BID) | ORAL | 3 refills | Status: DC

## 2021-01-13 MED ORDER — SPIRONOLACTONE 25 MG PO TABS: 12.5000 mg | ORAL_TABLET | Freq: Every day | ORAL | 3 refills | Status: DC

## 2021-01-13 MED ORDER — COLCHICINE 0.6 MG PO TABS: 0.6000 mg | ORAL_TABLET | Freq: Two times a day (BID) | ORAL | 3 refills | Status: DC

## 2021-01-13 MED ORDER — LOSARTAN POTASSIUM 25 MG PO TABS: 25.0000 mg | ORAL_TABLET | Freq: Every day | ORAL | 3 refills | Status: DC

## 2021-01-13 MED ORDER — BISOPROLOL FUMARATE 5 MG PO TABS: 2.5000 mg | ORAL_TABLET | Freq: Every day | ORAL | 3 refills | Status: DC

## 2021-02-26 ENCOUNTER — Encounter (HOSPITAL_COMMUNITY): Payer: Self-pay | Admitting: Internal Medicine

## 2021-02-26 ENCOUNTER — Ambulatory Visit (HOSPITAL_COMMUNITY)
Admission: RE | Admit: 2021-02-26 | Discharge: 2021-02-26 | Disposition: A | Discharge status: Home / Self Care | Payer: BC Managed Care – PPO | Source: Ambulatory Visit | Attending: Internal Medicine | Admitting: Internal Medicine

## 2021-02-26 ENCOUNTER — Other Ambulatory Visit: Payer: Self-pay

## 2021-02-26 ENCOUNTER — Ambulatory Visit (HOSPITAL_BASED_OUTPATIENT_CLINIC_OR_DEPARTMENT_OTHER)
Admission: RE | Admit: 2021-02-26 | Discharge: 2021-02-26 | Disposition: A | Discharge status: Home / Self Care | Payer: BC Managed Care – PPO | Source: Ambulatory Visit | Attending: Internal Medicine | Admitting: Internal Medicine

## 2021-02-26 VITALS — BP 104/68 | HR 69 | Wt 198.4 lb

## 2021-02-26 DIAGNOSIS — B3322 Viral myocarditis: Secondary | ICD-10-CM | POA: Diagnosis not present

## 2021-02-26 DIAGNOSIS — I5020 Unspecified systolic (congestive) heart failure: Secondary | ICD-10-CM | POA: Diagnosis not present

## 2021-02-26 LAB — ECHOCARDIOGRAM COMPLETE
Area-P 1/2: 2.91 cm2
S' Lateral: 2.9 cm

## 2021-02-26 NOTE — Progress Notes (Signed)
Advanced Heart Failure Clinic Note   PCP: Pcp, No PCP-Cardiologist: None  HF Cardiologist: Dr. Gala Romney  HPI: Dale Macias is a 30 y.o. male with no significant PMHx.    Admitted 9/22 with acute myopericarditis, likely secondary to COVID infection 1 month prior (had the virus x 3 times, unvaccinated). BP initially low and given IVF, resulting in volume status being up requiring a dose of IV lasix 120 mg. Echo with EF 45%. Started on colchicine and NSAIDs. cMRI showed LV EF 49%, normal RV, no myocardial LGE but mildly elevated T2 signal anterior wall and appears to be LGE in the pericardium adjacent to the anterior wall.  He returns for f/u. Doing well. Working in [employer]. Fishing and hunting without problem. Occasional pulling sensation in his upper chest and left shoulder. Was not taking meds for about a month but last 4 days he has been back on. No HF symptoms, palpitations or presyncope.    Echo today 02/26/21 EF 55-60% Personally reviewed   Cardiac Studies - Echo (9/22): EF 45% - cMRI (9/22):LV EF 49%, normal RV, no myocardial LGE but mildly elevated T2 signal anterior wall and appears to be LGE in the pericardium adjacent to the anterior wall.   No past medical history on file.  Current Outpatient Medications  Medication Sig Dispense Refill   bisoprolol (ZEBETA) 5 MG tablet Take 0.5 tablets (2.5 mg total) by mouth daily. 15 tablet 3   colchicine 0.6 MG tablet Take 1 tablet (0.6 mg total) by mouth 2 (two) times daily. 60 tablet 3   losartan (COZAAR) 25 MG tablet Take 1 tablet (25 mg total) by mouth at bedtime. 30 tablet 3   pantoprazole (PROTONIX) 40 MG tablet Take 1 tablet (40 mg total) by mouth 2 (two) times daily before a meal. 60 tablet 3   spironolactone (ALDACTONE) 25 MG tablet Take 0.5 tablets (12.5 mg total) by mouth daily. 15 tablet 3   No current facility-administered medications for this encounter.   Allergies  Allergen Reactions   Benzonatate Swelling     Throat swelling   Social History   Socioeconomic History   Marital status: Unknown    Spouse name: Not on file   Number of children: Not on file   Years of education: Not on file   Highest education level: Not on file  Occupational History   Not on file  Tobacco Use   Smoking status: Some Days    Types: Cigarettes   Smokeless tobacco: Never  Substance and Sexual Activity   Alcohol use: Yes    Comment: occassional   Drug use: Never   Sexual activity: Not on file  Other Topics Concern   Not on file  Social History Narrative   Not on file   Social Determinants of Health   Financial Resource Strain: Not on file  Food Insecurity: Not on file  Transportation Needs: Not on file  Physical Activity: Not on file  Stress: Not on file  Social Connections: Not on file  Intimate Partner Violence: Not on file    No family history on file.  Vitals:   02/26/21 1445  BP: 104/68  Pulse: 69  SpO2: 97%  Weight: 90 kg (198 lb 6.4 oz)    PHYSICAL EXAM: General:  Well appearing. No resp difficulty HEENT: normal Neck: supple. no JVD. Carotids 2+ bilat; no bruits. No lymphadenopathy or thryomegaly appreciated. Cor: PMI nondisplaced. Regular rate & rhythm. No rubs, gallops or murmurs. Lungs: clear Abdomen: soft, nontender, nondistended.  No hepatosplenomegaly. No bruits or masses. Good bowel sounds. Gash from accident under left pectoral Extremities: no cyanosis, clubbing, rash, edema Neuro: alert & orientedx3, cranial nerves grossly intact. moves all 4 extremities w/o difficulty. Affect pleasant   ECG: NSR 70 early repolarization. Personally reviewed    ASSESSMENT & PLAN:  1. Chest pain with elevated troponin due to myopericarditis:  - Cardiac MRI with LV EF 49%, normal RV, no myocardial LGE but mildly elevated T2 signal anterior wall and appears to be LGE in the pericardium adjacent to the anterior wall.  - Coronary CT no CAD. + PNA   - Has been off meds x 1 month - No  further CP - Echo today 02/26/21 EF 55-60%  - Resolved - As he has been off meds for > 1 month and no further symptoms and EF normal will not restart meds at this point. (We discussed pros/cons of restarting meds) - F/u as needed   Dale Meres, MD 02/26/21

## 2021-02-26 NOTE — Patient Instructions (Signed)
Congratulations you have graduated from the Heart Failure clinic.  Your physician recommends that you schedule a follow-up appointment in: as needed   If you have any questions or concerns before your next appointment please send [place] a message through [place] or call our office at [phone].    TO LEAVE A MESSAGE FOR THE NURSE SELECT OPTION 2, PLEASE LEAVE A MESSAGE INCLUDING: YOUR NAME DATE OF BIRTH CALL BACK NUMBER REASON FOR CALL**this is important as we prioritize the call backs  YOU WILL RECEIVE A CALL BACK THE SAME DAY AS LONG AS YOU CALL BEFORE 4:00 PM  At the [facility], you and your health needs are our priority. As part of our continuing mission to provide you with exceptional heart care, we have created designated Provider Care Teams. These Care Teams include your primary Cardiologist (physician) and Advanced Practice Providers (APPs- Physician Assistants and Nurse Practitioners) who all work together to provide you with the care you need, when you need it.   You may see any of the following providers on your designated Care Team at your next follow up: Dr Arvilla Meres Dr Carron Curie, NP Robbie Lis, [place] [facility] [place], [place] Karle Plumber, PharmD   Please be sure to bring in all your medications bottles to every appointment.

## 2021-06-12 ENCOUNTER — Encounter (HOSPITAL_COMMUNITY): Payer: Self-pay

## 2021-06-12 ENCOUNTER — Ambulatory Visit (HOSPITAL_COMMUNITY)
Admission: RE | Admit: 2021-06-12 | Discharge: 2021-06-12 | Disposition: A | Discharge status: Home / Self Care | Payer: BC Managed Care – PPO | Source: Ambulatory Visit | Attending: Family Medicine | Admitting: Family Medicine

## 2021-06-12 ENCOUNTER — Other Ambulatory Visit: Payer: Self-pay

## 2021-06-12 VITALS — BP 112/78 | HR 76 | Wt 196.6 lb

## 2021-06-12 DIAGNOSIS — R5383 Other fatigue: Secondary | ICD-10-CM | POA: Insufficient documentation

## 2021-06-12 DIAGNOSIS — F1090 Alcohol use, unspecified, uncomplicated: Secondary | ICD-10-CM | POA: Diagnosis not present

## 2021-06-12 DIAGNOSIS — F17210 Nicotine dependence, cigarettes, uncomplicated: Secondary | ICD-10-CM | POA: Insufficient documentation

## 2021-06-12 DIAGNOSIS — I10 Essential (primary) hypertension: Secondary | ICD-10-CM

## 2021-06-12 DIAGNOSIS — Z8679 Personal history of other diseases of the circulatory system: Secondary | ICD-10-CM | POA: Insufficient documentation

## 2021-06-12 DIAGNOSIS — F419 Anxiety disorder, unspecified: Secondary | ICD-10-CM | POA: Insufficient documentation

## 2021-06-12 DIAGNOSIS — I514 Myocarditis, unspecified: Secondary | ICD-10-CM | POA: Diagnosis not present

## 2021-06-12 DIAGNOSIS — I509 Heart failure, unspecified: Secondary | ICD-10-CM | POA: Diagnosis not present

## 2021-06-12 DIAGNOSIS — I110 Hypertensive heart disease with heart failure: Secondary | ICD-10-CM | POA: Insufficient documentation

## 2021-06-12 DIAGNOSIS — G479 Sleep disorder, unspecified: Secondary | ICD-10-CM | POA: Insufficient documentation

## 2021-06-12 DIAGNOSIS — Z8701 Personal history of pneumonia (recurrent): Secondary | ICD-10-CM | POA: Insufficient documentation

## 2021-06-12 DIAGNOSIS — F1010 Alcohol abuse, uncomplicated: Secondary | ICD-10-CM

## 2021-06-12 DIAGNOSIS — Z8616 Personal history of COVID-19: Secondary | ICD-10-CM | POA: Diagnosis not present

## 2021-06-12 DIAGNOSIS — Z720 Tobacco use: Secondary | ICD-10-CM

## 2021-06-12 DIAGNOSIS — I498 Other specified cardiac arrhythmias: Secondary | ICD-10-CM | POA: Insufficient documentation

## 2021-06-12 DIAGNOSIS — R9431 Abnormal electrocardiogram [ECG] [EKG]: Secondary | ICD-10-CM | POA: Insufficient documentation

## 2021-06-12 LAB — COMPREHENSIVE METABOLIC PANEL
ALT: 31 U/L (ref 0–44)
AST: 31 U/L (ref 15–41)
Albumin: 4.5 g/dL (ref 3.5–5.0)
Alkaline Phosphatase: 45 U/L (ref 38–126)
Anion gap: 11 (ref 5–15)
BUN: 13 mg/dL (ref 6–20)
CO2: 28 mmol/L (ref 22–32)
Calcium: 9.5 mg/dL (ref 8.9–10.3)
Chloride: 100 mmol/L (ref 98–111)
Creatinine, Ser: 1.16 mg/dL (ref 0.61–1.24)
GFR, Estimated: >60 mL/min (ref >60)
Glucose, Bld: 103 mg/dL — ABNORMAL HIGH (ref 70–99)
Potassium: 4.3 mmol/L (ref 3.5–5.1)
Sodium: 139 mmol/L (ref 135–145)
Total Bilirubin: 0.7 mg/dL (ref 0.3–1.2)
Total Protein: 7.6 g/dL (ref 6.5–8.1)

## 2021-06-12 LAB — CBC
HCT: 45.7 % (ref 39.0–52.0)
Hemoglobin: 15.4 g/dL (ref 13.0–17.0)
MCH: 28.9 pg (ref 26.0–34.0)
MCHC: 33.7 g/dL (ref 30.0–36.0)
MCV: 85.9 fL (ref 80.0–100.0)
Platelets: 229 10*3/uL (ref 150–400)
RBC: 5.32 MIL/uL (ref 4.22–5.81)
RDW: 12.3 % (ref 11.5–15.5)
WBC: 7.4 10*3/uL (ref 4.0–10.5)
nRBC: 0 % (ref 0.0–0.2)

## 2021-06-12 LAB — TSH: TSH: 0.969 u[IU]/mL (ref 0.350–4.500)

## 2021-06-12 LAB — MAGNESIUM: Magnesium: 2.1 mg/dL (ref 1.7–2.4)

## 2021-06-12 NOTE — Progress Notes (Signed)
ReDS Vest / Clip - 06/12/21 0800   ? ?  ? ReDS Vest / Clip  ? Station Marker C   ? Ruler Value 31   ? ReDS Value Range Low volume   ? ReDS Actual Value 3   ? Anatomical Comments 32   ? ?  ?  ? ?  ? ? ?

## 2021-06-12 NOTE — Patient Instructions (Signed)
Labs done today, your results will be available in MyChart, we will contact you for abnormal readings. ? ?Your physician has requested that you have an echocardiogram. Echocardiography is a painless test that uses sound waves to create images of your heart. It provides your doctor with information about the size and shape of your heart and how well your heart?s chambers and valves are working. This procedure takes approximately one hour. There are no restrictions for this procedure. ? ?Your physician recommends that you schedule a follow-up appointment in: 6 weeks ? ?If you have any questions or concerns before your next appointment please send [place] a message through [place] or call our office at [phone].   ? ?TO LEAVE A MESSAGE FOR THE NURSE SELECT OPTION 2, PLEASE LEAVE A MESSAGE INCLUDING: ?YOUR NAME ?DATE OF BIRTH ?CALL BACK NUMBER ?REASON FOR CALL**this is important as we prioritize the call backs ? ?YOU WILL RECEIVE A CALL BACK THE SAME DAY AS LONG AS YOU CALL BEFORE 4:00 PM ? ? ?

## 2021-06-12 NOTE — Progress Notes (Signed)
?Advanced [facility] Failure Clinic Note  ? ?PCP: Pcp, No ?HF Cardiologist: Dr. Gala Romney ? ?HPI: ?[name] is a 31 y.o. male with no significant PMHx.  ?  ?Admitted 9/22 with acute myopericarditis, likely secondary to COVID infection 1 month prior (had the virus x 3 times, unvaccinated). BP initially low and given IVF, resulting in volume status being up requiring a dose of IV lasix 120 mg. Echo with EF 45%. Started on colchicine and NSAIDs. cMRI showed LV EF 49%, normal RV, no myocardial LGE but mildly elevated T2 signal anterior wall and appears to be LGE in the pericardium adjacent to the anterior wall. ? ?Follow up 12/22 had been off meds x 1 month, but doing well with no symptoms so decision made to not restart meds and could follow up as needed. ? ?Echo 12/22 EF 55-60% ? ?Today he returns for an acute visit with complaints of worsening fatigue and decreased exercise endurance . Has noticed walking up hill has caused more SOB, feels out of shape. "I've been feeling weird." Intermittent squeezing sensation to left chest and left armpit, "like someone squeezing my muscle." Feels more fatigued and less motivated. BP log at home 120-144/70-80s, HR 70-80s. Drinking 4-12 oz liquor/nightly.Denies palpitations, dizziness, edema, or PND/Orthopnea. Appetite ok. No fever or chills. Works Program[employer], [employer], smokes cigarette occasionally. Drinks 1/2 pot of coffee/day. Sleeps poorly. ? ?Cardiac Studies ?- Echo (12/22): EF 55-60% ? ?- Echo (9/22): EF 45% ? ?- cMRI (9/22):LV EF 49%, normal RV, no myocardial LGE but mildly elevated T2 signal anterior wall and appears to be LGE in the pericardium adjacent to the anterior wall. ? ?No past medical history on file. ? ?No current outpatient medications on file.  ? ?No current facility-administered medications for this encounter.  ? ?Allergies  ?Allergen Reactions  ? Benzonatate Swelling  ?  Throat swelling  ? ?Social History  ? ?Socioeconomic History  ? Marital status: Unknown  ?   Spouse name: Not on file  ? Number of children: Not on file  ? Years of education: Not on file  ? Highest education level: Not on file  ?Occupational History  ? Not on file  ?Tobacco Use  ? Smoking status: Some Days  ?  Types: Cigarettes  ? Smokeless tobacco: Never  ?Substance and Sexual Activity  ? Alcohol use: Yes  ?  Comment: occassional  ? Drug use: Never  ? Sexual activity: Not on file  ?Other Topics Concern  ? Not on file  ?Social History Narrative  ? Not on file  ? ?Social Determinants of Health  ? ?Financial Resource Strain: Not on file  ?Food Insecurity: Not on file  ?Transportation Needs: Not on file  ?Physical Activity: Not on file  ?Stress: Not on file  ?Social Connections: Not on file  ?Intimate Partner Violence: Not on file  ? ? No family history on file. ? ?BP 112/78   Pulse 76   Wt 89.2 kg (196 lb 9.6 oz)   SpO2 97%   BMI 27.42 kg/m?  ? ?Wt Readings from Last 3 Encounters:  ?06/12/21 89.2 kg (196 lb 9.6 oz)  ?02/26/21 90 kg (198 lb 6.4 oz)  ?12/23/20 89.3 kg (196 lb 12.8 oz)  ? ?PHYSICAL EXAM: ?General:  NAD. No resp difficulty ?HEENT: Normal ?Neck: Supple. No JVD. Carotids 2+ bilat; no bruits. No lymphadenopathy or thryomegaly appreciated. ?Cor: PMI nondisplaced. Regular rate & rhythm. No rubs, gallops or murmurs. ?Lungs: Clear ?Abdomen: Soft, nontender, nondistended. No hepatosplenomegaly. No bruits or masses.  Good bowel sounds. ?Extremities: No cyanosis, clubbing, rash, edema ?Neuro: Alert & oriented x 3, cranial nerves grossly intact. Moves all 4 extremities w/o difficulty. Affect pleasant. ? ?ECG: NSR 63 bpm (personally reviewed). ? ?REDs: 32% ? ?ASSESSMENT & PLAN: ?Fatigue: ?- Likely multifactorial. Increased ETOH + poor sleep, +/- anxiety from prior cardiac diagnosis. He does not snore. ?- Needs to stop ETOH, suspect this is interfering with restful sleep. ?- Check CMET, CBC, TSH. ?- Reasonable to repeat echo, will arrange. ? ?2. H/o myopericarditis:  ?- h/o elevated trop 2/2  myopericarditis 9/22. ?- Cardiac MRI with LV EF 49%, normal RV, no myocardial LGE but mildly elevated T2 signal anterior wall and appears to be LGE in the pericardium adjacent to the anterior wall.  ?- Coronary CT no CAD. + PNA   ?- Echo (12/22): 55-60% ?- Been off HF meds since 12/22. ?- Occasional chest squeeze, not associated with activity. ?- ECG looks ok. ?- Repeat echo as above. ? ?3. ETOH use ?- Discussed cessation. ?- Check mag level. ? ?4. HTN ?- Mildly elevated on home readings. ?- Abstain from ETOH. ?- Continue to check BP at home. ?- Discussed starting low-dose losartan, he would like to try lifestyle changes first. ? ?5. Tobacco Use ?- Advised cessation. ? ?Follow up with APP in 6 weeks. ? ?Jacklynn Ganong, FNP ?06/12/21  ?

## 2021-06-12 NOTE — Addendum Note (Signed)
Encounter addended by: Scarlette Calico, RN on: 06/12/2021 1:46 PM ? Actions taken: Charge Capture section accepted

## 2021-06-22 ENCOUNTER — Ambulatory Visit (HOSPITAL_COMMUNITY)
Admission: RE | Admit: 2021-06-22 | Discharge: 2021-06-22 | Disposition: A | Discharge status: Home / Self Care | Payer: BC Managed Care – PPO | Source: Ambulatory Visit | Attending: Family Medicine | Admitting: Family Medicine

## 2021-06-22 ENCOUNTER — Other Ambulatory Visit: Payer: Self-pay

## 2021-06-22 DIAGNOSIS — R5383 Other fatigue: Secondary | ICD-10-CM

## 2021-06-22 DIAGNOSIS — I514 Myocarditis, unspecified: Secondary | ICD-10-CM | POA: Diagnosis not present

## 2021-06-22 DIAGNOSIS — I358 Other nonrheumatic aortic valve disorders: Secondary | ICD-10-CM | POA: Insufficient documentation

## 2021-06-22 LAB — ECHOCARDIOGRAM COMPLETE
AR max vel: 3.13 cm2
AV Area VTI: 2.89 cm2
AV Area mean vel: 2.89 cm2
AV Mean grad: 2 mmHg
AV Peak grad: 3.1 mmHg
Ao pk vel: 0.88 m/s
Area-P 1/2: 4.01 cm2
S' Lateral: 3.2 cm

## 2021-06-22 NOTE — Progress Notes (Signed)
?  Echocardiogram ?2D Echocardiogram has been performed. ? ?Dale Macias ?06/22/2021, 1:37 PM ?

## 2021-07-20 ENCOUNTER — Other Ambulatory Visit: Payer: Self-pay

## 2021-07-20 ENCOUNTER — Encounter (HOSPITAL_BASED_OUTPATIENT_CLINIC_OR_DEPARTMENT_OTHER): Payer: Self-pay | Admitting: Emergency Medicine

## 2021-07-20 ENCOUNTER — Emergency Department (HOSPITAL_BASED_OUTPATIENT_CLINIC_OR_DEPARTMENT_OTHER): Payer: BC Managed Care – PPO

## 2021-07-20 ENCOUNTER — Inpatient Hospital Stay (HOSPITAL_BASED_OUTPATIENT_CLINIC_OR_DEPARTMENT_OTHER)
Admission: EM | Admit: 2021-07-20 | Discharge: 2021-07-25 | DRG: 314 | Disposition: A | Discharge status: Home / Self Care | Payer: BC Managed Care – PPO | Attending: Student | Admitting: Student

## 2021-07-20 DIAGNOSIS — F17210 Nicotine dependence, cigarettes, uncomplicated: Secondary | ICD-10-CM | POA: Diagnosis present

## 2021-07-20 DIAGNOSIS — G9600 Cerebrospinal fluid leak, unspecified: Secondary | ICD-10-CM | POA: Diagnosis not present

## 2021-07-20 DIAGNOSIS — I5022 Chronic systolic (congestive) heart failure: Secondary | ICD-10-CM

## 2021-07-20 DIAGNOSIS — M436 Torticollis: Secondary | ICD-10-CM | POA: Diagnosis not present

## 2021-07-20 DIAGNOSIS — I319 Disease of pericardium, unspecified: Secondary | ICD-10-CM | POA: Diagnosis present

## 2021-07-20 DIAGNOSIS — I301 Infective pericarditis: Principal | ICD-10-CM | POA: Diagnosis present

## 2021-07-20 DIAGNOSIS — R7989 Other specified abnormal findings of blood chemistry: Secondary | ICD-10-CM

## 2021-07-20 DIAGNOSIS — Y844 Aspiration of fluid as the cause of abnormal reaction of the patient, or of later complication, without mention of misadventure at the time of the procedure: Secondary | ICD-10-CM | POA: Diagnosis not present

## 2021-07-20 DIAGNOSIS — R519 Headache, unspecified: Secondary | ICD-10-CM | POA: Insufficient documentation

## 2021-07-20 DIAGNOSIS — F1010 Alcohol abuse, uncomplicated: Secondary | ICD-10-CM

## 2021-07-20 DIAGNOSIS — G971 Other reaction to spinal and lumbar puncture: Secondary | ICD-10-CM

## 2021-07-20 DIAGNOSIS — M549 Dorsalgia, unspecified: Secondary | ICD-10-CM

## 2021-07-20 DIAGNOSIS — I5023 Acute on chronic systolic (congestive) heart failure: Secondary | ICD-10-CM | POA: Insufficient documentation

## 2021-07-20 DIAGNOSIS — R509 Fever, unspecified: Secondary | ICD-10-CM | POA: Diagnosis present

## 2021-07-20 DIAGNOSIS — B9789 Other viral agents as the cause of diseases classified elsewhere: Secondary | ICD-10-CM | POA: Diagnosis present

## 2021-07-20 DIAGNOSIS — Y92239 Unspecified place in hospital as the place of occurrence of the external cause: Secondary | ICD-10-CM | POA: Diagnosis not present

## 2021-07-20 DIAGNOSIS — R778 Other specified abnormalities of plasma proteins: Secondary | ICD-10-CM

## 2021-07-20 DIAGNOSIS — R6510 Systemic inflammatory response syndrome (SIRS) of non-infectious origin without acute organ dysfunction: Secondary | ICD-10-CM | POA: Diagnosis present

## 2021-07-20 DIAGNOSIS — I514 Myocarditis, unspecified: Secondary | ICD-10-CM | POA: Diagnosis present

## 2021-07-20 DIAGNOSIS — I3139 Other pericardial effusion (noninflammatory): Secondary | ICD-10-CM | POA: Diagnosis present

## 2021-07-20 DIAGNOSIS — Z20822 Contact with and (suspected) exposure to covid-19: Secondary | ICD-10-CM | POA: Diagnosis present

## 2021-07-20 DIAGNOSIS — Z888 Allergy status to other drugs, medicaments and biological substances status: Secondary | ICD-10-CM

## 2021-07-20 LAB — PROTEIN AND GLUCOSE, CSF
Glucose, CSF: 66 mg/dL (ref 40–70)
Total  Protein, CSF: 33 mg/dL (ref 15–45)

## 2021-07-20 LAB — CBC WITH DIFFERENTIAL/PLATELET
Abs Immature Granulocytes: 0.01 10*3/uL (ref 0.00–0.07)
Basophils Absolute: 0 10*3/uL (ref 0.0–0.1)
Basophils Relative: 0 %
Eosinophils Absolute: 0 10*3/uL (ref 0.0–0.5)
Eosinophils Relative: 0 %
HCT: 45.2 % (ref 39.0–52.0)
Hemoglobin: 15.1 g/dL (ref 13.0–17.0)
Immature Granulocytes: 0 %
Lymphocytes Relative: 14 %
Lymphs Abs: 0.8 10*3/uL (ref 0.7–4.0)
MCH: 28.9 pg (ref 26.0–34.0)
MCHC: 33.4 g/dL (ref 30.0–36.0)
MCV: 86.6 fL (ref 80.0–100.0)
Monocytes Absolute: 1 10*3/uL (ref 0.1–1.0)
Monocytes Relative: 18 %
Neutro Abs: 3.8 10*3/uL (ref 1.7–7.7)
Neutrophils Relative %: 68 %
Platelets: 143 10*3/uL — ABNORMAL LOW (ref 150–400)
RBC: 5.22 MIL/uL (ref 4.22–5.81)
RDW: 12.8 % (ref 11.5–15.5)
WBC: 5.6 10*3/uL (ref 4.0–10.5)
nRBC: 0 % (ref 0.0–0.2)

## 2021-07-20 LAB — TROPONIN I (HIGH SENSITIVITY)
Troponin I (High Sensitivity): 91 ng/L — ABNORMAL HIGH (ref <18)
Troponin I (High Sensitivity): 244 ng/L (ref <18)

## 2021-07-20 LAB — COMPREHENSIVE METABOLIC PANEL
ALT: 35 U/L (ref 0–44)
AST: 37 U/L (ref 15–41)
Albumin: 4.1 g/dL (ref 3.5–5.0)
Alkaline Phosphatase: 49 U/L (ref 38–126)
Anion gap: 6 (ref 5–15)
BUN: 16 mg/dL (ref 6–20)
CO2: 29 mmol/L (ref 22–32)
Calcium: 9.1 mg/dL (ref 8.9–10.3)
Chloride: 103 mmol/L (ref 98–111)
Creatinine, Ser: 1.12 mg/dL (ref 0.61–1.24)
GFR, Estimated: >60 mL/min (ref >60)
Glucose, Bld: 98 mg/dL (ref 70–99)
Potassium: 3.8 mmol/L (ref 3.5–5.1)
Sodium: 138 mmol/L (ref 135–145)
Total Bilirubin: 0.4 mg/dL (ref 0.3–1.2)
Total Protein: 7.3 g/dL (ref 6.5–8.1)

## 2021-07-20 LAB — RESP PANEL BY RT-PCR (FLU A&B, COVID) ARPGX2
Influenza A by PCR: NEGATIVE
Influenza B by PCR: NEGATIVE
SARS Coronavirus 2 by RT PCR: NEGATIVE

## 2021-07-20 IMAGING — CT CT HEAD W/O CM
3 series · 14 of 47 positions shown, 16 images · non-contrast
Comparison: None.

CLINICAL DATA: Meningitis/CNS infection suspected. Stiff neck,
chills, headache



[Series 2: head wo · axial · 0.51mm/px · z∈[+1316,+1456]mm · 8 of 34 slices shown, 10 images]
[im 3/34  brain]
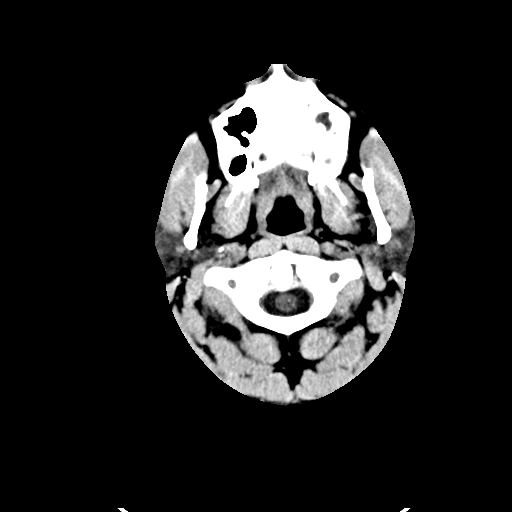
[im 3/34  bone]
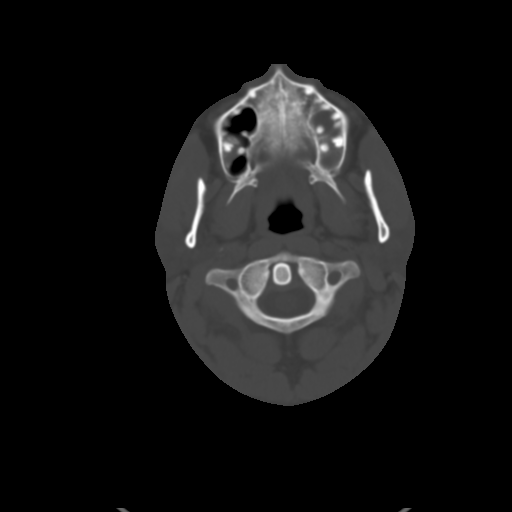
[im 7/34  brain]
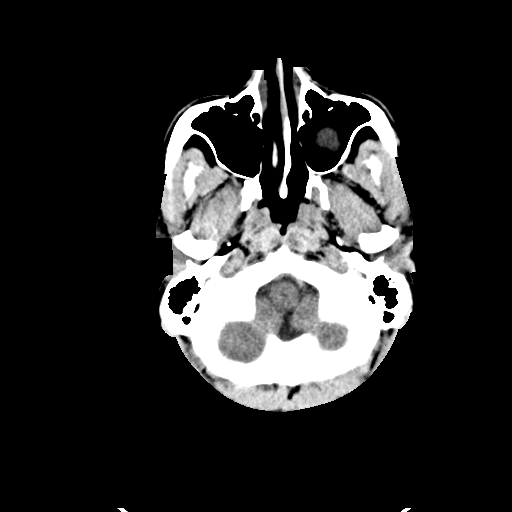
[im 11/34  brain]
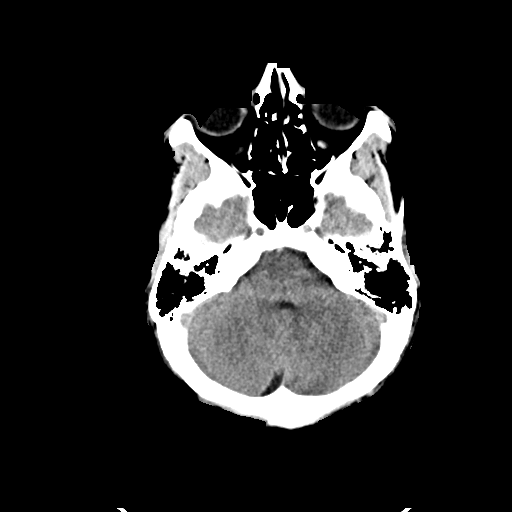
[im 15/34  brain]
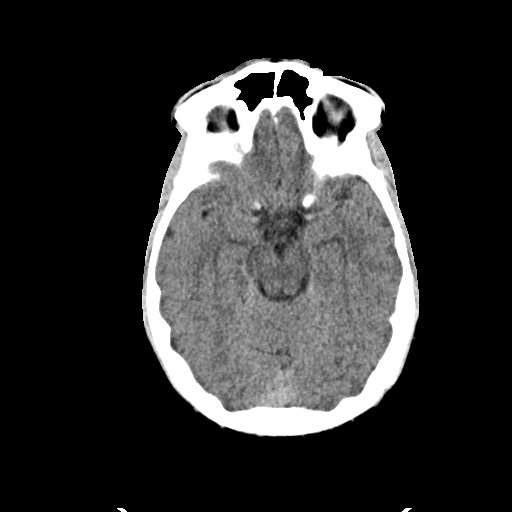
[im 19/34  brain]
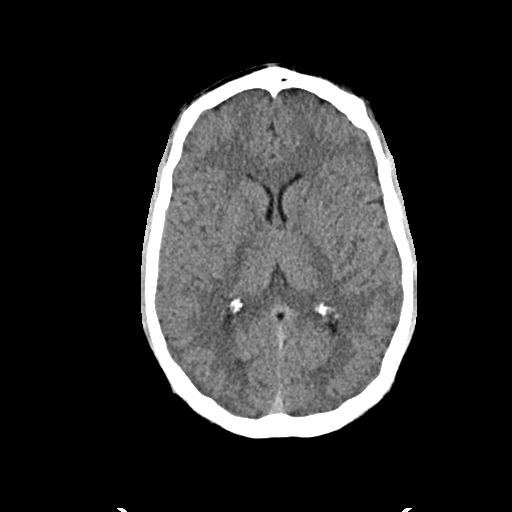
[im 19/34  bone]
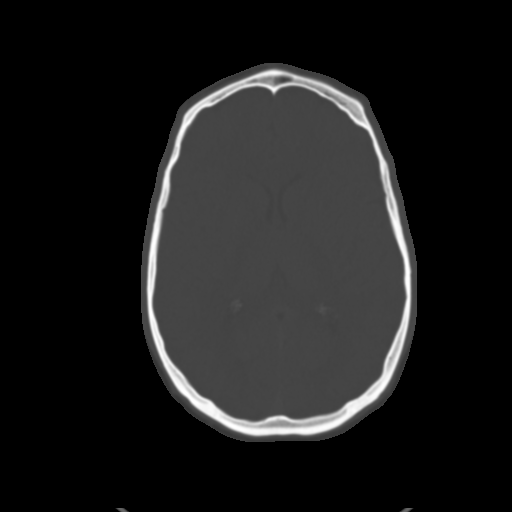
[im 23/34  brain]
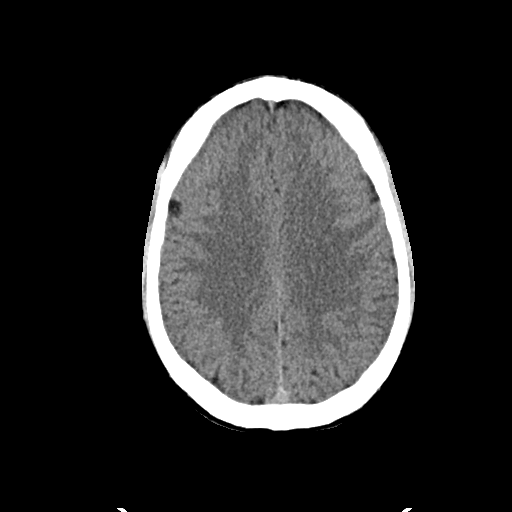
[im 27/34  brain]
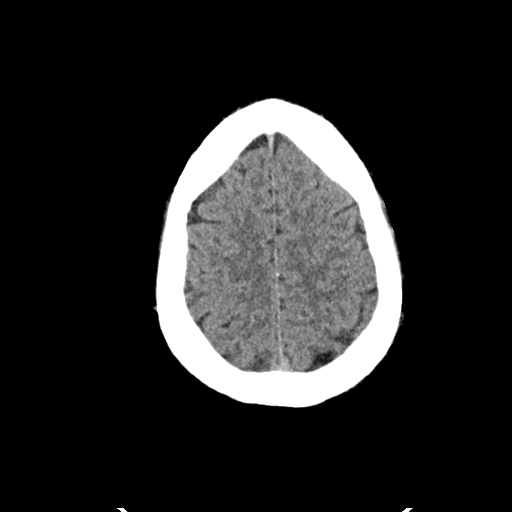
[im 31/34  brain]
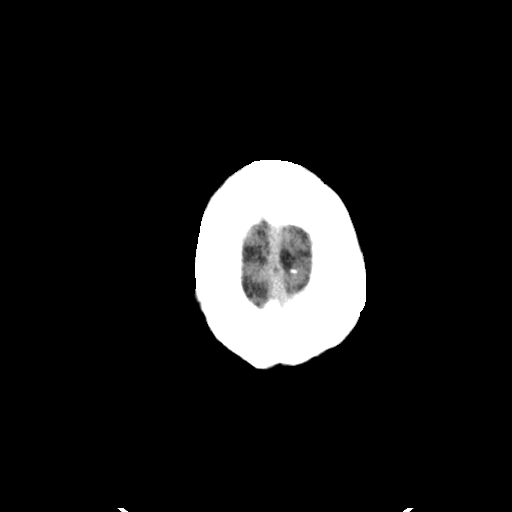

[Series 4: coronal soft · coronal · 0.33mm/px · 3 of 71 slices shown]
[im 24/71  brain]
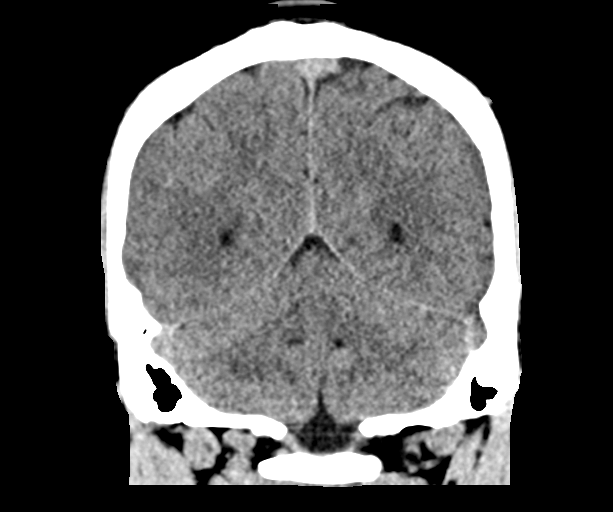
[im 32/71  brain]
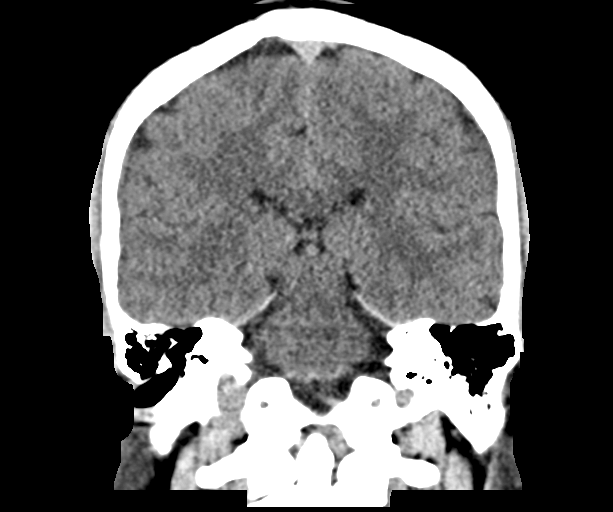
[im 39/71  brain]
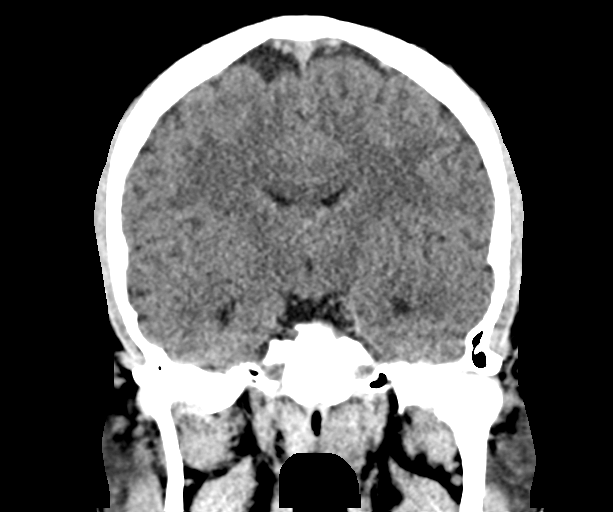

[Series 5: sag soft · sagittal · 0.33mm/px · 3 of 66 slices shown]
[im 22/66  brain]
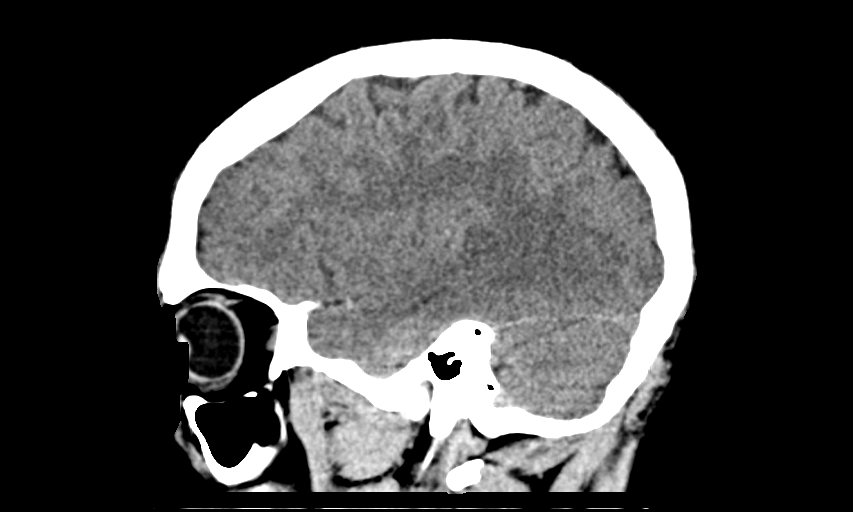
[im 33/66  brain]
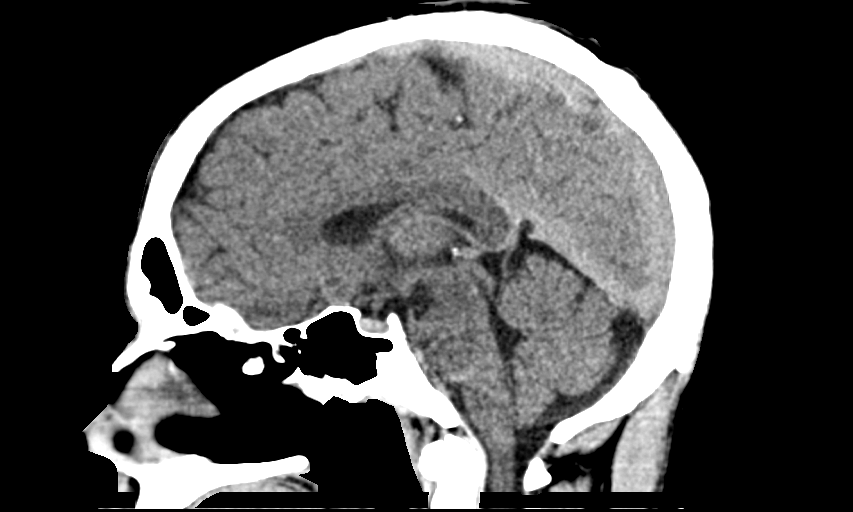
[im 44/66  brain]
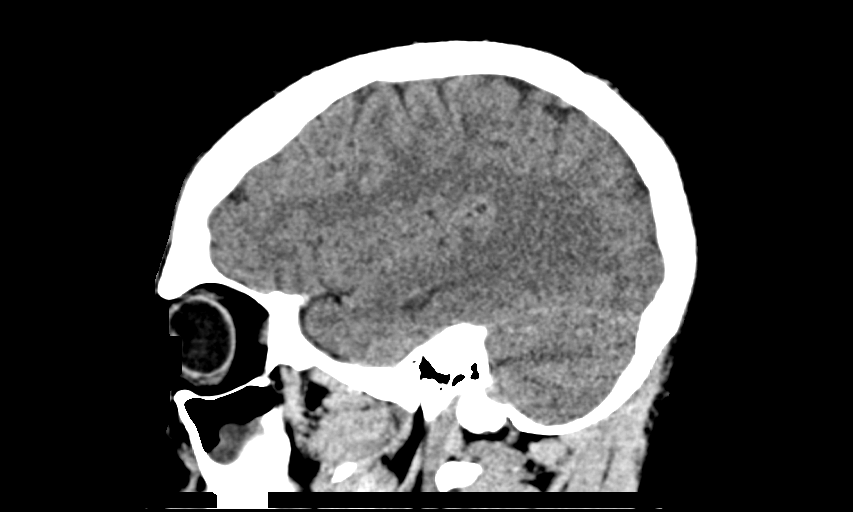

[14 of 47 positions shown; findings below may reference images not displayed]

FINDINGS: Brain: No acute intracranial abnormality. Specifically, no
hemorrhage, hydrocephalus, mass lesion, acute infarction, or
significant intracranial injury.

Vascular: No hyperdense vessel or unexpected calcification.

Skull: No acute calvarial abnormality.

Sinuses/Orbits: No acute findings

Other: None
IMPRESSION: Normal study.

## 2021-07-20 MED ORDER — MORPHINE SULFATE (PF) 4 MG/ML IV SOLN
4.0000 mg | Freq: Once | INTRAVENOUS | Status: AC
  Administered 2021-07-20: 4 mg via INTRAVENOUS
  Filled 2021-07-20: qty 1

## 2021-07-20 MED ORDER — ACETAMINOPHEN 500 MG PO TABS
1000.0000 mg | ORAL_TABLET | Freq: Once | ORAL | Status: AC
  Administered 2021-07-20: 1000 mg via ORAL
  Filled 2021-07-20: qty 2

## 2021-07-20 MED ORDER — VANCOMYCIN HCL 500 MG IV SOLR
INTRAVENOUS | Status: AC
  Filled 2021-07-20: qty 10

## 2021-07-20 MED ORDER — SODIUM CHLORIDE 0.9 % IV SOLN
2.0000 g | Freq: Two times a day (BID) | INTRAVENOUS | Status: DC
  Administered 2021-07-20 – 2021-07-21 (×2): 2 g via INTRAVENOUS
  Filled 2021-07-20 (×2): qty 20

## 2021-07-20 MED ORDER — LIDOCAINE-EPINEPHRINE (PF) 2 %-1:200000 IJ SOLN
INTRAMUSCULAR | Status: AC
  Filled 2021-07-20: qty 20

## 2021-07-20 MED ORDER — VANCOMYCIN HCL 1.5 G IV SOLR
1500.0000 mg | Freq: Once | INTRAVENOUS | Status: AC
  Administered 2021-07-20: 1500 mg via INTRAVENOUS
  Filled 2021-07-20: qty 30

## 2021-07-20 MED ORDER — VANCOMYCIN HCL 1.5 G IV SOLR
1500.0000 mg | Freq: Two times a day (BID) | INTRAVENOUS | Status: DC
  Filled 2021-07-20: qty 30

## 2021-07-20 MED ORDER — VANCOMYCIN HCL IN DEXTROSE 1-5 GM/200ML-% IV SOLN
1000.0000 mg | Freq: Once | INTRAVENOUS | Status: DC
Start: 2021-07-20 — End: 2021-07-20

## 2021-07-20 MED ORDER — VANCOMYCIN HCL IN DEXTROSE 1-5 GM/200ML-% IV SOLN: 1000.0000 mg | Freq: Three times a day (TID) | INTRAVENOUS | Status: DC

## 2021-07-20 MED ORDER — VANCOMYCIN HCL 1000 MG IV SOLR
INTRAVENOUS | Status: AC
Start: 2021-07-20 — End: 2021-07-21
  Filled 2021-07-20: qty 20

## 2021-07-20 NOTE — Progress Notes (Signed)
Pharmacy Antibiotic Note ? ?Dale Macias is a 31 y.o. male admitted on 07/20/2021 with meningitis.  Pharmacy has been consulted for vancomycin dosing. ? ?SCr wnl, WBC wnl  ? ?Plan: ?-Vancomycin 1500 mg IV load followed by vancomycin 1 gm IV Q 8 hours per traditional nomogram ?-Monitor CBC, renal fx, cultures and clinical progress ?-Vanc trough as indicated  ?-Ceftriaxone 2 gm IV Q 12 hours per MD  ? ?Height: 5\' 11"  (180.3 cm) ?Weight: 86.2 kg (190 lb) ?IBW/kg (Calculated) : 75.3 ? ?Temp (24hrs), Avg:100.4 ?F (38 ?C), Min:100.4 ?F (38 ?C), Max:100.4 ?F (38 ?C) ? ?No results for input(s): WBC, CREATININE, LATICACIDVEN, VANCOTROUGH, VANCOPEAK, VANCORANDOM, GENTTROUGH, GENTPEAK, GENTRANDOM, TOBRATROUGH, TOBRAPEAK, TOBRARND, AMIKACINPEAK, AMIKACINTROU, AMIKACIN in the last 168 hours.  ?CrCl cannot be calculated (Patient's most recent lab result is older than the maximum 21 days allowed.).   ? ?Allergies  ?Allergen Reactions  ? Benzonatate Swelling  ?  Throat swelling  ? ? ?Antimicrobials this admission: ?Ceftriaxone 4/24 >>  ?Vancomycin 4/24 >>  ? ?Dose adjustments this admission: ? ? ?Microbiology results: ? ?Thank you for allowing pharmacy to be a part of this patient?s care. ? ?Albertina Parr, PharmD., BCCCP ?Clinical Pharmacist ?Please refer to [facility] for unit-specific pharmacist  ? ? ?

## 2021-07-20 NOTE — ED Triage Notes (Signed)
Pt c/o stiff neck, chills, and headache since Sunday. Denies n/v. Endorses diarrhea.  ?

## 2021-07-20 NOTE — ED Provider Notes (Signed)
?[facility] EMERGENCY DEPARTMENT ?Provider Note ? ? ?CSN: [id number] ?Arrival date & time: 07/20/21  1914 ? ?  ? ?History ? ?Chief Complaint  ?Patient presents with  ? Torticollis  ? Headache  ? ? ?Dale Macias is a 31 y.o. male. ? ?31 year old male with a past medical history of myopericarditis Kinnie Scales presents to the ED with a chief complaint of headache, neck stiffness, fevers which began yesterday.  Cording to patient he had been out of the country and in the medical [place] last week, [place].  Does endorse a sudden onset of occipital pain that he describes as a stabbing pain radiating to the back of his neck, reports he feels "very stiff since yesterday, thought this was likely due to sleeping wrong however symptoms began to worsen today.  He developed chills, endorses shivering throughout the day.  Did not take his temperature at home, however arrived in the ED febrile with a temperature of 100.4, take some Tylenol which helped for about an hour.  Does describe this headache as "the worst headache he has ever had and I do not get headaches ".  Try to massage his next, reports there was no improvement with this either.  He is also endorsing some tightness along the bilateral aspect of his pecs, is unsure whether this pain is related. ?He describes his headache as "around the spinal cord site" thought that this was likely a tension headache but this worsened. ? ?The history is provided by the patient and medical records.  ?Headache ?Pain location:  Occipital ?Quality:  Sharp ?Radiates to:  L neck and R neck ?Severity at highest:  8/10 ?Associated symptoms: fever, neck pain and neck stiffness   ?Associated symptoms: no abdominal pain, no nausea and no vomiting   ? ?  ? ?Home Medications ?Prior to Admission medications   ?Not on File  ?   ? ?Allergies    ?Benzonatate   ? ?Review of Systems   ?Review of Systems  ?Constitutional:  Positive for chills and fever.  ?Respiratory:  Negative for  shortness of breath.   ?Cardiovascular:  Positive for chest pain.  ?Gastrointestinal:  Negative for abdominal pain, nausea and vomiting.  ?Genitourinary:  Negative for flank pain.  ?Musculoskeletal:  Positive for neck pain and neck stiffness.  ?Skin:  Negative for pallor and wound.  ?Neurological:  Positive for headaches.  ?All other systems reviewed and are negative. ? ?Physical Exam ?Updated Vital Signs ?BP 108/71   Pulse 96   Temp 100.1 ?F (37.8 ?C) (Oral)   Resp (!) 24   Ht 5\' 11"  (1.803 m)   Wt 86.2 kg   SpO2 97%   BMI 26.50 kg/m?  ?Physical Exam ?Vitals and nursing note reviewed.  ?Constitutional:   ?   Appearance: He is well-developed.  ?HENT:  ?   Head: Normocephalic and atraumatic.  ?Cardiovascular:  ?   Rate and Rhythm: Normal rate.  ?Pulmonary:  ?   Effort: Pulmonary effort is normal.  ?   Breath sounds: No wheezing.  ?Abdominal:  ?   Palpations: Abdomen is soft.  ?   Tenderness: There is no abdominal tenderness.  ?Musculoskeletal:  ?   Cervical back: Rigidity present.  ?Lymphadenopathy:  ?   Cervical: No cervical adenopathy.  ?Skin: ?   General: Skin is warm.  ?Neurological:  ?   Mental Status: He is alert and oriented to person, place, and time.  ? ? ?ED Results / Procedures / Treatments   ?Labs ?(  all labs ordered are listed, but only abnormal results are displayed) ?Labs Reviewed  ?CBC WITH DIFFERENTIAL/PLATELET - Abnormal; Notable for the following components:  ?    Result Value  ? Platelets 143 (*)   ? All other components within normal limits  ?TROPONIN I (HIGH SENSITIVITY) - Abnormal; Notable for the following components:  ? Troponin I (High Sensitivity) 91 (*)   ? All other components within normal limits  ?TROPONIN I (HIGH SENSITIVITY) - Abnormal; Notable for the following components:  ? Troponin I (High Sensitivity) 244 (*)   ? All other components within normal limits  ?RESP PANEL BY RT-PCR (FLU A&B, COVID) ARPGX2  ?CSF CULTURE W GRAM STAIN  ?COMPREHENSIVE METABOLIC PANEL  ?PROTEIN AND  GLUCOSE, CSF  ?CSF CELL COUNT WITH DIFFERENTIAL  ?CSF CELL COUNT WITH DIFFERENTIAL  ? ? ?EKG ?EKG Interpretation ? ?Date/Time:  Monday July 20 2021 21:29:40 EDT ?Ventricular Rate:  106 ?PR Interval:  201 ?QRS Duration: 83 ?QT Interval:  283 ?QTC Calculation: 376 ?R Axis:   76 ?Text Interpretation: Sinus tachycardia Borderline prolonged PR interval Borderline T abnormalities, anterior leads No significant change since last tracing Confirmed by Melene Plan [id number]) on 07/20/2021 9:44:10 PM ? ?Radiology ?CT Head Wo Contrast ? ?Result Date: 07/20/2021 ?CLINICAL DATA:  Meningitis/CNS infection suspected. Stiff neck, chills, headache EXAM: CT HEAD WITHOUT CONTRAST TECHNIQUE: Contiguous axial images were obtained from the base of the skull through the vertex without intravenous contrast. RADIATION DOSE REDUCTION: This exam was performed according to the departmental dose-optimization program which includes automated exposure control, adjustment of the mA and/or kV according to patient size and/or use of iterative reconstruction technique. COMPARISON:  None. FINDINGS: Brain: No acute intracranial abnormality. Specifically, no hemorrhage, hydrocephalus, mass lesion, acute infarction, or significant intracranial injury. Vascular: No hyperdense vessel or unexpected calcification. Skull: No acute calvarial abnormality. Sinuses/Orbits: No acute findings Other: None IMPRESSION: Normal study. Electronically Signed   By: Charlett Nose M.D.   On: 07/20/2021 21:03   ? ?Procedures ?Marland KitchenCritical Care ?Performed by: Claude Manges, PA-C ?Authorized by: Claude Manges, PA-C  ? ?Critical care provider statement:  ?  Critical care time (minutes):  45 ?  Critical care start time:  07/21/2021 10:30 PM ?  Critical care end time:  07/21/2021 11:15 PM ?  Critical care was necessary to treat or prevent imminent or life-threatening deterioration of the following conditions:  CNS failure or compromise ?  Critical care was time spent personally by me on the  following activities:  Blood draw for specimens ?  Care discussed with: admitting provider and accepting provider at another facility    ? ? ?Medications Ordered in ED ?Medications  ?cefTRIAXone (ROCEPHIN) 2 g in sodium chloride 0.9 % 100 mL IVPB (0 g Intravenous Stopped 07/20/21 2134)  ?Vancomycin (VANCOCIN) 1,500 mg in sodium chloride 0.9 % 500 mL IVPB (1,500 mg Intravenous New Bag/Given 07/20/21 2212)  ?vancomycin (VANCOCIN) 1000 MG powder (has no administration in time range)  ?vancomycin (VANCOCIN) 500 MG powder (has no administration in time range)  ?lidocaine-EPINEPHrine (XYLOCAINE W/EPI) 2 %-1:200000 (PF) injection (has no administration in time range)  ?acetaminophen (TYLENOL) tablet 1,000 mg (1,000 mg Oral Given 07/20/21 2104)  ?morphine (PF) 4 MG/ML injection 4 mg (4 mg Intravenous Given 07/20/21 2333)  ? ? ?ED Course/ Medical Decision Making/ A&P ?Clinical Course as of 07/21/21 0002  ?Mon Jul 20, 2021  ?2300 Troponin I (High Sensitivity)(!): 91 [JS]  ?2313 Troponin I (High Sensitivity)(!!): 244 [JS]  ?  ?Clinical Course  User Index ?[JS] Claude MangesSoto, Vayda Dungee, PA-C  ? ?                        ?Medical Decision Making ?Amount and/or Complexity of Data Reviewed ?Labs: ordered. Decision-making details documented in ED Course. ?Radiology: ordered. ? ?Risk ?OTC drugs. ?Prescription drug management. ? ? ? ?This patient presents to the ED for concern of neck pain, headache, this involves a number of treatment options, and is a complaint that carries with it a high risk of complications and morbidity.  The differential diagnosis includes meningitis, cranial hemorrhage, muscle spasm versus torticollis. ? ? ?Co morbidities: ?Discussed in HPI ? ? ?Brief History: ? ?Patient with a prior history of myopericarditis presents to the ED with 1 day of neck pain, headache that has been ongoing since yesterday.  Worsening symptoms described as worst headache did use typical region with radiation to the front, has had some neck rigidity  over the last 24 hours, no improvement despite Tylenol.  Thought he likely slept wrong however symptoms have continued.  So endorsing some tight band of pressure around his chest, last echocardiogram was in the mon

## 2021-07-20 NOTE — ED Provider Notes (Signed)
.  Lumbar Puncture ? ?Date/Time: 07/20/2021 11:36 PM ?Performed by: Melene Plan, DO ?Authorized by: Melene Plan, DO  ? ?Consent:  ?  Consent obtained:  Verbal ?  Consent given by:  Patient and spouse ?  Risks, benefits, and alternatives were discussed: yes   ?  Risks discussed:  Bleeding, infection, headache, nerve damage and pain ?  Alternatives discussed:  No treatment, delayed treatment, alternative treatment and observation ?Universal protocol:  ?  Procedure explained and questions answered to patient or proxy's satisfaction: yes   ?  Site/side marked: yes   ?  Patient identity confirmed:  Verbally with patient ?Pre-procedure details:  ?  Procedure purpose:  Diagnostic ?  Preparation: Patient was prepped and draped in usual sterile fashion   ?Anesthesia:  ?  Anesthesia method:  Local infiltration ?  Local anesthetic:  Lidocaine 2% WITH epi ?Procedure details:  ?  Lumbar space:  L4-L5 interspace ?  Patient position:  Sitting ?  Epidural needle gauge: 21G. ?  Needle type:  Spinal needle - Quincke tip ?  Needle length (in):  2.5 ?  Ultrasound guidance: no   ?  Number of attempts:  1 ?  Fluid appearance:  Clear ?  Tubes of fluid:  4 ?  Total volume (ml):  12 ?Post-procedure details:  ?  Puncture site:  Adhesive bandage applied ?  Procedure completion:  Tolerated well, no immediate complications ? ?  ?Melene Plan, DO ?07/22/21 0252 ? ?

## 2021-07-21 ENCOUNTER — Encounter (HOSPITAL_COMMUNITY): Payer: Self-pay | Admitting: Internal Medicine

## 2021-07-21 ENCOUNTER — Inpatient Hospital Stay (HOSPITAL_COMMUNITY): Payer: BC Managed Care – PPO

## 2021-07-21 ENCOUNTER — Other Ambulatory Visit: Payer: Self-pay

## 2021-07-21 DIAGNOSIS — R509 Fever, unspecified: Secondary | ICD-10-CM

## 2021-07-21 DIAGNOSIS — R079 Chest pain, unspecified: Secondary | ICD-10-CM

## 2021-07-21 DIAGNOSIS — R778 Other specified abnormalities of plasma proteins: Secondary | ICD-10-CM

## 2021-07-21 DIAGNOSIS — G9600 Cerebrospinal fluid leak, unspecified: Secondary | ICD-10-CM | POA: Diagnosis not present

## 2021-07-21 DIAGNOSIS — F1010 Alcohol abuse, uncomplicated: Secondary | ICD-10-CM | POA: Diagnosis present

## 2021-07-21 DIAGNOSIS — I401 Isolated myocarditis: Secondary | ICD-10-CM | POA: Diagnosis not present

## 2021-07-21 DIAGNOSIS — I319 Disease of pericardium, unspecified: Secondary | ICD-10-CM | POA: Diagnosis not present

## 2021-07-21 DIAGNOSIS — R519 Headache, unspecified: Secondary | ICD-10-CM

## 2021-07-21 DIAGNOSIS — R5081 Fever presenting with conditions classified elsewhere: Secondary | ICD-10-CM | POA: Diagnosis not present

## 2021-07-21 DIAGNOSIS — I5023 Acute on chronic systolic (congestive) heart failure: Secondary | ICD-10-CM | POA: Diagnosis present

## 2021-07-21 DIAGNOSIS — I3139 Other pericardial effusion (noninflammatory): Secondary | ICD-10-CM | POA: Diagnosis present

## 2021-07-21 DIAGNOSIS — I5022 Chronic systolic (congestive) heart failure: Secondary | ICD-10-CM | POA: Diagnosis not present

## 2021-07-21 DIAGNOSIS — Z888 Allergy status to other drugs, medicaments and biological substances status: Secondary | ICD-10-CM | POA: Diagnosis not present

## 2021-07-21 DIAGNOSIS — M436 Torticollis: Secondary | ICD-10-CM | POA: Diagnosis present

## 2021-07-21 DIAGNOSIS — Y92239 Unspecified place in hospital as the place of occurrence of the external cause: Secondary | ICD-10-CM | POA: Diagnosis not present

## 2021-07-21 DIAGNOSIS — B9789 Other viral agents as the cause of diseases classified elsewhere: Secondary | ICD-10-CM | POA: Diagnosis present

## 2021-07-21 DIAGNOSIS — F17210 Nicotine dependence, cigarettes, uncomplicated: Secondary | ICD-10-CM | POA: Diagnosis present

## 2021-07-21 DIAGNOSIS — I301 Infective pericarditis: Secondary | ICD-10-CM | POA: Diagnosis present

## 2021-07-21 DIAGNOSIS — G971 Other reaction to spinal and lumbar puncture: Secondary | ICD-10-CM | POA: Diagnosis not present

## 2021-07-21 DIAGNOSIS — Z20822 Contact with and (suspected) exposure to covid-19: Secondary | ICD-10-CM | POA: Diagnosis present

## 2021-07-21 DIAGNOSIS — G44001 Cluster headache syndrome, unspecified, intractable: Secondary | ICD-10-CM | POA: Diagnosis not present

## 2021-07-21 DIAGNOSIS — Y844 Aspiration of fluid as the cause of abnormal reaction of the patient, or of later complication, without mention of misadventure at the time of the procedure: Secondary | ICD-10-CM | POA: Diagnosis not present

## 2021-07-21 DIAGNOSIS — R6510 Systemic inflammatory response syndrome (SIRS) of non-infectious origin without acute organ dysfunction: Secondary | ICD-10-CM | POA: Diagnosis not present

## 2021-07-21 LAB — ECHOCARDIOGRAM COMPLETE
AR max vel: 3 cm2
AV Area VTI: 3.05 cm2
AV Area mean vel: 2.99 cm2
AV Mean grad: 2 mmHg
AV Peak grad: 3.5 mmHg
Ao pk vel: 0.94 m/s
Area-P 1/2: 2.21 cm2
Calc EF: 32.7 %
Height: 71 in
S' Lateral: 4.2 cm
Single Plane A2C EF: 29.6 %
Single Plane A4C EF: 34.9 %
Weight: 3040 oz

## 2021-07-21 LAB — RAPID URINE DRUG SCREEN, HOSP PERFORMED
Amphetamines: NOT DETECTED
Barbiturates: NOT DETECTED
Benzodiazepines: NOT DETECTED
Cocaine: NOT DETECTED
Opiates: POSITIVE — AB
Tetrahydrocannabinol: NOT DETECTED

## 2021-07-21 LAB — PROCALCITONIN: Procalcitonin: 0.11 ng/mL

## 2021-07-21 LAB — CSF CELL COUNT WITH DIFFERENTIAL
RBC Count, CSF: 2 /mm3 — ABNORMAL HIGH
RBC Count, CSF: 2 /mm3 — ABNORMAL HIGH
Tube #: 1
Tube #: 4
WBC, CSF: 0 /mm3 (ref 0–5)
WBC, CSF: 2 /mm3 (ref 0–5)

## 2021-07-21 LAB — LACTIC ACID, PLASMA
Lactic Acid, Venous: 0.8 mmol/L (ref 0.5–1.9)
Lactic Acid, Venous: 0.7 mmol/L (ref 0.5–1.9)
Lactic Acid, Venous: 1 mmol/L (ref 0.5–1.9)

## 2021-07-21 LAB — URINALYSIS, ROUTINE W REFLEX MICROSCOPIC
Bilirubin Urine: NEGATIVE
Glucose, UA: NEGATIVE mg/dL
Hgb urine dipstick: NEGATIVE
Ketones, ur: NEGATIVE mg/dL
Leukocytes,Ua: NEGATIVE
Nitrite: NEGATIVE
Protein, ur: NEGATIVE mg/dL
Specific Gravity, Urine: 1.008 (ref 1.005–1.030)
pH: 6 (ref 5.0–8.0)

## 2021-07-21 LAB — HIV ANTIBODY (ROUTINE TESTING W REFLEX): HIV Screen 4th Generation wRfx: NONREACTIVE

## 2021-07-21 LAB — C-REACTIVE PROTEIN: CRP: 4.5 mg/dL — ABNORMAL HIGH (ref <1.0)

## 2021-07-21 LAB — TROPONIN I (HIGH SENSITIVITY)
Troponin I (High Sensitivity): 1123 ng/L (ref <18)
Troponin I (High Sensitivity): 2022 ng/L (ref <18)

## 2021-07-21 LAB — TSH: TSH: 0.974 u[IU]/mL (ref 0.350–4.500)

## 2021-07-21 LAB — PARASITE EXAM SCREEN, BLOOD-W CONF TO LABCORP (NOT @ ARMC)

## 2021-07-21 LAB — SEDIMENTATION RATE: Sed Rate: 8 mm/hr (ref 0–16)

## 2021-07-21 LAB — CK: Total CK: 600 U/L — ABNORMAL HIGH (ref 49–397)

## 2021-07-21 LAB — PHOSPHORUS: Phosphorus: 3.5 mg/dL (ref 2.5–4.6)

## 2021-07-21 LAB — MAGNESIUM: Magnesium: 1.9 mg/dL (ref 1.7–2.4)

## 2021-07-21 IMAGING — DX DG CHEST 2V
2 series · 2 of 2 positions shown · non-contrast
Comparison: Chest x-ray dated [DATE]

CLINICAL DATA: Chest pain/shortness of breath

EXAM:
CHEST - 2 VIEW

[chest lat]
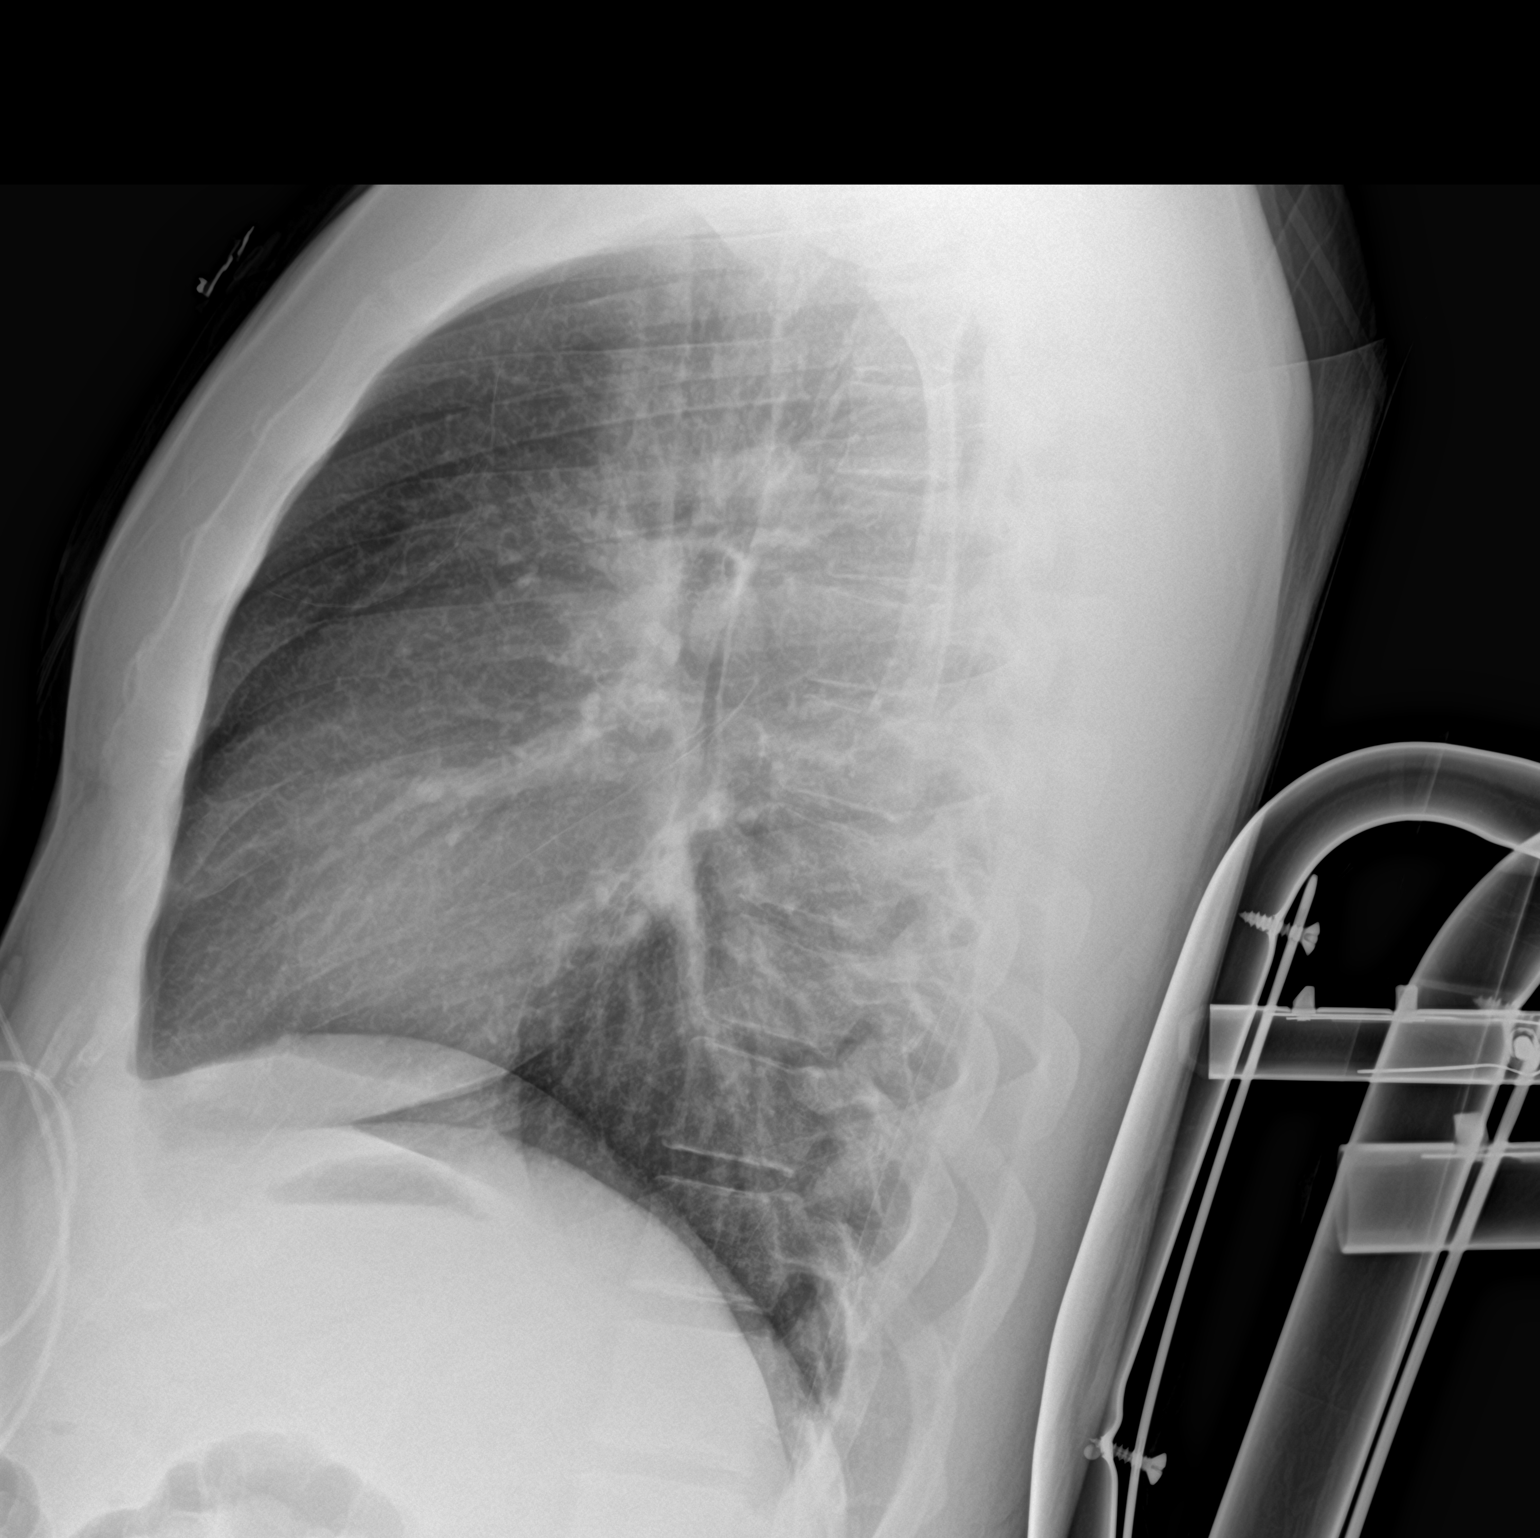

[chest ap]
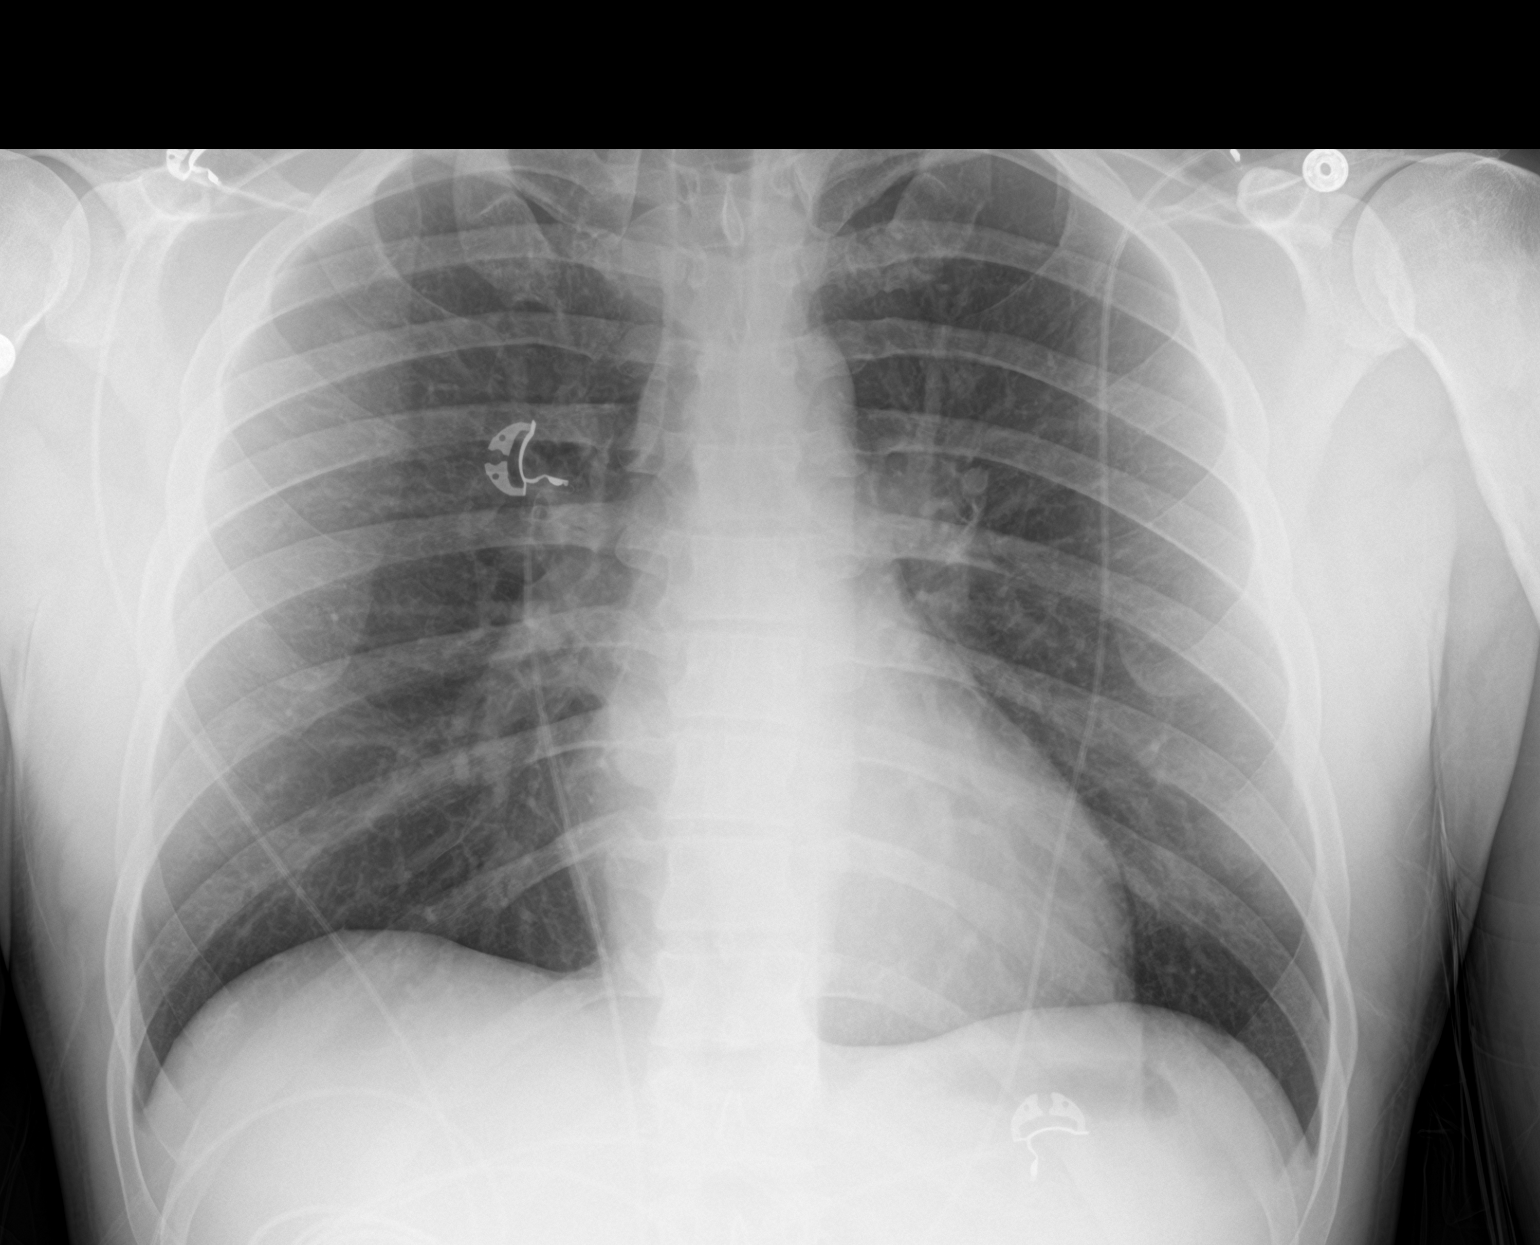

[2 of 2 positions shown; findings below may reference images not displayed]

FINDINGS: The heart size and mediastinal contours are within normal limits.
Both lungs are clear. The visualized skeletal structures are
unremarkable.
IMPRESSION: No active cardiopulmonary disease.

## 2021-07-21 IMAGING — DX DG CHEST 1V
1 series · 1 of 1 positions shown · non-contrast
Comparison: Frontal and lateral views earlier today

CLINICAL DATA: CHF.

EXAM:
CHEST  1 VIEW

[chest ap]
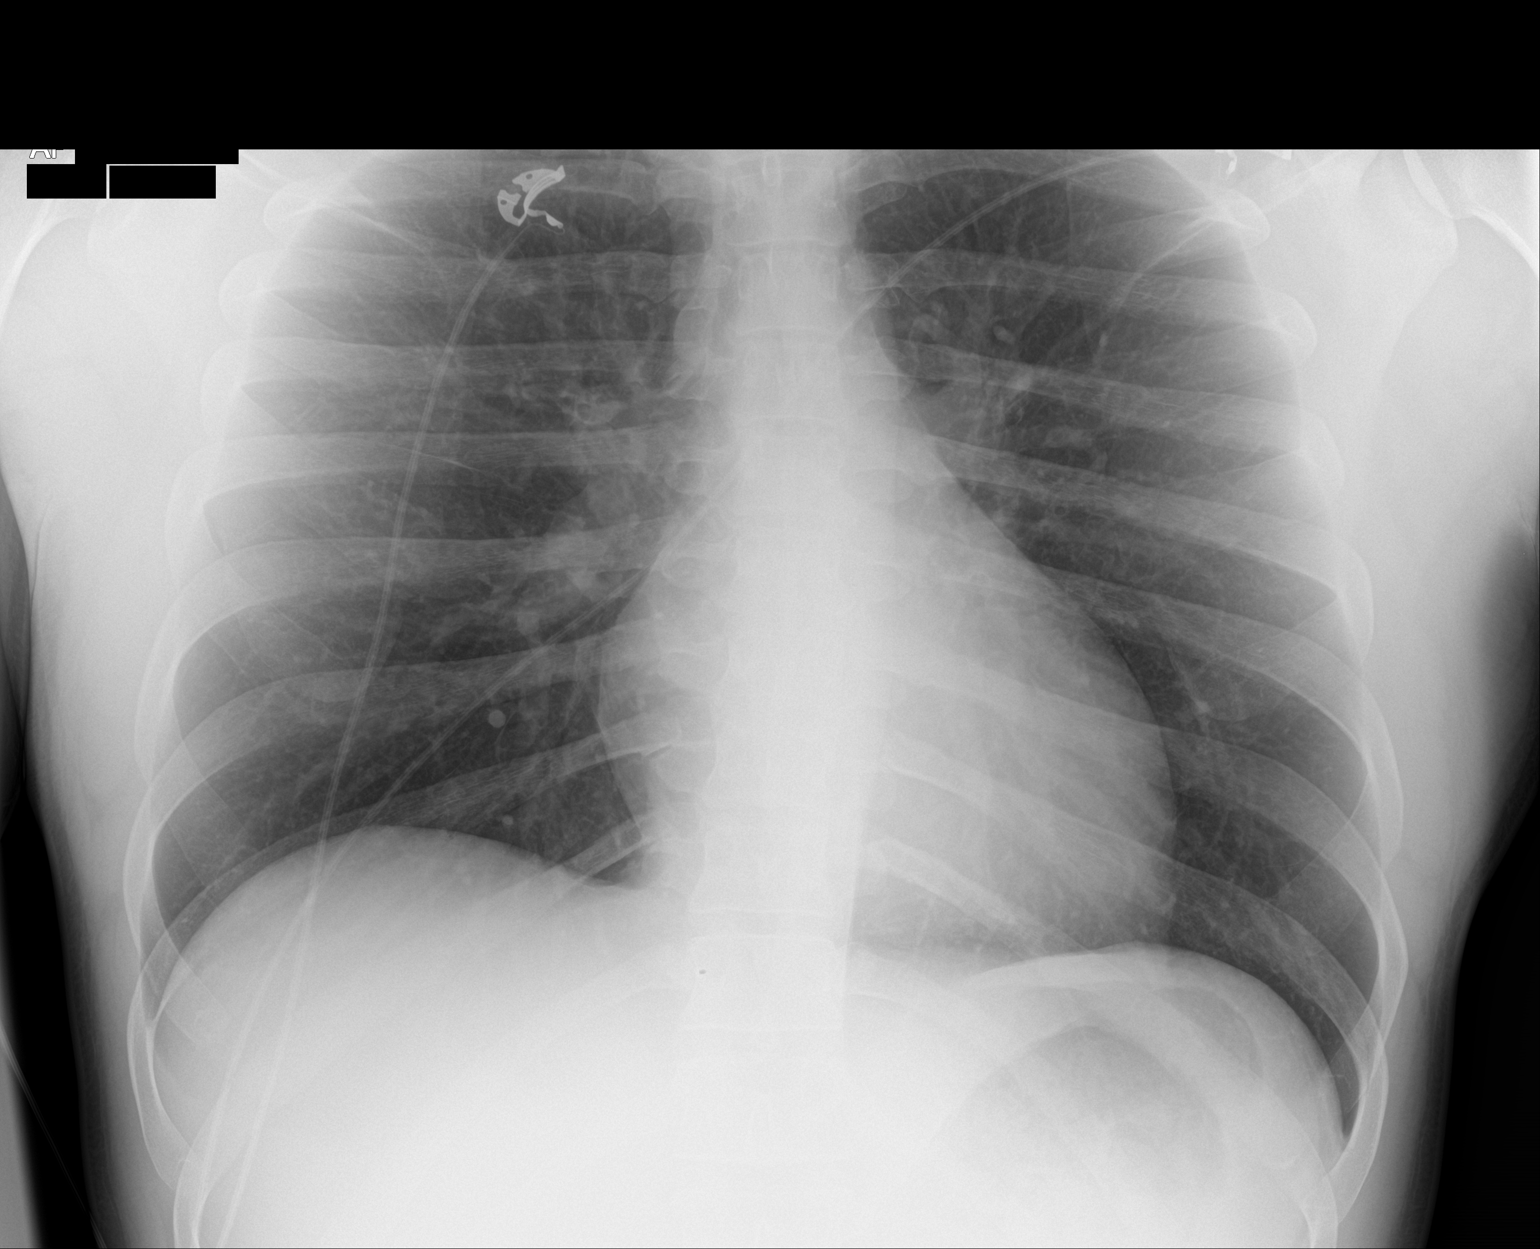

[1 of 1 positions shown; findings below may reference images not displayed]

FINDINGS: The heart is normal in size.The cardiomediastinal contours are
normal. The lungs are clear. Pulmonary vasculature is normal. No
consolidation, pleural effusion, or pneumothorax. No acute osseous
abnormalities are seen.
IMPRESSION: Negative portable AP view of the chest.

## 2021-07-21 MED ORDER — HYDROMORPHONE HCL 1 MG/ML IJ SOLN
0.5000 mg | INTRAMUSCULAR | Status: DC | PRN
  Administered 2021-07-21 – 2021-07-22 (×3): 0.5 mg via INTRAVENOUS
  Filled 2021-07-21 (×4): qty 0.5

## 2021-07-21 MED ORDER — LIDOCAINE 5 % EX PTCH
1.0000 | MEDICATED_PATCH | CUTANEOUS | Status: DC
  Administered 2021-07-21 – 2021-07-23 (×3): 1 via TRANSDERMAL
  Filled 2021-07-21 (×5): qty 1

## 2021-07-21 MED ORDER — IBUPROFEN 800 MG PO TABS
800.0000 mg | ORAL_TABLET | Freq: Three times a day (TID) | ORAL | Status: DC
Start: 2021-07-21 — End: 2021-07-25
  Administered 2021-07-21 – 2021-07-25 (×12): 800 mg via ORAL
  Filled 2021-07-21 (×12): qty 1

## 2021-07-21 MED ORDER — HYDROMORPHONE HCL 1 MG/ML IJ SOLN
INTRAMUSCULAR | Status: AC
  Filled 2021-07-21: qty 0.5

## 2021-07-21 MED ORDER — ACETAMINOPHEN 325 MG PO TABS
650.0000 mg | ORAL_TABLET | Freq: Once | ORAL | Status: AC
  Administered 2021-07-21: 650 mg via ORAL
  Filled 2021-07-21: qty 2

## 2021-07-21 MED ORDER — ONDANSETRON HCL 4 MG/2ML IJ SOLN: 4.0000 mg | Freq: Four times a day (QID) | INTRAMUSCULAR | Status: DC | PRN

## 2021-07-21 MED ORDER — MORPHINE SULFATE (PF) 4 MG/ML IV SOLN
4.0000 mg | Freq: Once | INTRAVENOUS | Status: AC
  Administered 2021-07-21: 4 mg via INTRAVENOUS
  Filled 2021-07-21: qty 1

## 2021-07-21 MED ORDER — VANCOMYCIN HCL IN DEXTROSE 1-5 GM/200ML-% IV SOLN
1000.0000 mg | Freq: Three times a day (TID) | INTRAVENOUS | Status: DC
  Administered 2021-07-21: 1000 mg via INTRAVENOUS
  Filled 2021-07-21: qty 200

## 2021-07-21 MED ORDER — SODIUM CHLORIDE 0.9 % IV SOLN
100.0000 mg | Freq: Two times a day (BID) | INTRAVENOUS | Status: DC
  Administered 2021-07-21 – 2021-07-23 (×5): 100 mg via INTRAVENOUS
  Filled 2021-07-21 (×6): qty 100

## 2021-07-21 MED ORDER — KETOROLAC TROMETHAMINE 30 MG/ML IJ SOLN: 30.0000 mg | Freq: Four times a day (QID) | INTRAMUSCULAR | Status: DC | PRN

## 2021-07-21 MED ORDER — ONDANSETRON HCL 4 MG PO TABS: 4.0000 mg | ORAL_TABLET | Freq: Four times a day (QID) | ORAL | Status: DC | PRN

## 2021-07-21 MED ORDER — BUTALBITAL-APAP-CAFFEINE 50-325-40 MG PO TABS
1.0000 | ORAL_TABLET | Freq: Four times a day (QID) | ORAL | Status: DC | PRN
  Administered 2021-07-22 – 2021-07-24 (×7): 1 via ORAL
  Filled 2021-07-21 (×7): qty 1

## 2021-07-21 MED ORDER — COLCHICINE 0.6 MG PO TABS
0.6000 mg | ORAL_TABLET | Freq: Two times a day (BID) | ORAL | Status: DC
  Administered 2021-07-21 – 2021-07-25 (×9): 0.6 mg via ORAL
  Filled 2021-07-21 (×9): qty 1

## 2021-07-21 NOTE — TOC Initial Note (Signed)
Transition of Care (TOC) - Initial/Assessment Note  ? ? ?Patient Details  ?Name: Dale Macias ?MRN: [id number] ?Date of Birth: [birth year] ? ?Transition of Care [facility]) CM/SW Contact:    ?Golda Acre, RN ?Phone Number: ?07/21/2021, 11:11 AM ? ?Clinical Narrative:                 ? ?Transition of Care (TOC) Screening Note ? ? ?Patient Details  ?Name: Dale Macias ?Date of Birth: [birth year] ? ? ?Transition of Care [facility]) CM/SW Contact:    ?Golda Acre, RN ?Phone Number: ?07/21/2021, 11:11 AM ? ? ? ?Transition of Care Department [facility]) has reviewed patient and no TOC needs have been identified at this time. We will continue to monitor patient advancement through interdisciplinary progression rounds. If new patient transition needs arise, please place a TOC consult. ? ? ? ?Expected Discharge Plan: Home/Self Care ?Barriers to Discharge: No Barriers Identified ? ? ?Patient Goals and CMS Choice ?Patient states their goals for this hospitalization and ongoing recovery are:: to go home ?CMS Medicare.gov Compare Post Acute Care list provided to:: Patient ?  ? ?Expected Discharge Plan and Services ?Expected Discharge Plan: Home/Self Care ?  ?Discharge Planning Services: CM Consult ?  ?Living arrangements for the past 2 months: Single Family Home ?                ?  ?  ?  ?  ?  ?  ?  ?  ?  ?  ? ?Prior Living Arrangements/Services ?Living arrangements for the past 2 months: Single Family Home ?Lives with:: Self ?Patient language and need for interpreter reviewed:: Yes ?Do you feel safe going back to the place where you live?: Yes      ?  ?  ?  ?  ? ?Activities of Daily Living ?Home Assistive Devices/Equipment: None ?ADL Screening (condition at time of admission) ?Patient's cognitive ability adequate to safely complete daily activities?: Yes ?Is the patient deaf or have difficulty hearing?: No ?Does the patient have difficulty seeing, even when wearing glasses/contacts?: No ?Does the patient have difficulty  concentrating, remembering, or making decisions?: No ?Patient able to express need for assistance with ADLs?: Yes ?Does the patient have difficulty dressing or bathing?: No ?Independently performs ADLs?: Yes (appropriate for developmental age) ?Does the patient have difficulty walking or climbing stairs?: No ?Weakness of Legs: None ?Weakness of Arms/Hands: None ? ?Permission Sought/Granted ?  ?  ?   ?   ?   ?   ? ?Emotional Assessment ?Appearance:: Appears stated age ?  ?  ?Orientation: : Oriented to Self, Oriented to Place, Oriented to  Time, Oriented to Situation ?Alcohol / Substance Use: Not Applicable ?Psych Involvement: No (comment) ? ?Admission diagnosis:  Neck rigidity [M43.6] ?Bad headache [R51.9] ?Headache [R51.9] ?Patient Active Problem List  ? Diagnosis Date Noted  ? Headache 07/21/2021  ? SIRS (systemic inflammatory response syndrome) (HCC) 12/13/2020  ? Febrile illness 12/13/2020  ? 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 ?PCP:  Pcp, No ?Pharmacy:   ?[facility] [place],  - [address] ?[address] ?[place] [address] ?Phone: [phone] Fax: [phone] ? ? ? ? ?Social Determinants of Health (SDOH) Interventions ?  ? ?Readmission Risk Interventions ?   ? View : No data to display.  ?  ?  ?  ? ? ? ?

## 2021-07-21 NOTE — ED Notes (Signed)
Patient denies pain and is resting comfortably.  

## 2021-07-21 NOTE — ED Notes (Signed)
Phone Handoff Report given to Marylen Ponto at [facility] 4th Floor ?

## 2021-07-21 NOTE — ED Notes (Signed)
MAE x 4, alert and oriented, taking in PO's well. Family at bedside, moves self in bed, does cont to c/o HA. Bandaid on back at lumbar site for LP has very sm amt of old blood in place, slightly tender at time, no active bleeding noted.  ?

## 2021-07-21 NOTE — Progress Notes (Signed)
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  As of now patient is euvolemic, and blood pressure borderline low, hold off diuresis and ACEI/ARB. ? ?Monitor O2 saturation breathing status and repeat chest x-ray tomorrow to decide diuresis plan. ?

## 2021-07-21 NOTE — ED Notes (Signed)
Alert and oriented, GCS 15, MAE x 4, denies any calf pain and neck pain, taking in POs well. Heart Sounds WNL, Resp even and non-labored ?

## 2021-07-21 NOTE — Progress Notes (Signed)
Echocardiogram ?2D Echocardiogram has been performed. ? ?Dale Macias ?07/21/2021, 3:13 PM ?

## 2021-07-21 NOTE — Consult Note (Addendum)
?  [facility]  Total days of antibiotics 2 ?      ?Reason for Consult:fever in returned traveler   ?Referring Physician: zhang ? ?Principal Problem: ?  Headache ?Active Problems: ?  SIRS (systemic inflammatory response syndrome) (HCC) ?  Febrile illness ?  Myocarditis (HCC) ?  Fever ? ? ? ?HPI: Dale Macias is a 31 y.o. 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 He is otherwise healthy. He travelled to [place] from 4[place]11-4/15, stayed at resort, went snorkling twice. Ate at resort, had ceviche but no other excursions. Did not take any pre-travel prophylaxis. He does not recall having any insect bites. [facility]. He went fishing in [place] on Friday, stayed in boat but did not swim. Handled fish and bait.He did have 4-5 bouts of diarrhea that self resolved on Saturday then started to feel poorly early Sunday April 23, with chills, followed by headache. Headache started in occiput radiating to neck and shoulders with fever of 100.37F. he underwent LP concerning for meningitis that showed WBC 0 normal protein and normal glu. Gram stain negative. His wbc (plt of 143 -- BL in the 160-220s) and cmp are WNL. CXR is clear. His trops elevated. He was started on vanco/ceftriaxone/doxycycline. He is feeling much better this evening. Headache is much improved.  ? ? ?Past Medical History:  ?Diagnosis Date  ? Myocarditis (HCC) 11/2020  ? Myopericarditis 11/2020  ? ? ?Allergies:  ?Allergies  ?Allergen Reactions  ? Benzonatate Swelling  ?  Throat swelling  ? ? ?Current antibiotics: ? ? ?MEDICATIONS: ? colchicine  0.6 mg Oral BID  ? HYDROmorphone      ? ibuprofen  800 mg Oral TID  ? lidocaine  1 patch Transdermal Q24H  ? ? ?Social History  ? ?Tobacco Use  ? Smoking status: Some Days  ?  Types: Cigarettes  ? Smokeless tobacco: Never  ?Substance Use Topics  ? Alcohol use: Yes  ?   Comment: 4-12 oz nightly  ? Drug use: Never  ? ? ?History reviewed. No pertinent family history. ? ?Review of Systems -  ?12 point ros is negative except what is mentioned above ? ?OBJECTIVE: ?Temp:  [98 ?F (36.7 ?C)-102.4 ?F (39.1 ?C)] 99.1 ?F (37.3 ?C) (04/25 1404) ?Pulse Rate:  [83-110] 85 (04/25 1404) ?Resp:  [14-29] 16 (04/25 1404) ?BP: (101-130)/(63-88) 104/79 (04/25 1404) ?SpO2:  [95 %-100 %] 100 % (04/25 1404) ?Weight:  [86.2 kg] 86.2 kg (04/24 1928) ?Physical Exam  ?Constitutional: He is oriented to person, place, and time. He appears well-developed and well-nourished. No distress.  ?HENT:  ?Mouth/Throat: Oropharynx is clear and moist. No oropharyngeal exudate.  ?Abdominal: Soft. Bowel sounds are normal. He exhibits no distension. There is no tenderness.  ?Lymphadenopathy:  ?He has no cervical adenopathy.  ?Neurological: He is alert and oriented to person, place, and time.  ?Skin: Skin is warm and dry. No rash noted. No erythema.  ?Psychiatric: He has a normal mood and affect. His behavior is normal.  ? ? ? ?LABS: ?Results for orders placed or performed during the hospital encounter of 07/20/21 (from the past 48 hour(s))  ?CBC with Differential     Status: Abnormal  ? Collection Time: 07/20/21  8:45 PM  ?Result Value Ref Range  ? WBC 5.6 4.0 - 10.5 K/uL  ? RBC 5.22 4.22 - 5.81 MIL/uL  ? Hemoglobin  15.1 13.0 - 17.0 g/dL  ? HCT 45.2 39.0 - 52.0 %  ? MCV 86.6 80.0 - 100.0 fL  ? MCH 28.9 26.0 - 34.0 pg  ? MCHC 33.4 30.0 - 36.0 g/dL  ? RDW 12.8 11.5 - 15.5 %  ? Platelets 143 (L) 150 - 400 K/uL  ? nRBC 0.0 0.0 - 0.2 %  ? Neutrophils Relative % 68 %  ? Neutro Abs 3.8 1.7 - 7.7 K/uL  ? Lymphocytes Relative 14 %  ? Lymphs Abs 0.8 0.7 - 4.0 K/uL  ? Monocytes Relative 18 %  ? Monocytes Absolute 1.0 0.1 - 1.0 K/uL  ? Eosinophils Relative 0 %  ? Eosinophils Absolute 0.0 0.0 - 0.5 K/uL  ? Basophils Relative 0 %  ? Basophils Absolute 0.0 0.0 - 0.1 K/uL  ? Immature Granulocytes 0 %  ? Abs Immature Granulocytes 0.01 0.00 -  0.07 K/uL  ?  Comment: Performed at [facility], [address], [place], [place] [address]  ?Comprehensive metabolic panel     Status: None  ? Collection Time: 07/20/21  8:45 PM  ?Result Value Ref Range  ? Sodium 138 135 - 145 mmol/L  ? Potassium 3.8 3.5 - 5.1 mmol/L  ? Chloride 103 98 - 111 mmol/L  ? CO2 29 22 - 32 mmol/L  ? Glucose, Bld 98 70 - 99 mg/dL  ?  Comment: Glucose reference range applies only to samples taken after fasting for at least 8 hours.  ? BUN 16 6 - 20 mg/dL  ? Creatinine, Ser 1.12 0.61 - 1.24 mg/dL  ? Calcium 9.1 8.9 - 10.3 mg/dL  ? Total Protein 7.3 6.5 - 8.1 g/dL  ? Albumin 4.1 3.5 - 5.0 g/dL  ? AST 37 15 - 41 U/L  ? ALT 35 0 - 44 U/L  ? Alkaline Phosphatase 49 38 - 126 U/L  ? Total Bilirubin 0.4 0.3 - 1.2 mg/dL  ? GFR, Estimated >60 >60 mL/min  ?  Comment: (NOTE) ?Calculated using the CKD-EPI Creatinine Equation (2021) ?  ? Anion gap 6 5 - 15  ?  Comment: Performed at [facility], [address] [place], [place] [address]  ?Resp Panel by RT-PCR (Flu A&B, Covid) Nasopharyngeal Swab     Status: None  ? Collection Time: 07/20/21  8:45 PM  ? Specimen: Nasopharyngeal Swab; Nasopharyngeal(NP) swabs in vial transport medium  ?Result Value Ref Range  ? SARS Coronavirus 2 by RT PCR NEGATIVE NEGATIVE  ?  Comment: (NOTE) ?SARS-CoV-2 target nucleic acids are NOT DETECTED. ? ?The SARS-CoV-2 RNA is generally detectable in upper respiratory ?specimens during the acute phase of infection. The lowest ?concentration of SARS-CoV-2 viral copies this assay can detect is ?138 copies/mL. A negative result does not preclude SARS-Cov-2 ?infection and should not be used as the sole basis for treatment or ?other patient management decisions. A negative result may occur with  ?improper specimen collection/handling, submission of specimen other ?than nasopharyngeal swab, presence of viral mutation(s) within the ?areas targeted by this assay, and inadequate number of viral ?copies(<138  copies/mL). A negative result must be combined with ?clinical observations, patient history, and epidemiological ?information. The expected result is Negative. ? ?Fact Sheet for Patients:  ?BloggerCourse.com ? ?Fact Sheet for Healthcare Providers:  ?SeriousBroker.it ? ?This test is no t yet approved or cleared by the [place] FDA and  ?has been authorized for detection and/or diagnosis of SARS-CoV-2 by ?FDA under an Emergency Use Authorization (EUA). This  EUA will remain  ?in effect (meaning this test can be used) for the duration of the ?COVID-19 declaration under Section 564(b)(1) of the Act, 21 ?U.S.C.section 360bbb-3(b)(1), unless the authorization is terminated  ?or revoked sooner.  ? ? ?  ? Influenza A by PCR NEGATIVE NEGATIVE  ? Influenza B by PCR NEGATIVE NEGATIVE  ?  Comment: (NOTE) ?The Xpert Xpress SARS-CoV-2/FLU/RSV plus assay is intended as an aid ?in the diagnosis of influenza from Nasopharyngeal swab specimens and ?should not be used as a sole basis for treatment. Nasal washings and ?aspirates are unacceptable for Xpert Xpress SARS-CoV-2/FLU/RSV ?testing. ? ?Fact Sheet for Patients: ?BloggerCourse.com ? ?Fact Sheet for Healthcare Providers: ?SeriousBroker.it ? ?This test is not yet approved or cleared by the [place] FDA and ?has been authorized for detection and/or diagnosis of SARS-CoV-2 by ?FDA under an Emergency Use Authorization (EUA). This EUA will remain ?in effect (meaning this test can be used) for the duration of the ?COVID-19 declaration under Section 564(b)(1) of the Act, 21 U.S.C. ?section 360bbb-3(b)(1), unless the authorization is terminated or ?revoked. ? ?Performed at [facility], [address] [place] ?[place], [place] [address] ?  ?Troponin I (High Sensitivity)     Status: Abnormal  ? Collection Time: 07/20/21  8:45 PM  ?Result Value Ref Range  ? Troponin I (High  Sensitivity) 91 (H) <18 ng/L  ?  Comment: (NOTE) ?Elevated high sensitivity troponin I (hsTnI) values and significant  ?changes across serial measurements may suggest ACS but many other  ?chronic and acute conditions are

## 2021-07-21 NOTE — ED Notes (Signed)
Phone Handoff Report provided to [facility] with [facility] ?

## 2021-07-21 NOTE — Progress Notes (Signed)
D/W cardiology Dr. Eden Emms, records reviewed and cardiology recommend starting Motrin 800 mg TID and Colchicine. Echo ordered. ? ?Check Coxsacki Viral ab. ?

## 2021-07-21 NOTE — H&P (Addendum)
?History and Physical  ? ? ?ISAIR INABINET INO:[id number] DOB: [birth year] DOA: 07/20/2021 ? ?PCP: Pcp, No (Confirm with patient/family/NH records and if not entered, this has to be entered at [facility] point of entry) ?Patient coming from: [place] ? ?I have personally briefly reviewed patient's old medical records in [place] ? ?Chief Complaint: Headache and neck pain ? ?HPI: Dale Macias is a 31 y.o. 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 ? ?Treatment underwent a trip to [place] and came back home on April 15.  Then, he went to to a fishing trip last Saturday, and Sunday morning, patient woke up with new onset of lower back itchiness, he was not sure about when it worsened the rash, and same time, he developed a severe 10/10 headache, constant, " pounding like, feels like the eyeballs will pop up" feel nauseous but no vomiting.  He also complained about frequent episodes of chills and subjective fever but he did not check his temperature at that point.  And gradually he developed a squeezing like neck and shoulder pain.  He also felt occasional light pressure-like chest pain, but no cough no shortness of breath, he did not notice any exacerbation or relieving factors.  Same-day, he also had 1 episode of loose bowel movement but then resolved.  Over the last 2 days, headache has been progressively getting worse, denies any light sensitivity, no rash no cough, no shortness of breath, no urinary problems and no more diarrhea since Sunday.  And he denies any herpes lesions or any oral ulcers, no joint pains. ? ?ED Course: Low-grade fever 100.4, borderline tachycardia no hypotension. ? ?WBC 5.6, creatinine 1.1, glucose 98 COVID-negative, troponin 93> 244 EKG showed no acute ST changes.  LP performed in ED, which  you did clear fluid with normal pressure WBC 2 and CBC 2. ? ?Patient was started on vancomycin and ceftriaxone empirically for meningitis. ? ?Review of Systems: As per HPI otherwise 10 point review of systems negative.  ? ? ?Past Medical History:  ?Diagnosis Date  ? Myocarditis ([place]) 11/2020  ? Myopericarditis 11/2020  ? ? ?History reviewed. No pertinent surgical history. ? ? reports that he has been smoking cigarettes. He has never used smokeless tobacco. He reports current alcohol use. He reports that he does not use drugs. ? ?Allergies  ?Allergen Reactions  ? Benzonatate Swelling  ?  Throat swelling  ? ? ?History reviewed. No pertinent family history. ? ? ?Prior to Admission medications   ?Not on File  ? ? ?Physical Exam: ?Vitals:  ? 07/21/21 0300 07/21/21 0626 07/21/21 0744 07/21/21 1022  ?BP: 102/76 103/67 106/69 104/70  ?Pulse: 86 (!) 103 (!) 104 84  ?Resp: $Remov'20 16 20   'REmUsa$ ?Temp:   (!) 102.4 ?F (39.1 ?C) 98 ?F (36.7 ?C)  ?TempSrc:   Oral Oral  ?SpO2: 99% 97% 96% 100%  ?Weight:      ?Height:      ? ? ?Constitutional: NAD, calm, comfortable ?Vitals:  ? 07/21/21 0300 07/21/21 0626 07/21/21 0744 07/21/21 1022  ?BP: 102/76 103/67 106/69 104/70  ?Pulse: 86 (!) 103 (!) 104 84  ?Resp: $Remov'20 16 20   'iqwJjR$ ?Temp:   (!) 102.4 ?F (39.1 ?C) 98 ?F (36.7 ?C)  ?TempSrc:   Oral Oral  ?SpO2: 99% 97% 96% 100%  ?Weight:      ?Height:      ? ?  Eyes: PERRL, lids and conjunctivae normal ?ENMT: Mucous membranes are moist. Posterior pharynx clear of any exudate or lesions.Normal dentition.  ?Neck: normal, supple, no masses, no thyromegaly ?Respiratory: clear to auscultation bilaterally, no wheezing, no crackles. Normal respiratory effort. No accessory muscle use.  ?Cardiovascular: Regular rate and rhythm, no murmurs / rubs / gallops. No extremity edema. 2+ pedal pulses. No carotid bruits.  ?Abdomen: no tenderness, no masses palpated. No hepatosplenomegaly. Bowel sounds positive.  ?Musculoskeletal: no clubbing / cyanosis. No joint deformity upper and  lower extremities. Good ROM, no contractures. Normal muscle tone.  ?Skin: no rashes, lesions, ulcers. No induration ?Neurologic: CN 2-12 grossly intact. Sensation intact, DTR normal. Strength 5/5 in all 4.  ?Psychiatric: Normal judgment and insight. Alert and oriented x 3. Normal mood.  ? ? ? ?Labs on Admission: I have personally reviewed following labs and imaging studies ? ?CBC: ?Recent Labs  ?Lab 07/20/21 ?2045  ?WBC 5.6  ?NEUTROABS 3.8  ?HGB 15.1  ?HCT 45.2  ?MCV 86.6  ?PLT 143*  ? ?Basic Metabolic Panel: ?Recent Labs  ?Lab 07/20/21 ?2045  ?NA 138  ?K 3.8  ?CL 103  ?CO2 29  ?GLUCOSE 98  ?BUN 16  ?CREATININE 1.12  ?CALCIUM 9.1  ? ?GFR: ?Estimated Creatinine Clearance: 103.6 mL/min (by C-G formula based on SCr of 1.12 mg/dL). ?Liver Function Tests: ?Recent Labs  ?Lab 07/20/21 ?2045  ?AST 37  ?ALT 35  ?ALKPHOS 49  ?BILITOT 0.4  ?PROT 7.3  ?ALBUMIN 4.1  ? ?No results for input(s): LIPASE, AMYLASE in the last 168 hours. ?No results for input(s): AMMONIA in the last 168 hours. ?Coagulation Profile: ?No results for input(s): INR, PROTIME in the last 168 hours. ?Cardiac Enzymes: ?No results for input(s): CKTOTAL, CKMB, CKMBINDEX, TROPONINI in the last 168 hours. ?BNP (last 3 results) ?No results for input(s): PROBNP in the last 8760 hours. ?HbA1C: ?No results for input(s): HGBA1C in the last 72 hours. ?CBG: ?No results for input(s): GLUCAP in the last 168 hours. ?Lipid Profile: ?No results for input(s): CHOL, HDL, LDLCALC, TRIG, CHOLHDL, LDLDIRECT in the last 72 hours. ?Thyroid Function Tests: ?No results for input(s): TSH, T4TOTAL, FREET4, T3FREE, THYROIDAB in the last 72 hours. ?Anemia Panel: ?No results for input(s): VITAMINB12, FOLATE, FERRITIN, TIBC, IRON, RETICCTPCT in the last 72 hours. ?Urine analysis: ?   ?Component Value Date/Time  ? COLORURINE YELLOW (A) 12/13/2020 0001  ? APPEARANCEUR CLEAR (A) 12/13/2020 0001  ? LABSPEC 1.010 12/13/2020 0001  ? PHURINE 6.0 12/13/2020 0001  ? GLUCOSEU NEGATIVE 12/13/2020  0001  ? HGBUR NEGATIVE 12/13/2020 0001  ? BILIRUBINUR NEGATIVE 12/13/2020 0001  ? KETONESUR TRACE (A) 12/13/2020 0001  ? PROTEINUR NEGATIVE 12/13/2020 0001  ? NITRITE NEGATIVE 12/13/2020 0001  ? LEUKOCYTESUR NEGATIVE 12/13/2020 0001  ? ? ?Radiological Exams on Admission: ?CT Head Wo Contrast ? ?Result Date: 07/20/2021 ?CLINICAL DATA:  Meningitis/CNS infection suspected. Stiff neck, chills, headache EXAM: CT HEAD WITHOUT CONTRAST TECHNIQUE: Contiguous axial images were obtained from the base of the skull through the vertex without intravenous contrast. RADIATION DOSE REDUCTION: This exam was performed according to the departmental dose-optimization program which includes automated exposure control, adjustment of the mA and/or kV according to patient size and/or use of iterative reconstruction technique. COMPARISON:  None. FINDINGS: Brain: No acute intracranial abnormality. Specifically, no hemorrhage, hydrocephalus, mass lesion, acute infarction, or significant intracranial injury. Vascular: No hyperdense vessel or unexpected calcification. Skull: No acute calvarial abnormality. Sinuses/Orbits: No acute findings Other: None IMPRESSION: Normal study. Electronically Signed   By: Lennette Bihari  [place]over M.D.   On: 07/20/2021 21:03   ? ?EKG: Independently reviewed.  Sinus tachycardia, no acute ST changes ? ?Assessment/Plan ?Principal Problem: ?  Headache ?Active Problems: ?  SIRS (systemic inflammatory response syndrome) (HCC) ?  Febrile illness ?  Fever ? (please populate well all problems here in Problem List. (For example, if patient is on BP meds at home and you resume or decide to hold them, it is a problem that needs to be her. Same for CAD, COPD, HLD and so on) ? ?SIRS ?-With fever and tachycardia ?-Meningitis ruled out, encephalitis unlikely. ?-Check chest x-ray, UA, blood cultures x2, ESR and CRP and procalcitonin. ?-Discussed with on-call infectious disease attending, recommend empirical coverage for doxycycline for  now. ?-May also consider chronic/recurrent inflammatory versus rheumatology problem.  Send SLE screening. ? ?Fever of unknown origin ?-As above.  No clear infection source at this point.  Noticed that patient a

## 2021-07-21 NOTE — ED Notes (Signed)
hospitalist ?  ?Claude Manges, PA-C ?07/21/21 0022 ? ?

## 2021-07-21 NOTE — Progress Notes (Signed)
Transferring facility: [facility] ?Requesting provider: Claude Manges, PA (EDP at [facility]) ?Reason for transfer: admission for further evaluation and management of sepsis, with concern for potential meningitis.  ? ?31 year old male with medical history notable for myocarditis, who presented to [facility] ED on 07/20/2021 complaining of headache over the course of the last day.  Notes that headache has been nearly constant over that timeframe and located in the occipital region.  He provides context that he rarely, if ever gets headaches, notes that he has never had a headache as bad as this.  He notes associated neck stiffness, which is also new for him.  Has attempted multiple prn doses of acetaminophen at home, but without any improvement in his headache.  No recent trauma.  Denies any acute respiratory complaints, no frank chest pain.  Denies any acute focal neurologic deficits, including no acute change in vision.  He notes a history of myocarditis approximately 1 year ago, prompting reported admission in [facility] system  at that time.  Most recent echocardiogram was reportedly performed approximately a month ago in March 2023. ? ?Vital signs in the ED were notable for the following: Temperature max 100.4; heart rate in the low 100s; normotensive; respiratory rate 17-25; oxygen saturations 96 to 100% on room air. ? ?Labs were notable for CBC, which showed white blood cell count of 5600.  Initial troponin 91.  Baseline/most recent prior troponin currently unclear to me as well as the results of the patient's troponin values 1 year ago when he was diagnosed with myocarditis.  ? ?Additionally, he underwent a successful LP at [facility] today, with CSF fluid analysis results currently pending. ? ?Imaging notable for noncontrast CT of the head, which reportedly showed no evidence of acute process. ? ?Medications administered prior to transfer included the following: IV vancomycin and meningitis dosing of  Rocephin.  Of note, no steroids administered prior to first doses of antibiotics.  He also received a dose of IV morphine.  also requested stat lactic acid.  ? ?Subsequently, I accepted this patient for transfer for inpatient admission to a PCU bed at [facility] or equivalent level of care at [facility] based upon first available bed, for further work-up and management of sepsis, with concern for underlying meningitis.  ? ? ? ? ?Check [email or web address] for on-call coverage. ?  ?Nursing staff, Please call [facility] Admits & Consults System-Wide number on Amion as soon as patient's arrival, so appropriate admitting provider can evaluate the pt. ? ? ? ? ?Newton Pigg, DO ?Hospitalist  ?

## 2021-07-22 ENCOUNTER — Ambulatory Visit (HOSPITAL_COMMUNITY)
Admit: 2021-07-22 | Discharge: 2021-07-22 | Disposition: A | Discharge status: Home / Self Care | Payer: BC Managed Care – PPO | Attending: Cardiovascular Disease | Admitting: Cardiovascular Disease

## 2021-07-22 ENCOUNTER — Inpatient Hospital Stay (HOSPITAL_COMMUNITY): Payer: BC Managed Care – PPO

## 2021-07-22 DIAGNOSIS — I3139 Other pericardial effusion (noninflammatory): Secondary | ICD-10-CM

## 2021-07-22 DIAGNOSIS — F1010 Alcohol abuse, uncomplicated: Secondary | ICD-10-CM | POA: Diagnosis not present

## 2021-07-22 DIAGNOSIS — I401 Isolated myocarditis: Secondary | ICD-10-CM | POA: Diagnosis not present

## 2021-07-22 DIAGNOSIS — I5023 Acute on chronic systolic (congestive) heart failure: Secondary | ICD-10-CM | POA: Insufficient documentation

## 2021-07-22 DIAGNOSIS — R519 Headache, unspecified: Secondary | ICD-10-CM | POA: Diagnosis not present

## 2021-07-22 DIAGNOSIS — R778 Other specified abnormalities of plasma proteins: Secondary | ICD-10-CM

## 2021-07-22 DIAGNOSIS — I319 Disease of pericardium, unspecified: Secondary | ICD-10-CM

## 2021-07-22 DIAGNOSIS — I5022 Chronic systolic (congestive) heart failure: Secondary | ICD-10-CM | POA: Diagnosis not present

## 2021-07-22 DIAGNOSIS — R6510 Systemic inflammatory response syndrome (SIRS) of non-infectious origin without acute organ dysfunction: Secondary | ICD-10-CM

## 2021-07-22 LAB — TROPONIN I (HIGH SENSITIVITY): Troponin I (High Sensitivity): 4892 ng/L (ref <18)

## 2021-07-22 LAB — CBC
HCT: 42.8 % (ref 39.0–52.0)
Hemoglobin: 14.2 g/dL (ref 13.0–17.0)
MCH: 29.4 pg (ref 26.0–34.0)
MCHC: 33.2 g/dL (ref 30.0–36.0)
MCV: 88.6 fL (ref 80.0–100.0)
Platelets: 129 10*3/uL — ABNORMAL LOW (ref 150–400)
RBC: 4.83 MIL/uL (ref 4.22–5.81)
RDW: 13 % (ref 11.5–15.5)
WBC: 12.6 10*3/uL — ABNORMAL HIGH (ref 4.0–10.5)
nRBC: 0 % (ref 0.0–0.2)

## 2021-07-22 LAB — DIFFERENTIAL
Abs Immature Granulocytes: 0.05 10*3/uL (ref 0.00–0.07)
Basophils Absolute: 0.1 10*3/uL (ref 0.0–0.1)
Basophils Relative: 0 %
Eosinophils Absolute: 0.1 10*3/uL (ref 0.0–0.5)
Eosinophils Relative: 1 %
Immature Granulocytes: 0 %
Lymphocytes Relative: 11 %
Lymphs Abs: 1.4 10*3/uL (ref 0.7–4.0)
Monocytes Absolute: 1.6 10*3/uL — ABNORMAL HIGH (ref 0.1–1.0)
Monocytes Relative: 13 %
Neutro Abs: 9.4 10*3/uL — ABNORMAL HIGH (ref 1.7–7.7)
Neutrophils Relative %: 75 %

## 2021-07-22 LAB — ROCKY MTN SPOTTED FVR ABS PNL(IGG+IGM)
RMSF IgG: NEGATIVE
RMSF IgM: 0.39 index (ref 0.00–0.89)

## 2021-07-22 LAB — PROCALCITONIN: Procalcitonin: <0.1 ng/mL

## 2021-07-22 LAB — ANTIEXTRACTABLE NUCLEAR AG
ENA SM Ab Ser-aCnc: <0.2 AI (ref 0.0–0.9)
Ribonucleic Protein: <0.2 AI (ref 0.0–0.9)

## 2021-07-22 LAB — CK: Total CK: 934 U/L — ABNORMAL HIGH (ref 49–397)

## 2021-07-22 LAB — LYME DISEASE SEROLOGY W/REFLEX: Lyme Total Antibody EIA: NEGATIVE

## 2021-07-22 IMAGING — DX DG CHEST 1V
1 series · 1 of 1 positions shown · non-contrast
Comparison: Yesterday

CLINICAL DATA: Follow-up of CHF

EXAM:
CHEST  1 VIEW

[chest ap]
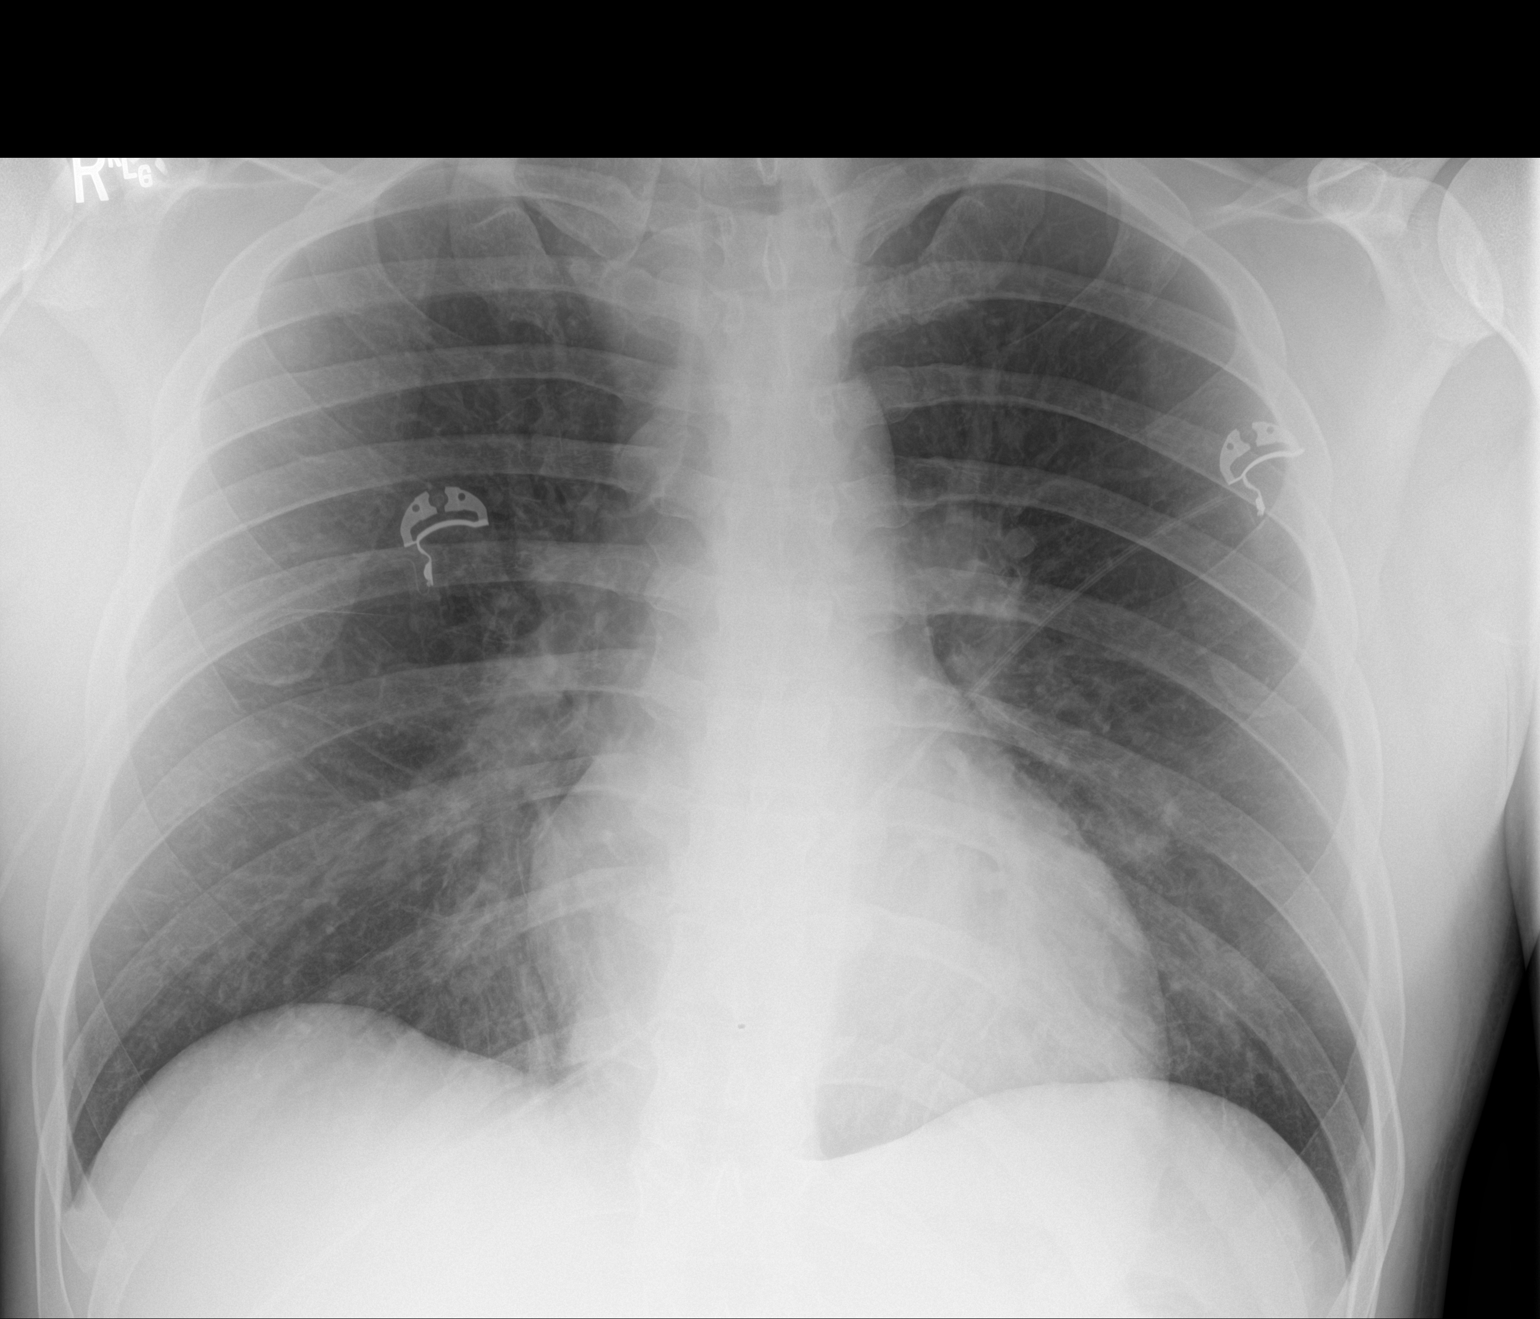

[1 of 1 positions shown; findings below may reference images not displayed]

FINDINGS: Numerous leads and wires project over the chest. Midline trachea.
Borderline cardiomegaly. No pleural effusion or pneumothorax.
Development of mild perihilar interstitial thickening. No lobar
consolidation.
IMPRESSION: Development of mild perihilar interstitial thickening which given
the clinical history likely represents pulmonary venous congestion.

## 2021-07-22 IMAGING — MR MR CARD MORPHOLOGY WO/W CM
45 of 48 series · 45 of 48 positions shown · IV contrast (gadavist)
Comparison: none

CLINICAL DATA: Myocarditis

EXAM:
CARDIAC MRI
TECHNIQUE: The patient was scanned on a 1.5 Tesla Siemens magnet. A dedicated
cardiac coil was used. Functional imaging was done using Fiesta
sequences. [DATE], and 4 chamber views were done to assess for RWMA's.
Modified BROOMFIELD rule using a short axis stack was used to
calculate an ejection fraction on a dedicated work station using
Circle software. The patient received 8 cc of Gadavist. After 10
minutes inversion recovery sequences were used to assess for
infiltration and scar tissue.
CONTRAST:  Gadavist

[Series 4: t2_haste_db_tra_bh · axial · 8.0mm · 1.41mm/px · 1 of 16 slices shown]
[im 1/16]
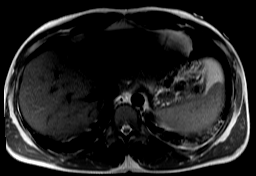

[Series 9: bSSFP · oblique · 8.0mm · 1.61mm/px · 1 of 25 slices shown (1 of 21)]
[im 1/25]
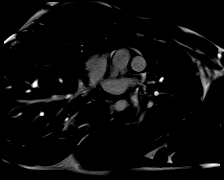

[Series 10: bSSFP · oblique · 8.0mm · 1.61mm/px · 1 of 25 slices shown (2 of 21)]
[im 1/25]
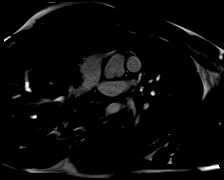

[Series 11: bSSFP · oblique · 8.0mm · 1.61mm/px · 1 of 25 slices shown (3 of 21)]
[im 1/25]
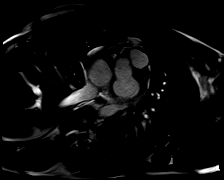

[Series 12: bSSFP · oblique · 8.0mm · 1.61mm/px · 1 of 25 slices shown (4 of 21)]
[im 1/25]
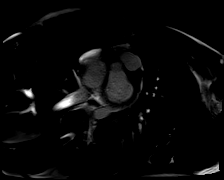

[Series 13: bSSFP · oblique · 8.0mm · 1.61mm/px · 1 of 25 slices shown (5 of 21)]
[im 1/25]
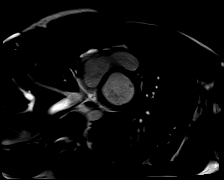

[Series 14: bSSFP · oblique · 8.0mm · 1.61mm/px · 1 of 25 slices shown (6 of 21)]
[im 1/25]
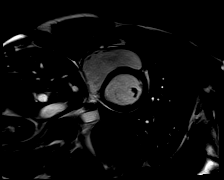

[Series 15: bSSFP · oblique · 8.0mm · 1.61mm/px · 1 of 25 slices shown (7 of 21)]
[im 1/25]
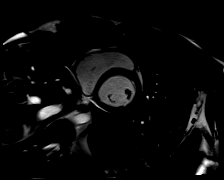

[Series 16: bSSFP · oblique · 8.0mm · 1.61mm/px · 1 of 25 slices shown (8 of 21)]
[im 1/25]
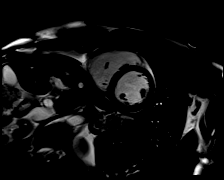

[Series 17: bSSFP · oblique · 8.0mm · 1.61mm/px · 1 of 25 slices shown (9 of 21)]
[im 1/25]
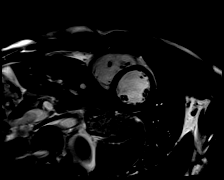

[Series 18: bSSFP · oblique · 8.0mm · 1.61mm/px · 1 of 25 slices shown (10 of 21)]
[im 1/25]
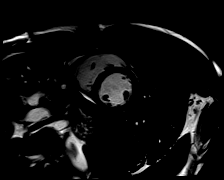

[Series 19: bSSFP · oblique · 8.0mm · 1.61mm/px · 1 of 25 slices shown (11 of 21)]
[im 1/25]
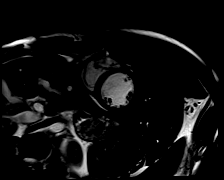

[Series 20: bSSFP · oblique · 8.0mm · 1.61mm/px · 1 of 25 slices shown (12 of 21)]
[im 1/25]
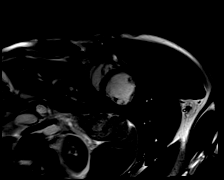

[Series 21: bSSFP · oblique · 8.0mm · 1.61mm/px · 1 of 25 slices shown (13 of 21)]
[im 1/25]
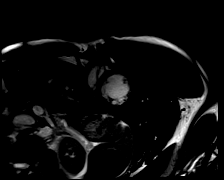

[Series 22: bSSFP · oblique · 8.0mm · 1.61mm/px · 1 of 25 slices shown (14 of 21)]
[im 1/25]
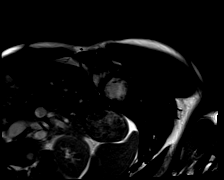

[Series 23: bSSFP · oblique · 8.0mm · 1.61mm/px · 1 of 25 slices shown (15 of 21)]
[im 1/25]
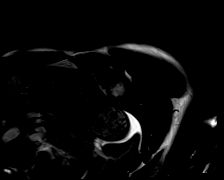

[Series 24: bSSFP · oblique · 8.0mm · 1.61mm/px · 1 of 25 slices shown (16 of 21)]
[im 1/25]
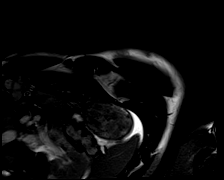

[Series 25: bSSFP · oblique · 8.0mm · 1.61mm/px · 1 of 25 slices shown (17 of 21)]
[im 1/25]
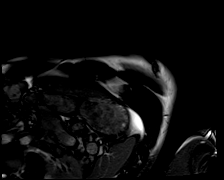

[Series 26: bSSFP · oblique · 8.0mm · 1.61mm/px · 1 of 25 slices shown (18 of 21)]
[im 1/25]
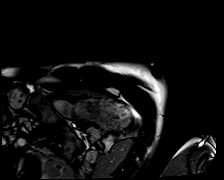

[Series 27: bSSFP · oblique · 6.0mm · 1.41mm/px · 1 of 25 slices shown (19 of 21)]
[im 1/25]
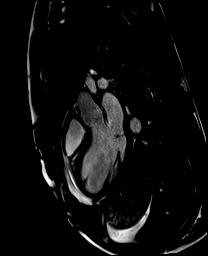

[Series 28: bSSFP · oblique · 6.0mm · 1.41mm/px · 1 of 25 slices shown (20 of 21)]
[im 1/25]
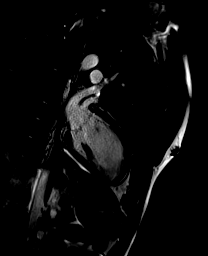

[Series 29: bSSFP · oblique · 6.0mm · 1.41mm/px · 1 of 25 slices shown (21 of 21)]
[im 1/25]
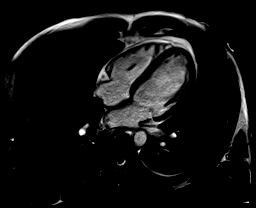

[Series 30: (id)_long_t1 · oblique · 8.0mm · 1.56mm/px · 1 of 24 slices shown]
[im 1/24]
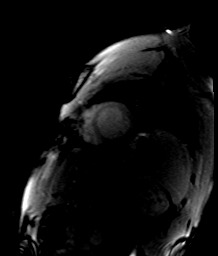

[Series 31: (id)_long_t1_moco · oblique · 8.0mm · 1.56mm/px · 1 of 24 slices shown]
[im 1/24]
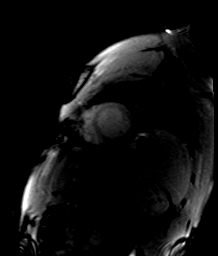

[Series 32: (id)_long_t1_moco_t1 · oblique · 8.0mm · 1.56mm/px · 1 of 6 slices shown]
[im 1/6]
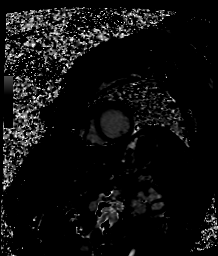

[Series 34: (id)_trufi · oblique · 8.0mm · 2.08mm/px · 1 of 9 slices shown]
[im 1/9]
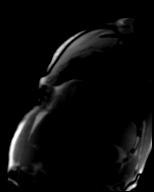

[Series 35: (id)_trufi_moco · oblique · 8.0mm · 2.08mm/px · 1 of 9 slices shown]
[im 1/9]
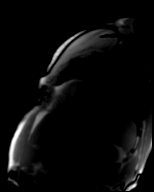

[Series 36: (id)_trufi_moco_t2 · oblique · 8.0mm · 2.08mm/px · 1 of 3 slices shown]
[im 1/3]
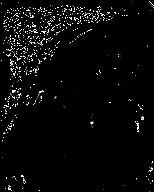

[Series 41: cine_trufi_short axis_cs_2_shot · oblique · 8.0mm · 1.48mm/px · 1 of 25 slices shown (1 of 17)]
[im 1/25]
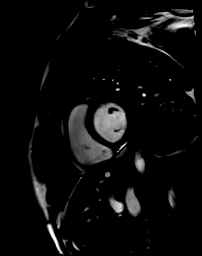

[Series 41: cine_trufi_short axis_cs_2_shot · oblique · 8.0mm · 1.48mm/px · 1 of 25 slices shown (2 of 17)]
[im 1/25]
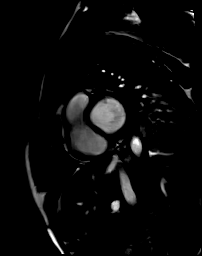

[Series 41: cine_trufi_short axis_cs_2_shot · oblique · 8.0mm · 1.48mm/px · 1 of 25 slices shown (3 of 17)]
[im 1/25]
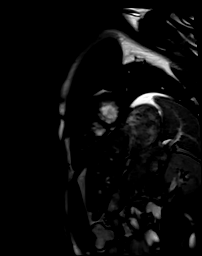

[Series 41: cine_trufi_short axis_cs_2_shot · oblique · 8.0mm · 1.48mm/px · 1 of 25 slices shown (4 of 17)]
[im 1/25]
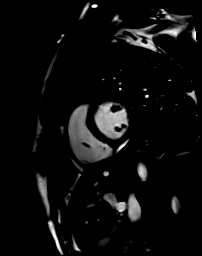

[Series 41: cine_trufi_short axis_cs_2_shot · oblique · 8.0mm · 1.48mm/px · 1 of 25 slices shown (5 of 17)]
[im 1/25]
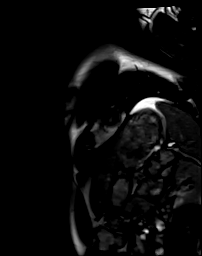

[Series 41: cine_trufi_short axis_cs_2_shot · oblique · 8.0mm · 1.48mm/px · 1 of 25 slices shown (6 of 17)]
[im 1/25]
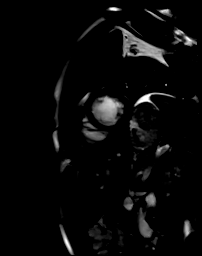

[Series 41: cine_trufi_short axis_cs_2_shot · oblique · 8.0mm · 1.48mm/px · 1 of 25 slices shown (7 of 17)]
[im 1/25]
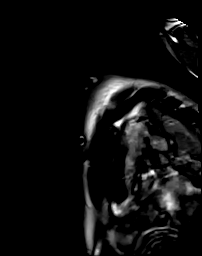

[Series 41: cine_trufi_short axis_cs_2_shot · oblique · 8.0mm · 1.48mm/px · 1 of 25 slices shown (8 of 17)]
[im 1/25]
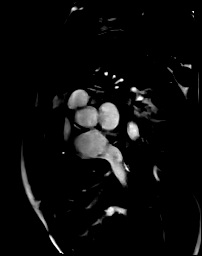

[Series 41: cine_trufi_short axis_cs_2_shot · oblique · 8.0mm · 1.48mm/px · 1 of 25 slices shown (9 of 17)]
[im 1/25]
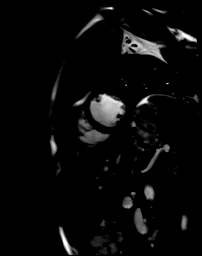

[Series 41: cine_trufi_short axis_cs_2_shot · oblique · 8.0mm · 1.48mm/px · 1 of 25 slices shown (10 of 17)]
[im 1/25]
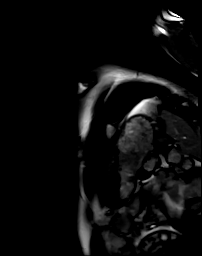

[Series 41: cine_trufi_short axis_cs_2_shot · oblique · 8.0mm · 1.48mm/px · 1 of 25 slices shown (11 of 17)]
[im 1/25]
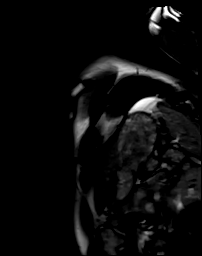

[Series 41: cine_trufi_short axis_cs_2_shot · oblique · 8.0mm · 1.48mm/px · 1 of 25 slices shown (12 of 17)]
[im 1/25]
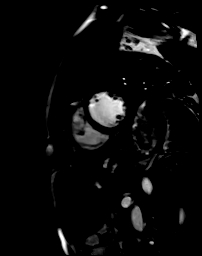

[Series 41: cine_trufi_short axis_cs_2_shot · oblique · 8.0mm · 1.48mm/px · 1 of 25 slices shown (13 of 17)]
[im 1/25]
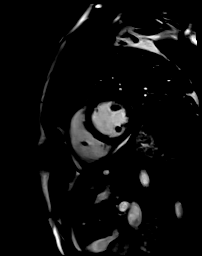

[Series 41: cine_trufi_short axis_cs_2_shot · oblique · 8.0mm · 1.48mm/px · 1 of 25 slices shown (14 of 17)]
[im 1/25]
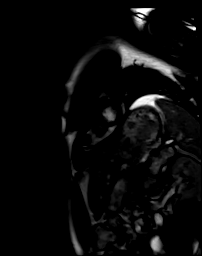

[Series 41: cine_trufi_short axis_cs_2_shot · oblique · 8.0mm · 1.48mm/px · 1 of 25 slices shown (15 of 17)]
[im 1/25]
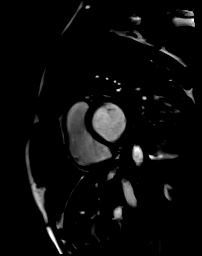

[Series 41: cine_trufi_short axis_cs_2_shot · oblique · 8.0mm · 1.48mm/px · 1 of 25 slices shown (16 of 17)]
[im 1/25]
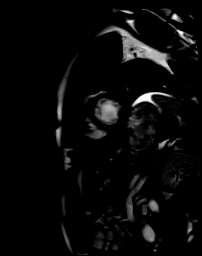

[Series 41: cine_trufi_short axis_cs_2_shot · oblique · 8.0mm · 1.48mm/px · 1 of 25 slices shown (17 of 17)]
[im 1/25]
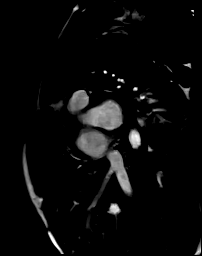

[45 of 48 positions shown; findings below may reference images not displayed]

FINDINGS: Normal atrial sizes. No PFO / ASD. Normal ascending thoracic aorta
2.9 cm Normal appearing cardiac valves Non rheumatic mild appearing
MR BROOMFIELD but nearly circumferential pericardial effusion Gadolinium
uptake along the RV/LV free wall pericardium as well as lateral
apical LV myocardial wall and septum

T1 native [N6] msec elevated

ECV: Elevated 46%

T2 55 msec

Mild LVE with global hypokinesis Quantitative EF 48% (EDV 194 cc ESV
101 cc SV 93 cc )

Mild RVE with global hypokinesis Quantitative EF 42% (EDV 157 cc ESV
92 cc SV 65 cc )
IMPRESSION: 1. Mild LVE global hypokinesis EF 48%

2.  Mild RVE global hypokinesis EF 42%

3. Gadolinium uptake in RV/LV free wall pericardium and septal,
apical lateral myocardium Despite normal T2 and normal ESR on labs
(CRP is elevated) Findings consistent with recurrent myopericarditis

4.  Elevated native T1 and ECV

5.  Small but nearly circumferential pericardial effusion

BROOMFIELD

## 2021-07-22 MED ORDER — SACCHAROMYCES BOULARDII 250 MG PO CAPS
250.0000 mg | ORAL_CAPSULE | Freq: Two times a day (BID) | ORAL | Status: DC
  Administered 2021-07-22 – 2021-07-25 (×6): 250 mg via ORAL
  Filled 2021-07-22 (×6): qty 1

## 2021-07-22 MED ORDER — GADOBUTROL 1 MMOL/ML IV SOLN
8.0000 mL | Freq: Once | INTRAVENOUS | Status: AC | PRN
  Administered 2021-07-22: 8 mL via INTRAVENOUS

## 2021-07-22 MED ORDER — SACUBITRIL-VALSARTAN 24-26 MG PO TABS
1.0000 | ORAL_TABLET | Freq: Two times a day (BID) | ORAL | Status: DC
  Administered 2021-07-22 – 2021-07-25 (×6): 1 via ORAL
  Filled 2021-07-22 (×6): qty 1

## 2021-07-22 NOTE — Assessment & Plan Note (Addendum)
Patient returned from [place] 2 weeks prior to admission.  Infectious work-up including blood culture, CSF culture, HIV, Lyme, RMSF, malaria, COVID-19, RVP, Zika virus IgM and influenza negative.  Procalcitonin persistently negative arguing against bacterial infection.  Antibiotics discontinued by ID.  No further fever.  Cleared for discharge by ID ?-Follow enterovirus and dengue labs. ?

## 2021-07-22 NOTE — Assessment & Plan Note (Deleted)
Bacterial meningitis unlikely but cannot rule out a septic meningitis.  Fortunately, headache improved.  He could also have a headache from lumbar puncture. ?-Continue Fioricet ?-Also on ibuprofen for myopericarditis. ? ?

## 2021-07-22 NOTE — Hospital Course (Addendum)
31 year old M with PMH of systolic CHF in the setting of myopericarditis in 11/2020 with recovered EF and alcohol abuse presenting with fever, chills, severe headache, neck pain, nausea and intermittent chest pain.  Started on broad-spectrum antibiotics with concern for meningitis.   He had LP with sterile CSF.  Infectious disease consulted.   ? ?Patient also has elevated troponin rising from [phone] in the setting of myopericarditis. TTE with LVEF of 30 to 35%, RWMA.  He underwent cardiac MRI that showed mild LV global hypokinesis with EF of 48%, mild RV global hypokinesis with EF of 42% and gadolinium uptake concerning for myopericarditis and a small but nearly circumferential pericardial effusion.  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 Coxsackie A IgG positive with low titer but IgM negative. He is normal ESR, negative ANA and slightly elevated CRP argues against autoimmune or chronic inflammatory process.  Patient remains on ibuprofen and colchicine per cardiology.  Also started on Entresto for acute systolic CHF.  ? ?From infectious standpoint, work-up including blood culture, CSF culture, HIV, Lyme, RMSF, Zika virus IgM, RVP, malaria, COVID-19 and influenza negative.  CSF enteroviral studies and dengue pending.  ? ?Patient had intractable headache concerning for post dural puncture headache that has resolved with blood patch.  ? ?Patient was cleared for discharge by cardiology and ID.  He is discharged on colchicine, Advil and Entresto per cardiology.  Outpatient follow-up with cardiology on 08/03/2021. ?

## 2021-07-22 NOTE — Consult Note (Signed)
CARDIOLOGY CONSULT NOTE  ? ? ? ? ? ?Patient ID: ?Abe Peopleurtis B Kamer ?MRN: [id number] ?DOB/AGE: 1990/04/26 31 y.o. ? ?Admit date: 07/20/2021 ?Referring Physician: Chipper HerbZhang ?Primary Physician: Pcp, No ?Primary Cardiologist: Hilty ?Reason for Consultation: Myocarditis ? ?Principal Problem: ?  Headache ?Active Problems: ?  SIRS (systemic inflammatory response syndrome) (HCC) ?  Febrile illness ?  Myocarditis (HCC) ?  Fever ? ? ?HPI:  31 y.o. admitted with malaise , headache and some chest pain Just returned from [place] trip to [place] Noted to have elevated troponin now increased to [phone] this am. Previous history of myocarditis in September 2022 At that time troponin peaked at 9714.  TTE had normal EF and MRI with no real gadolinium uptake which is unusual. He suspects he did have mosquito bites while traveling He had negative LP on admission for meningitis but still has a bad headache He is non toxic appearing with normal WBC and currently no fever. Telemetry with no arrhythmias ID suggested a course of oral doxycycline  ? ?ROS ?All other systems reviewed and negative except as noted above ? ?Past Medical History:  ?Diagnosis Date  ? Myocarditis (HCC) 11/2020  ? Myopericarditis 11/2020  ?  ?History reviewed. No pertinent family history.  ?Social History  ? ?Socioeconomic History  ? Marital status: Unknown  ?  Spouse name: Not on file  ? Number of children: Not on file  ? Years of education: Not on file  ? Highest education level: Not on file  ?Occupational History  ? Not on file  ?Tobacco Use  ? Smoking status: Some Days  ?  Types: Cigarettes  ? Smokeless tobacco: Never  ?Substance and Sexual Activity  ? Alcohol use: Yes  ?  Comment: 4-12 oz nightly  ? Drug use: Never  ? Sexual activity: Not on file  ?Other Topics Concern  ? Not on file  ?Social History Narrative  ? Not on file  ? ?Social Determinants of Health  ? ?Financial Resource Strain: Not on file  ?Food Insecurity: Not on file  ?Transportation Needs: Not on  file  ?Physical Activity: Not on file  ?Stress: Not on file  ?Social Connections: Not on file  ?Intimate Partner Violence: Not on file  ?  ?History reviewed. No pertinent surgical history.  ? ? ?Current Facility-Administered Medications:  ?  butalbital-acetaminophen-caffeine (FIORICET) 50-325-40 MG per tablet 1 tablet, 1 tablet, Oral, Q6H PRN, Emeline GeneralZhang, Ping T, MD, 1 tablet at 07/22/21 0732 ?  colchicine tablet 0.6 mg, 0.6 mg, Oral, BID, Mikey CollegeZhang, Ping T, MD, 0.6 mg at 07/21/21 2154 ?  doxycycline (VIBRAMYCIN) 100 mg in sodium chloride 0.9 % 250 mL IVPB, 100 mg, Intravenous, BID, Judyann MunsonSnider, Cynthia, MD, Last Rate: 125 mL/hr at 07/21/21 2154, 100 mg at 07/21/21 2154 ?  HYDROmorphone (DILAUDID) injection 0.5 mg, 0.5 mg, Intravenous, Q4H PRN, Mikey CollegeZhang, Ping T, MD, 0.5 mg at 07/22/21 0422 ?  ibuprofen (ADVIL) tablet 800 mg, 800 mg, Oral, TID, Mikey CollegeZhang, Ping T, MD, 800 mg at 07/21/21 2154 ?  lidocaine (LIDODERM) 5 % 1 patch, 1 patch, Transdermal, Q24H, Emeline GeneralZhang, Ping T, MD, 1 patch at 07/21/21 1337 ?  ondansetron (ZOFRAN) tablet 4 mg, 4 mg, Oral, Q6H PRN **OR** ondansetron (ZOFRAN) injection 4 mg, 4 mg, Intravenous, Q6H PRN, Mikey CollegeZhang, Ping T, MD ? colchicine  0.6 mg Oral BID  ? ibuprofen  800 mg Oral TID  ? lidocaine  1 patch Transdermal Q24H  ? ? doxycycline (VIBRAMYCIN) IV 100 mg (07/21/21 2154)  ? ? ?Physical  Exam: ?Blood pressure 113/75, pulse 90, temperature 98 ?F (36.7 ?C), temperature source Oral, resp. rate 19, height 5\' 11"  (1.803 m), weight 86.2 kg, SpO2 94 %.   ? ?Affect appropriate ?Healthy:  appears stated age ?HEENT: normal ?Neck supple with no adenopathy ?JVP normal no bruits no thyromegaly ?Lungs clear with no wheezing and good diaphragmatic motion ?Heart:  S1/S2 no murmur, no rub, gallop or click ?PMI normal ?Abdomen: benighn, BS positve, no tenderness, no AAA ?no bruit.  No HSM or HJR ?Distal pulses intact with no bruits ?No edema ?Neuro non-focal ?Skin warm and dry ?No muscular weakness ? ? ?Labs: ?  ?Lab Results   ?Component Value Date  ? WBC 12.6 (H) 07/22/2021  ? HGB 14.2 07/22/2021  ? HCT 42.8 07/22/2021  ? MCV 88.6 07/22/2021  ? PLT 129 (L) 07/22/2021  ?  ?Recent Labs  ?Lab 07/20/21 ?2045  ?NA 138  ?K 3.8  ?CL 103  ?CO2 29  ?BUN 16  ?CREATININE 1.12  ?CALCIUM 9.1  ?PROT 7.3  ?BILITOT 0.4  ?ALKPHOS 49  ?ALT 35  ?AST 37  ?GLUCOSE 98  ? ?Lab Results  ?Component Value Date  ? CKTOTAL 934 (H) 07/22/2021  ? No results found for: CHOL ?No results found for: HDL ?No results found for: LDLCALC ?No results found for: TRIG ?No results found for: CHOLHDL ?No results found for: LDLDIRECT  ?  ?Radiology: ?DG Chest 1 View ? ?Result Date: 07/21/2021 ?CLINICAL DATA:  CHF. EXAM: CHEST  1 VIEW COMPARISON:  Frontal and lateral views earlier today FINDINGS: The heart is normal in size.The cardiomediastinal contours are normal. The lungs are clear. Pulmonary vasculature is normal. No consolidation, pleural effusion, or pneumothorax. No acute osseous abnormalities are seen. IMPRESSION: Negative portable AP view of the chest. Electronically Signed   By: 07/23/2021 M.D.   On: 07/21/2021 17:54  ? ?DG Chest 2 View ? ?Result Date: 07/21/2021 ?CLINICAL DATA:  Chest pain/shortness of breath EXAM: CHEST - 2 VIEW COMPARISON:  Chest x-ray dated December 13, 2020 FINDINGS: The heart size and mediastinal contours are within normal limits. Both lungs are clear. The visualized skeletal structures are unremarkable. IMPRESSION: No active cardiopulmonary disease. Electronically Signed   By: December 15, 2020 M.D.   On: 07/21/2021 15:30  ? ?CT Head Wo Contrast ? ?Result Date: 07/20/2021 ?CLINICAL DATA:  Meningitis/CNS infection suspected. Stiff neck, chills, headache EXAM: CT HEAD WITHOUT CONTRAST TECHNIQUE: Contiguous axial images were obtained from the base of the skull through the vertex without intravenous contrast. RADIATION DOSE REDUCTION: This exam was performed according to the departmental dose-optimization program which includes automated exposure  control, adjustment of the mA and/or kV according to patient size and/or use of iterative reconstruction technique. COMPARISON:  None. FINDINGS: Brain: No acute intracranial abnormality. Specifically, no hemorrhage, hydrocephalus, mass lesion, acute infarction, or significant intracranial injury. Vascular: No hyperdense vessel or unexpected calcification. Skull: No acute calvarial abnormality. Sinuses/Orbits: No acute findings Other: None IMPRESSION: Normal study. Electronically Signed   By: 07/22/2021 M.D.   On: 07/20/2021 21:03  ? ?ECHOCARDIOGRAM COMPLETE ? ?Result Date: 07/21/2021 ?   ECHOCARDIOGRAM REPORT   Patient Name:   CAPONE SCHWINN Date of Exam: 07/21/2021 Medical Rec #:  07/23/2021          Height:       71.0 in Accession #:    [id number]         Weight:       190.0 lb Date of Birth:  [birth year]  BSA:          2.063 m? Patient Age:    30 years           BP:           104/79 mmHg Patient Gender: M                  HR:           85 bpm. Exam Location:  Inpatient Procedure: 2D Echo Indications:    Elevated troponin  History:        Patient has prior history of Echocardiogram examinations, most                 recent 06/22/2021.  Sonographer:    Devonne Doughty Referring Phys: [id number] Emeline General IMPRESSIONS  1. Left ventricular ejection fraction, by estimation, is 30 to 35%. The left ventricle has moderately decreased function. The left ventricle demonstrates regional wall motion abnormalities (see scoring diagram/findings for description). There is mild left ventricular hypertrophy. Left ventricular diastolic parameters were normal.  2. Right ventricular systolic function is normal. The right ventricular size is normal. There is normal pulmonary artery systolic pressure. The estimated right ventricular systolic pressure is 15.4 mmHg.  3. The mitral valve is normal in structure. Trivial mitral valve regurgitation. No evidence of mitral stenosis.  4. The aortic valve is tricuspid. Aortic valve  regurgitation is not visualized. No aortic stenosis is present. Comparison(s): Prior images reviewed side by side. Lv function is worse from prior. FINDINGS  Left Ventricle: Left ventricular ejection fraction, by est

## 2021-07-22 NOTE — Assessment & Plan Note (Deleted)
Unclear source of infection at this time. ?-Continue doxycycline per ID ?-Follow other infectious work-up/labs ?

## 2021-07-22 NOTE — Progress Notes (Signed)
?  [facility]   ? ?Date of Admission:  07/20/2021   Total days of antibiotics 3 ?        ? ? ?ID: COLUM COLT is a 31 y.o. 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  Headache ?Active Problems: ?  SIRS (systemic inflammatory response syndrome) (HCC) ?  Febrile illness ?  Myopericarditis ?  Elevated troponin ?  Alcohol abuse ?  Chronic systolic CHF ? ? ? ?Subjective: ?Afebrile, headache improved but requiring medications. Did have severe chest pain and had repeat cMRI which found myopericarditis and small pericardial effusion. ? ?Medications:  ? colchicine  0.6 mg Oral BID  ? ibuprofen  800 mg Oral TID  ? lidocaine  1 patch Transdermal Q24H  ? saccharomyces boulardii  250 mg Oral BID  ? ? ?Objective: ?Vital signs in last 24 hours: ?Temp:  [97.9 ?F (36.6 ?C)-99.2 ?F (37.3 ?C)] 98.3 ?F (36.8 ?C) (04/26 1423) ?Pulse Rate:  [63-90] 80 (04/26 1423) ?Resp:  [16-19] 18 (04/26 1423) ?BP: (94-113)/(68-78) 108/68 (04/26 1423) ?SpO2:  [94 %-100 %] 99 % (04/26 1423) ? ?Physical Exam  ?Constitutional: He is oriented to person, place, and time. He appears well-developed and well-nourished. No distress.  ?HENT:  ?Mouth/Throat: Oropharynx is clear and moist. No oropharyngeal exudate.  ?Neurological: He is alert and oriented to person, place, and time.  ?Skin: Skin is warm and dry. No rash noted. No erythema.  ?Psychiatric: He has a normal mood and affect. His behavior is normal.  ? ? ?Lab Results ?Recent Labs  ?  07/20/21 ?2045 07/22/21 ?0502  ?WBC 5.6 12.6*  ?HGB 15.1 14.2  ?HCT 45.2 42.8  ?NA 138  --   ?K 3.8  --   ?CL 103  --   ?CO2 29  --   ?BUN 16  --   ?CREATININE 1.12  --   ? ?Liver Panel ?Recent Labs  ?  07/20/21 ?2045  ?PROT 7.3  ?ALBUMIN 4.1  ?AST 37  ?ALT 35  ?ALKPHOS 49  ?BILITOT 0.4  ? ?Sedimentation Rate ?Recent Labs  ?  07/21/21 ?1128  ?ESRSEDRATE 8  ? ?C-Reactive Protein ?Recent Labs  ?   07/21/21 ?1128  ?CRP 4.5*  ? ? ?Microbiology: ?Csf 0 wbc, protein 33, and glu 66,  ?Csf cx NGTD ?Blood cx NGTD ?Covid negative ?Hiv negative ?Malarial smear negative ?Lyme negative ?Studies/Results: ?DG Chest 1 View ? ?Result Date: 07/22/2021 ?CLINICAL DATA:  Follow-up of CHF EXAM: CHEST  1 VIEW COMPARISON:  Yesterday FINDINGS: Numerous leads and wires project over the chest. Midline trachea. Borderline cardiomegaly. No pleural effusion or pneumothorax. Development of mild perihilar interstitial thickening. No lobar consolidation. IMPRESSION: Development of mild perihilar interstitial thickening which given the clinical history likely represents pulmonary venous congestion. Electronically Signed   By: Abigail Miyamoto M.D.   On: 07/22/2021 08:10  ? ?DG Chest 1 View ? ?Result Date: 07/21/2021 ?CLINICAL DATA:  CHF. EXAM: CHEST  1 VIEW COMPARISON:  Frontal and lateral views earlier today FINDINGS: The heart is normal in size.The cardiomediastinal contours are normal. The lungs are clear. Pulmonary vasculature is normal. No consolidation, pleural effusion, or pneumothorax. No acute osseous abnormalities are seen. IMPRESSION: Negative portable AP view of the chest. Electronically Signed   By: Keith Rake M.D.   On: 07/21/2021 17:54  ? ?DG Chest 2 View ? ?Result Date: 07/21/2021 ?CLINICAL DATA:  Chest pain/shortness of  breath EXAM: CHEST - 2 VIEW COMPARISON:  Chest x-ray dated December 13, 2020 FINDINGS: The heart size and mediastinal contours are within normal limits. Both lungs are clear. The visualized skeletal structures are unremarkable. IMPRESSION: No active cardiopulmonary disease. Electronically Signed   By: Yetta Glassman M.D.   On: 07/21/2021 15:30  ? ?CT Head Wo Contrast ? ?Result Date: 07/20/2021 ?CLINICAL DATA:  Meningitis/CNS infection suspected. Stiff neck, chills, headache EXAM: CT HEAD WITHOUT CONTRAST TECHNIQUE: Contiguous axial images were obtained from the base of the skull through the vertex without  intravenous contrast. RADIATION DOSE REDUCTION: This exam was performed according to the departmental dose-optimization program which includes automated exposure control, adjustment of the mA and/or kV according to patient size and/or use of iterative reconstruction technique. COMPARISON:  None. FINDINGS: Brain: No acute intracranial abnormality. Specifically, no hemorrhage, hydrocephalus, mass lesion, acute infarction, or significant intracranial injury. Vascular: No hyperdense vessel or unexpected calcification. Skull: No acute calvarial abnormality. Sinuses/Orbits: No acute findings Other: None IMPRESSION: Normal study. Electronically Signed   By: Rolm Baptise M.D.   On: 07/20/2021 21:03  ? ?MR CARDIAC MORPHOLOGY W WO CONTRAST ? ?Result Date: 07/22/2021 ?CLINICAL DATA:  Myocarditis EXAM: CARDIAC MRI TECHNIQUE: The patient was scanned on a 1.5 Tesla Siemens magnet. A dedicated cardiac coil was used. Functional imaging was done using Fiesta sequences. 2,3, and 4 chamber views were done to assess for RWMA's. Modified Simpson's rule using a short axis stack was used to calculate an ejection fraction on a dedicated work Conservation officer, nature. The patient received 8 cc of Gadavist. After 10 minutes inversion recovery sequences were used to assess for infiltration and scar tissue. CONTRAST:  Gadavist FINDINGS: Normal atrial sizes. No PFO / ASD. Normal ascending thoracic aorta 2.9 cm Normal appearing cardiac valves Non rheumatic mild appearing MR Small but nearly circumferential pericardial effusion Gadolinium uptake along the RV/LV free wall pericardium as well as lateral apical LV myocardial wall and septum T1 native 1111 msec elevated ECV: Elevated 46% T2 55 msec Mild LVE with global hypokinesis Quantitative EF 48% (EDV 194 cc ESV 101 cc SV 93 cc ) Mild RVE with global hypokinesis Quantitative EF 42% (EDV 157 cc ESV 92 cc SV 65 cc ) IMPRESSION: 1. Mild LVE global hypokinesis EF 48% 2.  Mild RVE global  hypokinesis EF 42% 3. Gadolinium uptake in RV/LV free wall pericardium and septal, apical lateral myocardium Despite normal T2 and normal ESR on labs (CRP is elevated) Findings consistent with recurrent myopericarditis 4.  Elevated native T1 and ECV 5.  Small but nearly circumferential pericardial effusion [place] Electronically Signed   By: [place] Rouge M.D.   On: 07/22/2021 14:34  ? ?ECHOCARDIOGRAM COMPLETE ? ?Result Date: 07/21/2021 ?   ECHOCARDIOGRAM REPORT   Patient Name:   AMEDEE CERRONE Date of Exam: 07/21/2021 Medical Rec #:  [id number]          Height:       71.0 in Accession #:    [id number]         Weight:       190.0 lb Date of Birth:  07/18/1990          BSA:          2.063 m? Patient Age:    30 years           BP:           104/79 mmHg Patient Gender: M  HR:           85 bpm. Exam Location:  Inpatient Procedure: 2D Echo Indications:    Elevated troponin  History:        Patient has prior history of Echocardiogram examinations, most                 recent 06/22/2021.  Sonographer:    Arlyss Gandy Referring Phys: [id number] [place]  1. Left ventricular ejection fraction, by estimation, is 30 to 35%. The left ventricle has moderately decreased function. The left ventricle demonstrates regional wall motion abnormalities (see scoring diagram/findings for description). There is mild left ventricular hypertrophy. Left ventricular diastolic parameters were normal.  2. Right ventricular systolic function is normal. The right ventricular size is normal. There is normal pulmonary artery systolic pressure. The estimated right ventricular systolic pressure is 33.5 mmHg.  3. The mitral valve is normal in structure. Trivial mitral valve regurgitation. No evidence of mitral stenosis.  4. The aortic valve is tricuspid. Aortic valve regurgitation is not visualized. No aortic stenosis is present. Comparison(s): Prior images reviewed side by side. Lv function is worse from prior.  FINDINGS  Left Ventricle: Left ventricular ejection fraction, by estimation, is 30 to 35%. The left ventricle has moderately decreased function. The left ventricle demonstrates regional wall motion abnormalities. The le

## 2021-07-22 NOTE — Plan of Care (Signed)

## 2021-07-22 NOTE — Assessment & Plan Note (Addendum)
Likely from myopericarditis versus ACS.  Troponin peaked at 4900 and started trending down.  TTE and MRI as above. ?-Cardiac meds as above ?

## 2021-07-22 NOTE — Assessment & Plan Note (Addendum)
Patient admits to intermittent binging.  Last drink was on Friday.  No withdrawal symptoms. ?-Encouraged alcohol cessation given his underlying cardiac history. ?

## 2021-07-22 NOTE — Progress Notes (Signed)
?   07/22/21 [id number]  ?Provider Notification  ?Provider Name/Title Gershon Cull, NP  ?Date Provider Notified 07/22/21  ?Time Provider Notified [phone]  ?Method of Notification Page  ?Notification Reason Critical result  ?Test performed and critical result Troponin 4892  ?Date Critical Result Received 07/22/21  ?Time Critical Result Received [phone]  ?Provider response No new orders  ?Date of Provider Response 07/22/21  ?Time of Provider Response [phone]  ? ? ?

## 2021-07-22 NOTE — Assessment & Plan Note (Addendum)
Unclear etiology but suspect acute viral illness but seems to be recurrent issue.  Coxsackie a IgG positive but IgM negative suggesting remote infection.  Normal ESR with negative ANA argues against chronic inflammatory or autoimmune process.  He has mildly elevated CRP suggesting acute inflammatory process.  TTE and cardiac MRI as above.  No significant cardiac history in immediate familys. ?-Discharged on colchicine, ibuprofen and Entresto per cardiology. ?-Discussed about alcohol cessation ?-Outpatient follow-up with his cardiologist on 08/03/2021 ?

## 2021-07-22 NOTE — Assessment & Plan Note (Addendum)
Patient with reduced EF in the setting of myopericarditis in 11/2020.  Recovered EF based on TTE on 06/22/2021.  Now with reduced EF on current TTE in the setting of myopericarditis.  Overall, he looks compensated and euvolemic.  Not on diuretics. ?-Discharged on Entresto for outpatient follow-up. ?-Encouraged alcohol cessation ?-Advised the importance of sodium and fluid restriction ?

## 2021-07-22 NOTE — Progress Notes (Signed)
?PROGRESS NOTE ? ?Dale Macias AES:[id number] DOB: 1990-04-04  ? ?PCP: Pcp, No ? ?Patient is from: Home ? ?DOA: 07/20/2021 LOS: 1 ? ?Chief complaints ?Chief Complaint  ?Patient presents with  ? Torticollis  ? Headache  ?  ? ?Brief Narrative / Interim history: ?31 year old M with PMH of systolic CHF in the setting of myopericarditis in 11/2020 with recovered EF and alcohol abuse presenting with fever, chills, severe headache, neck pain, nausea and intermittent chest pain.  Started on broad-spectrum antibiotics with concern for meningitis.   He had LP with sterile CSF.  Infectious disease consulted and ordered speciality labs given recent return from [place] 2 weeks prior.  He also has elevated troponin.  Cardiology consulted and started colchicine and NSAID.  TTE with LVEF of 30 to 35%, RWMA.  He underwent cardiac MRI that showed mild LV global hypokinesis with EF of 48%, mild RV global hypokinesis with EF of 42% and gadolinium uptake concerning for myopericarditis and a small but nearly circumferential pericardial effusion.  ? ?Subjective: ?Seen and examined earlier this morning.  No major events overnight of this morning.  He had significant headache earlier in the morning.  He says his headache improved after IV Dilaudid.  He rates his pain 3/10 at the moment.  Pain is mainly over occipital area and adjacent part of his neck and shoulder.  No nausea, vomiting or photophobia.  Currently chest pain-free.  He denies dyspnea.  Denies GI symptoms.  ? ?Objective: ?Vitals:  ? 07/21/21 2112 07/22/21 0000 07/22/21 0428 07/22/21 1423  ?BP: 101/78 94/78 113/75 108/68  ?Pulse: 74 63 90 80  ?Resp: 16 18 19 18   ?Temp: 98.2 ?F (36.8 ?C) 97.9 ?F (36.6 ?C) 98 ?F (36.7 ?C) 98.3 ?F (36.8 ?C)  ?TempSrc: Oral Oral Oral Oral  ?SpO2: 100% 100% 94% 99%  ?Weight:      ?Height:      ? ? ?Examination: ? ?GENERAL: No apparent distress.  Nontoxic. ?HEENT: MMM.  Vision and hearing grossly intact.  ?NECK: Supple.  No apparent JVD.   ?RESP:  No IWOB.  Fair aeration bilaterally. ?CVS:  RRR. Heart sounds normal.  ?ABD/GI/GU: BS+. Abd soft, NTND.  ?MSK/EXT:  Moves extremities. No apparent deformity. No edema.  ?SKIN: no apparent skin lesion or wound ?NEURO: Awake, alert and oriented appropriately.  No apparent focal neuro deficit. ?PSYCH: Calm. Normal affect.  ? ?Procedures:  ?4/25-lumbar puncture ? ?Microbiology summarized: ?COVID-19 and influenza PCR nonreactive. ?Blood cultures NGTD. ?CSF culture NGTD. ? ?Assessment and Plan: ?* Headache ?Bacterial meningitis unlikely but cannot rule out a septic meningitis.  Fortunately, headache improved.  He could also have a headache from lumbar puncture. ?-Continue Fioricet ?-Also on ibuprofen for myopericarditis. ? ? ?Myopericarditis ?Unclear etiology but suspect acute viral illness but seems to be recurrent issue.  He also admits to alcohol binging, drinking up to a pack a day at times.  TTE and cardiac MRI as above.  No significant cardiac history in immediate familys. ?-On colchicine and NSAID ?-Discussed about alcohol cessation ?-Continue monitoring ? ?Chronic systolic CHF ?Patient with reduced EF in the setting of myopericarditis in 11/2020.  Recovered EF based on TTE on 06/22/2021.  Now with reduced EF on current TTE in the setting of myopericarditis.  Overall, he looks compensated and euvolemic.  He is not on diuretics at home. ?-Per cardiology ? ?Elevated troponin ?Likely from myopericarditis versus ACS. ?-Continue cycling ? ?SIRS (systemic inflammatory response syndrome) (HCC) ?Likely from myopericarditis.  Infectious work-up including COVID-19  and influenza PCR, blood culture, CSF culture, Plasmodium or other parasite, Lyme serology and HIV negative so far. ?-Continue doxycycline per ID ?-Follow other serologies and specialty labs. ? ?Alcohol abuse ?Patient admits to intermittent binging.  Last drink was on Friday.  No withdrawal symptoms. ?-Encouraged alcohol cessation given his underlying  cardiac history. ?-Thiamine, folic acid and multivitamins ?-Closely monitor for withdrawal symptoms ? ?Febrile illness ?Unclear source of infection at this time. ?-Continue doxycycline per ID ?-Follow other infectious work-up/labs ? ? ? ?DVT prophylaxis:  ?SCDs Start: 07/21/21 1121 ? ?Code Status: Full code ?Family Communication: Updated patient's wife at bedside. ?Level of care: Progressive ?Status is: Inpatient ?Remains inpatient appropriate because: Evaluation of work-up for myopericarditis and systolic SHF ? ? ?Final disposition: Likely home once medically cleared ?Consultants:  ?Cardiology ?Infectious disease ? ?Sch Meds:  ?Scheduled Meds: ? colchicine  0.6 mg Oral BID  ? ibuprofen  800 mg Oral TID  ? lidocaine  1 patch Transdermal Q24H  ? saccharomyces boulardii  250 mg Oral BID  ? ?Continuous Infusions: ? doxycycline (VIBRAMYCIN) IV 100 mg (07/22/21 0951)  ? ?PRN Meds:.butalbital-acetaminophen-caffeine, HYDROmorphone (DILAUDID) injection, ondansetron **OR** ondansetron (ZOFRAN) IV ? ?Antimicrobials: ?Anti-infectives (From admission, onward)  ? ? Start     Dose/Rate Route Frequency Ordered Stop  ? 07/21/21 1200  doxycycline (VIBRAMYCIN) 100 mg in sodium chloride 0.9 % 250 mL IVPB       ? 100 mg ?125 mL/hr over 120 Minutes Intravenous 2 times daily 07/21/21 1142    ? 07/21/21 0830  vancomycin (VANCOCIN) IVPB 1000 mg/200 mL premix  Status:  Discontinued       ? 1,000 mg ?200 mL/hr over 60 Minutes Intravenous Every 8 hours 07/21/21 0816 07/21/21 1119  ? 07/21/21 0600  vancomycin (VANCOCIN) IVPB 1000 mg/200 mL premix  Status:  Discontinued       ? 1,000 mg ?200 mL/hr over 60 Minutes Intravenous Every 8 hours 07/20/21 2149 07/20/21 2202  ? 07/20/21 2203  vancomycin (VANCOCIN) 1000 MG powder       ?Note to Pharmacy: April Holding C: cabinet override  ?    07/20/21 2203 07/21/21 1014  ? 07/20/21 2203  vancomycin (VANCOCIN) 500 MG powder       ?Note to Pharmacy: April Holding C: cabinet override  ?    07/20/21  2203 07/21/21 1014  ? 07/20/21 2200  Vancomycin (VANCOCIN) 1,500 mg in sodium chloride 0.9 % 500 mL IVPB  Status:  Discontinued       ? 1,500 mg ?250 mL/hr over 120 Minutes Intravenous Every 12 hours 07/20/21 2045 07/20/21 2046  ? 07/20/21 2100  Vancomycin (VANCOCIN) 1,500 mg in sodium chloride 0.9 % 500 mL IVPB       ? 1,500 mg ?250 mL/hr over 120 Minutes Intravenous  Once 07/20/21 2046 07/21/21 0131  ? 07/20/21 2045  vancomycin (VANCOCIN) IVPB 1000 mg/200 mL premix  Status:  Discontinued       ? 1,000 mg ?200 mL/hr over 60 Minutes Intravenous  Once 07/20/21 2040 07/20/21 2045  ? 07/20/21 2045  cefTRIAXone (ROCEPHIN) 2 g in sodium chloride 0.9 % 100 mL IVPB  Status:  Discontinued       ? 2 g ?200 mL/hr over 30 Minutes Intravenous Every 12 hours 07/20/21 2040 07/21/21 1119  ? ?  ? ? ? ?I have personally reviewed the following labs and images: ?CBC: ?Recent Labs  ?Lab 07/20/21 ?2045 07/22/21 ?0502  ?WBC 5.6 12.6*  ?NEUTROABS 3.8 9.4*  ?HGB 15.1 14.2  ?  HCT 45.2 42.8  ?MCV 86.6 88.6  ?PLT 143* 129*  ? ?BMP &GFR ?Recent Labs  ?Lab 07/20/21 ?2045 07/21/21 ?1317  ?NA 138  --   ?K 3.8  --   ?CL 103  --   ?CO2 29  --   ?GLUCOSE 98  --   ?BUN 16  --   ?CREATININE 1.12  --   ?CALCIUM 9.1  --   ?MG  --  1.9  ?PHOS  --  3.5  ? ?Estimated Creatinine Clearance: 103.6 mL/min (by C-G formula based on SCr of 1.12 mg/dL). ?Liver & Pancreas: ?Recent Labs  ?Lab 07/20/21 ?2045  ?AST 37  ?ALT 35  ?ALKPHOS 49  ?BILITOT 0.4  ?PROT 7.3  ?ALBUMIN 4.1  ? ?No results for input(s): LIPASE, AMYLASE in the last 168 hours. ?No results for input(s): AMMONIA in the last 168 hours. ?Diabetic: ?No results for input(s): HGBA1C in the last 72 hours. ?No results for input(s): GLUCAP in the last 168 hours. ?Cardiac Enzymes: ?Recent Labs  ?Lab 07/21/21 ?1128 07/22/21 ?0502  ?CKTOTAL 600* 934*  ? ?No results for input(s): PROBNP in the last 8760 hours. ?Coagulation Profile: ?No results for input(s): INR, PROTIME in the last 168 hours. ?Thyroid Function  Tests: ?Recent Labs  ?  07/21/21 ?1202  ?TSH 0.974  ? ?Lipid Profile: ?No results for input(s): CHOL, HDL, LDLCALC, TRIG, CHOLHDL, LDLDIRECT in the last 72 hours. ?Anemia Panel: ?No results for input(s): VITAMINB

## 2021-07-23 DIAGNOSIS — R519 Headache, unspecified: Secondary | ICD-10-CM | POA: Diagnosis not present

## 2021-07-23 DIAGNOSIS — G44001 Cluster headache syndrome, unspecified, intractable: Secondary | ICD-10-CM | POA: Diagnosis not present

## 2021-07-23 DIAGNOSIS — M549 Dorsalgia, unspecified: Secondary | ICD-10-CM

## 2021-07-23 DIAGNOSIS — R778 Other specified abnormalities of plasma proteins: Secondary | ICD-10-CM | POA: Diagnosis not present

## 2021-07-23 DIAGNOSIS — R5081 Fever presenting with conditions classified elsewhere: Secondary | ICD-10-CM

## 2021-07-23 DIAGNOSIS — I319 Disease of pericardium, unspecified: Secondary | ICD-10-CM | POA: Diagnosis not present

## 2021-07-23 DIAGNOSIS — R509 Fever, unspecified: Secondary | ICD-10-CM | POA: Diagnosis not present

## 2021-07-23 DIAGNOSIS — I5023 Acute on chronic systolic (congestive) heart failure: Secondary | ICD-10-CM

## 2021-07-23 DIAGNOSIS — F1010 Alcohol abuse, uncomplicated: Secondary | ICD-10-CM | POA: Diagnosis not present

## 2021-07-23 LAB — CBC WITH DIFFERENTIAL/PLATELET
Abs Immature Granulocytes: 0.01 10*3/uL (ref 0.00–0.07)
Basophils Absolute: 0.1 10*3/uL (ref 0.0–0.1)
Basophils Relative: 1 %
Eosinophils Absolute: 0.2 10*3/uL (ref 0.0–0.5)
Eosinophils Relative: 3 %
HCT: 43.2 % (ref 39.0–52.0)
Hemoglobin: 14.2 g/dL (ref 13.0–17.0)
Immature Granulocytes: 0 %
Lymphocytes Relative: 38 %
Lymphs Abs: 2 10*3/uL (ref 0.7–4.0)
MCH: 29.1 pg (ref 26.0–34.0)
MCHC: 32.9 g/dL (ref 30.0–36.0)
MCV: 88.5 fL (ref 80.0–100.0)
Monocytes Absolute: 0.8 10*3/uL (ref 0.1–1.0)
Monocytes Relative: 15 %
Neutro Abs: 2.3 10*3/uL (ref 1.7–7.7)
Neutrophils Relative %: 43 %
Platelets: 172 10*3/uL (ref 150–400)
RBC: 4.88 MIL/uL (ref 4.22–5.81)
RDW: 12.7 % (ref 11.5–15.5)
WBC: 5.3 10*3/uL (ref 4.0–10.5)
nRBC: 0 % (ref 0.0–0.2)

## 2021-07-23 LAB — CBC
HCT: 38.5 % — ABNORMAL LOW (ref 39.0–52.0)
Hemoglobin: 12.6 g/dL — ABNORMAL LOW (ref 13.0–17.0)
MCH: 28.8 pg (ref 26.0–34.0)
MCHC: 32.7 g/dL (ref 30.0–36.0)
MCV: 88.1 fL (ref 80.0–100.0)
Platelets: 140 10*3/uL — ABNORMAL LOW (ref 150–400)
RBC: 4.37 MIL/uL (ref 4.22–5.81)
RDW: 12.7 % (ref 11.5–15.5)
WBC: 6.9 10*3/uL (ref 4.0–10.5)
nRBC: 0 % (ref 0.0–0.2)

## 2021-07-23 LAB — RESPIRATORY PANEL BY PCR

## 2021-07-23 LAB — RENAL FUNCTION PANEL
Albumin: 3.3 g/dL — ABNORMAL LOW (ref 3.5–5.0)
Anion gap: 2 — ABNORMAL LOW (ref 5–15)
BUN: 16 mg/dL (ref 6–20)
CO2: 26 mmol/L (ref 22–32)
Calcium: 8.4 mg/dL — ABNORMAL LOW (ref 8.9–10.3)
Chloride: 112 mmol/L — ABNORMAL HIGH (ref 98–111)
Creatinine, Ser: 0.93 mg/dL (ref 0.61–1.24)
GFR, Estimated: >60 mL/min (ref >60)
Glucose, Bld: 100 mg/dL — ABNORMAL HIGH (ref 70–99)
Phosphorus: 3.2 mg/dL (ref 2.5–4.6)
Potassium: 3.9 mmol/L (ref 3.5–5.1)
Sodium: 140 mmol/L (ref 135–145)

## 2021-07-23 LAB — TROPONIN I (HIGH SENSITIVITY)
Troponin I (High Sensitivity): 3609 ng/L (ref <18)
Troponin I (High Sensitivity): 3369 ng/L (ref <18)

## 2021-07-23 LAB — COXSACKIE A VIRUS ANTIBODIES
Coxsackie A16 IgG: 1:100 {titer} — ABNORMAL HIGH
Coxsackie A16 IgM: NEGATIVE titer
Coxsackie A24 IgG: 1:400 {titer} — ABNORMAL HIGH
Coxsackie A24 IgM: NEGATIVE titer
Coxsackie A7 IgG: 1:100 {titer} — ABNORMAL HIGH
Coxsackie A7 IgM: NEGATIVE titer
Coxsackie A9 IgG: 1:100 {titer} — ABNORMAL HIGH
Coxsackie A9 IgM: NEGATIVE titer

## 2021-07-23 LAB — PROCALCITONIN: Procalcitonin: <0.1 ng/mL

## 2021-07-23 LAB — MAGNESIUM: Magnesium: 2.1 mg/dL (ref 1.7–2.4)

## 2021-07-23 LAB — ZIKA VIRUS IGM (EUA)

## 2021-07-23 LAB — CK: Total CK: 361 U/L (ref 49–397)

## 2021-07-23 MED ORDER — METOPROLOL SUCCINATE ER 25 MG PO TB24: 12.5000 mg | ORAL_TABLET | Freq: Every day | ORAL | Status: DC

## 2021-07-23 MED ORDER — METHOCARBAMOL 500 MG PO TABS
750.0000 mg | ORAL_TABLET | Freq: Three times a day (TID) | ORAL | Status: AC
  Administered 2021-07-23 (×2): 750 mg via ORAL
  Filled 2021-07-23 (×2): qty 2

## 2021-07-23 MED ORDER — PANTOPRAZOLE SODIUM 40 MG PO TBEC
40.0000 mg | DELAYED_RELEASE_TABLET | Freq: Every day | ORAL | Status: DC
  Administered 2021-07-23 – 2021-07-25 (×3): 40 mg via ORAL
  Filled 2021-07-23 (×3): qty 1

## 2021-07-23 MED ORDER — METHOCARBAMOL 500 MG PO TABS
750.0000 mg | ORAL_TABLET | Freq: Four times a day (QID) | ORAL | Status: DC | PRN
  Administered 2021-07-23: 750 mg via ORAL
  Filled 2021-07-23: qty 2

## 2021-07-23 MED ORDER — METHOCARBAMOL 500 MG PO TABS: 750.0000 mg | ORAL_TABLET | Freq: Four times a day (QID) | ORAL | Status: DC | PRN

## 2021-07-23 NOTE — Progress Notes (Addendum)
? ?Progress Note ? ?Patient Name: Dale Macias ?Date of Encounter: 07/23/2021 ? ?[facility] Cardiologist: New to Dr Eden Emms  ? ?Subjective  ? ?Patient states he is feeling overall well, denied any chest pain, SOB, fever, chills, or diarrhea. He states he still has headache, endorses pain of her thoracic spine, felt that when he moves around the pain of his spine radiating up to his head. He is wondering if he has any autoimmune conditions.  ? ?Inpatient Medications  ?  ?Scheduled Meds: ? colchicine  0.6 mg Oral BID  ? ibuprofen  800 mg Oral TID  ? lidocaine  1 patch Transdermal Q24H  ? pantoprazole  40 mg Oral Daily  ? saccharomyces boulardii  250 mg Oral BID  ? sacubitril-valsartan  1 tablet Oral BID  ? ?Continuous Infusions: ? doxycycline (VIBRAMYCIN) IV 100 mg (07/22/21 2159)  ? ?PRN Meds: ?butalbital-acetaminophen-caffeine, HYDROmorphone (DILAUDID) injection, ondansetron **OR** ondansetron (ZOFRAN) IV  ? ?Vital Signs  ?  ?Vitals:  ? 07/22/21 0428 07/22/21 1423 07/22/21 2037 07/23/21 0509  ?BP: 113/75 108/68 (!) 108/96 106/68  ?Pulse: 90 80 80 66  ?Resp: 19 18 20 16   ?Temp: 98 ?F (36.7 ?C) 98.3 ?F (36.8 ?C) 98.3 ?F (36.8 ?C) 98.3 ?F (36.8 ?C)  ?TempSrc: Oral Oral Oral Oral  ?SpO2: 94% 99% 96% 97%  ?Weight:      ?Height:      ? ? ?Intake/Output Summary (Last 24 hours) at 07/23/2021 0836 ?Last data filed at 07/23/2021 0600 ?Gross per 24 hour  ?Intake 1067.42 ml  ?Output --  ?Net 1067.42 ml  ? ? ?  07/20/2021  ?  7:28 PM 06/12/2021  ?  8:46 AM 02/26/2021  ?  2:45 PM  ?Last 3 Weights  ?Weight (lbs) 190 lb 196 lb 9.6 oz 198 lb 6.4 oz  ?Weight (kg) 86.183 kg 89.177 kg 89.994 kg  ?   ? ?Telemetry  ?  ?Sinus bradycardia/rhythm with rate of 45-60s - Personally Reviewed ? ?ECG  ?  ?No new tracing today - Personally Reviewed ? ?Physical Exam  ? ?GEN: No acute distress.   ?Neck: No JVD ?Cardiac: RRR, no murmurs, rubs, or gallops.  ?Respiratory: Clear to auscultation bilaterally. On room air.  ?GI: Soft, nontender,  non-distended  ?MS: No leg edema; No deformity. ?Neuro:  Nonfocal  ?Psych: Normal affect  ? ?Labs  ?  ?High Sensitivity Troponin:   ?Recent Labs  ?Lab 07/20/21 ?2045 07/20/21 ?2314 07/21/21 ?1137 07/21/21 ?1317 07/22/21 ?0502  ?TROPONINIHS 91* 244* 1,123* 2,[id number]*  ?   ?Chemistry ?Recent Labs  ?Lab 07/20/21 ?2045 07/21/21 ?1317 07/23/21 ?0411  ?NA 138  --  140  ?K 3.8  --  3.9  ?CL 103  --  112*  ?CO2 29  --  26  ?GLUCOSE 98  --  100*  ?BUN 16  --  16  ?CREATININE 1.12  --  0.93  ?CALCIUM 9.1  --  8.4*  ?MG  --  1.9 2.1  ?PROT 7.3  --   --   ?ALBUMIN 4.1  --  3.3*  ?AST 37  --   --   ?ALT 35  --   --   ?ALKPHOS 49  --   --   ?BILITOT 0.4  --   --   ?GFRNONAA >60  --  >60  ?ANIONGAP 6  --  2*  ?  ?Lipids No results for input(s): CHOL, TRIG, HDL, LABVLDL, LDLCALC, CHOLHDL in the last 168 hours.  ?Hematology ?Recent Labs  ?  Lab 07/20/21 ?2045 07/22/21 ?0502 07/23/21 ?0411  ?WBC 5.6 12.6* 6.9  ?RBC 5.22 4.83 4.37  ?HGB 15.1 14.2 12.6*  ?HCT 45.2 42.8 38.5*  ?MCV 86.6 88.6 88.1  ?MCH 28.9 29.4 28.8  ?MCHC 33.4 33.2 32.7  ?RDW 12.8 13.0 12.7  ?PLT 143* 129* 140*  ? ?Thyroid  ?Recent Labs  ?Lab 07/21/21 ?1202  ?TSH 0.974  ?  ?BNPNo results for input(s): BNP, PROBNP in the last 168 hours.  ?DDimer No results for input(s): DDIMER in the last 168 hours.  ? ?Radiology  ?  ?DG Chest 1 View ? ?Result Date: 07/22/2021 ?CLINICAL DATA:  Follow-up of CHF EXAM: CHEST  1 VIEW COMPARISON:  Yesterday FINDINGS: Numerous leads and wires project over the chest. Midline trachea. Borderline cardiomegaly. No pleural effusion or pneumothorax. Development of mild perihilar interstitial thickening. No lobar consolidation. IMPRESSION: Development of mild perihilar interstitial thickening which given the clinical history likely represents pulmonary venous congestion. Electronically Signed   By: Abigail Miyamoto M.D.   On: 07/22/2021 08:10  ? ?DG Chest 1 View ? ?Result Date: 07/21/2021 ?CLINICAL DATA:  CHF. EXAM: CHEST  1 VIEW COMPARISON:  Frontal  and lateral views earlier today FINDINGS: The heart is normal in size.The cardiomediastinal contours are normal. The lungs are clear. Pulmonary vasculature is normal. No consolidation, pleural effusion, or pneumothorax. No acute osseous abnormalities are seen. IMPRESSION: Negative portable AP view of the chest. Electronically Signed   By: Keith Rake M.D.   On: 07/21/2021 17:54  ? ?DG Chest 2 View ? ?Result Date: 07/21/2021 ?CLINICAL DATA:  Chest pain/shortness of breath EXAM: CHEST - 2 VIEW COMPARISON:  Chest x-ray dated December 13, 2020 FINDINGS: The heart size and mediastinal contours are within normal limits. Both lungs are clear. The visualized skeletal structures are unremarkable. IMPRESSION: No active cardiopulmonary disease. Electronically Signed   By: Yetta Glassman M.D.   On: 07/21/2021 15:30  ? ?MR CARDIAC MORPHOLOGY W WO CONTRAST ? ?Result Date: 07/22/2021 ?CLINICAL DATA:  Myocarditis EXAM: CARDIAC MRI TECHNIQUE: The patient was scanned on a 1.5 Tesla Siemens magnet. A dedicated cardiac coil was used. Functional imaging was done using Fiesta sequences. 2,3, and 4 chamber views were done to assess for RWMA's. Modified Simpson's rule using a short axis stack was used to calculate an ejection fraction on a dedicated work Conservation officer, nature. The patient received 8 cc of Gadavist. After 10 minutes inversion recovery sequences were used to assess for infiltration and scar tissue. CONTRAST:  Gadavist FINDINGS: Normal atrial sizes. No PFO / ASD. Normal ascending thoracic aorta 2.9 cm Normal appearing cardiac valves Non rheumatic mild appearing MR Small but nearly circumferential pericardial effusion Gadolinium uptake along the RV/LV free wall pericardium as well as lateral apical LV myocardial wall and septum T1 native 1111 msec elevated ECV: Elevated 46% T2 55 msec Mild LVE with global hypokinesis Quantitative EF 48% (EDV 194 cc ESV 101 cc SV 93 cc ) Mild RVE with global hypokinesis  Quantitative EF 42% (EDV 157 cc ESV 92 cc SV 65 cc ) IMPRESSION: 1. Mild LVE global hypokinesis EF 48% 2.  Mild RVE global hypokinesis EF 42% 3. Gadolinium uptake in RV/LV free wall pericardium and septal, apical lateral myocardium Despite normal T2 and normal ESR on labs (CRP is elevated) Findings consistent with recurrent myopericarditis 4.  Elevated native T1 and ECV 5.  Small but nearly circumferential pericardial effusion [place] Electronically Signed   By: Jenkins Rouge M.D.   On: 07/22/2021  14:34  ? ?ECHOCARDIOGRAM COMPLETE ? ?Result Date: 07/21/2021 ?   ECHOCARDIOGRAM REPORT   Patient Name:   EDIN KON Date of Exam: 07/21/2021 Medical Rec #:  [id number]          Height:       71.0 in Accession #:    [id number]         Weight:       190.0 lb Date of Birth:  [birth year]          BSA:          2.063 m? Patient Age:    89 years           BP:           104/79 mmHg Patient Gender: M                  HR:           85 bpm. Exam Location:  Inpatient Procedure: 2D Echo Indications:    Elevated troponin  History:        Patient has prior history of Echocardiogram examinations, most                 recent 06/22/2021.  Sonographer:    Arlyss Gandy Referring Phys: [id number] [place]  1. Left ventricular ejection fraction, by estimation, is 30 to 35%. The left ventricle has moderately decreased function. The left ventricle demonstrates regional wall motion abnormalities (see scoring diagram/findings for description). There is mild left ventricular hypertrophy. Left ventricular diastolic parameters were normal.  2. Right ventricular systolic function is normal. The right ventricular size is normal. There is normal pulmonary artery systolic pressure. The estimated right ventricular systolic pressure is 96.2 mmHg.  3. The mitral valve is normal in structure. Trivial mitral valve regurgitation. No evidence of mitral stenosis.  4. The aortic valve is tricuspid. Aortic valve regurgitation is not  visualized. No aortic stenosis is present. Comparison(s): Prior images reviewed side by side. Lv function is worse from prior. FINDINGS  Left Ventricle: Left ventricular ejection fraction, by estimation, is 30 to 35%. The left v

## 2021-07-23 NOTE — Plan of Care (Signed)

## 2021-07-23 NOTE — Assessment & Plan Note (Addendum)
Likely postdural puncture headache.  No focal neurodeficit.  Resolved after blood patch. ?-Appreciate help by neurology and IR ?

## 2021-07-23 NOTE — Progress Notes (Addendum)
[facility]    Date of Admission:  07/20/2021   Total days of antibiotics 4           ID: Dale Macias is a 31 y.o. 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 No longer having chest pain.  Medications:   colchicine  0.6 mg Oral BID   ibuprofen  800 mg Oral TID   lidocaine  1 patch Transdermal Q24H   methocarbamol  750 mg Oral TID   pantoprazole  40 mg Oral Daily   saccharomyces boulardii  250 mg Oral BID   sacubitril-valsartan  1 tablet Oral BID    Objective: Vital signs in last 24 hours: Temp:  [98.3 F (36.8 C)-99 F (37.2 C)] 99 F (37.2 C) (04/27 1227) Pulse Rate:  [66-80] 74 (04/27 1227) Resp:  [16-20] 16 (04/27 0509) BP: (106-111)/(68-96) 111/74 (04/27 1227) SpO2:  [96 %-99 %] 97 % (04/27 1227) Gen =a x o by 3 in NAD Neuro = CN2-12 motor in tact  Lab Results Recent Labs    07/20/21 2045 07/22/21 0502 07/23/21 0411  WBC 5.6 12.6* 6.9  HGB 15.1 14.2 12.6*  HCT 45.2 42.8 38.5*  NA 138  --  140  K 3.8  --  3.9  CL 103  --  112*  CO2 29  --  26  BUN 16  --  16  CREATININE 1.12  --  0.93   Liver Panel Recent Labs    07/20/21 2045 07/23/21 0411  PROT 7.3  --   ALBUMIN 4.1 3.3*  AST 37  --   ALT 35  --   ALKPHOS 49  --   BILITOT 0.4  --    Sedimentation Rate Recent Labs    07/21/21 1128  ESRSEDRATE 8   C-Reactive Protein Recent Labs    07/21/21 1128  CRP 4.5*    Microbiology: reviewed Studies/Results: DG Chest 1 View  Result Date: 07/22/2021 CLINICAL DATA:  Follow-up of CHF EXAM: CHEST  1 VIEW COMPARISON:  Yesterday FINDINGS: Numerous leads and  wires project over the chest. Midline trachea. Borderline cardiomegaly. No pleural effusion or pneumothorax. Development of mild perihilar interstitial thickening. No lobar consolidation. IMPRESSION: Development of mild perihilar interstitial thickening which given the clinical history likely represents pulmonary venous congestion. Electronically Signed   By: Jeronimo Greaves M.D.   On: 07/22/2021 08:10   DG Chest 1 View  Result Date: 07/21/2021 CLINICAL DATA:  CHF. EXAM: CHEST  1 VIEW COMPARISON:  Frontal and lateral views earlier today FINDINGS: The heart is normal in size.The cardiomediastinal contours are normal. The lungs are clear. Pulmonary vasculature is normal. No consolidation, pleural effusion, or pneumothorax. No acute osseous abnormalities are seen. IMPRESSION: Negative portable AP view of the chest. Electronically Signed   By: Narda Rutherford M.D.   On: 07/21/2021 17:54   DG Chest 2 View  Result Date: 07/21/2021 CLINICAL DATA:  Chest pain/shortness of breath EXAM: CHEST - 2 VIEW COMPARISON:  Chest x-ray dated December 13, 2020 FINDINGS: The heart size and mediastinal contours are within normal limits. Both lungs are clear. The visualized skeletal structures  are unremarkable. IMPRESSION: No active cardiopulmonary disease. Electronically Signed   By: Allegra Lai M.D.   On: 07/21/2021 15:30   MR CARDIAC MORPHOLOGY W WO CONTRAST  Result Date: 07/22/2021 CLINICAL DATA:  Myocarditis EXAM: CARDIAC MRI TECHNIQUE: The patient was scanned on a 1.5 Tesla Siemens magnet. A dedicated cardiac coil was used. Functional imaging was done using Fiesta sequences. 2,3, and 4 chamber views were done to assess for RWMA's. Modified Simpson's rule using a short axis stack was used to calculate an ejection fraction on a dedicated work Research officer, trade [employer]. The patient received 8 cc of Gadavist. After 10 minutes inversion recovery sequences were used to assess for infiltration and scar tissue. CONTRAST:   Gadavist FINDINGS: Normal atrial sizes. No PFO / ASD. Normal ascending thoracic aorta 2.9 cm Normal appearing cardiac valves Non rheumatic mild appearing MR Small but nearly circumferential pericardial effusion Gadolinium uptake along the RV/LV free wall pericardium as well as lateral apical LV myocardial wall and septum T1 native 1111 msec elevated ECV: Elevated 46% T2 55 msec Mild LVE with global hypokinesis Quantitative EF 48% (EDV 194 cc ESV 101 cc SV 93 cc ) Mild RVE with global hypokinesis Quantitative EF 42% (EDV 157 cc ESV 92 cc SV 65 cc ) IMPRESSION: 1. Mild LVE global hypokinesis EF 48% 2.  Mild RVE global hypokinesis EF 42% 3. Gadolinium uptake in RV/LV free wall pericardium and septal, apical lateral myocardium Despite normal T2 and normal ESR on labs (CRP is elevated) Findings consistent with recurrent myopericarditis 4.  Elevated native T1 and ECV 5.  Small but nearly circumferential pericardial effusion Charlton Haws Electronically Signed   By: Charlton Haws M.D.   On: 07/22/2021 14:34   ECHOCARDIOGRAM COMPLETE  Result Date: 07/21/2021    ECHOCARDIOGRAM REPORT   Patient Name:   Dale Macias Date of Exam: 07/21/2021 Medical Rec #:  [id number]          Height:       71.0 in Accession #:    [id number]         Weight:       190.0 lb Date of Birth:  Apr 14, 1990          BSA:          2.063 m Patient Age:    30 years           BP:           104/79 mmHg Patient Gender: M                  HR:           85 bpm. Exam Location:  Inpatient Procedure: 2D Echo Indications:    Elevated troponin  History:        Patient has prior history of Echocardiogram examinations, most                 recent 06/22/2021.  Sonographer:    Devonne Doughty Referring Phys: [id number] [place] IMPRESSIONS  1. Left ventricular ejection fraction, by estimation, is 30 to 35%. The left ventricle has moderately decreased function. The left ventricle demonstrates regional wall motion abnormalities (see scoring diagram/findings for  description). There is mild left ventricular hypertrophy. Left ventricular diastolic parameters were normal.  2. Right ventricular systolic function is normal. The right ventricular size is normal. There is normal pulmonary artery systolic pressure. The estimated right ventricular systolic pressure is 15.4 mmHg.  3. The mitral valve is normal in  structure. Trivial mitral valve regurgitation. No evidence of mitral stenosis.  4. The aortic valve is tricuspid. Aortic valve regurgitation is not visualized. No aortic stenosis is present. Comparison(s): Prior images reviewed side by side. Lv function is worse from prior. FINDINGS  Left Ventricle: Left ventricular ejection fraction, by estimation, is 30 to 35%. The left ventricle has moderately decreased function. The left ventricle demonstrates regional wall motion abnormalities. The left ventricular internal cavity size was normal in size. There is mild left ventricular hypertrophy. Left ventricular diastolic parameters were normal.  LV Wall Scoring: The entire apex is hypokinetic. Right Ventricle: The right ventricular size is normal. No increase in right ventricular wall thickness. Right ventricular systolic function is normal. There is normal pulmonary artery systolic pressure. The tricuspid regurgitant velocity is 1.76 m/s, and  with an assumed right atrial pressure of 3 mmHg, the estimated right ventricular systolic pressure is 15.4 mmHg. Left Atrium: Left atrial size was normal in size. Right Atrium: Right atrial size was normal in size. Pericardium: There is no evidence of pericardial effusion. Mitral Valve: The mitral valve is normal in structure. Trivial mitral valve regurgitation. No evidence of mitral valve stenosis. Tricuspid Valve: The tricuspid valve is normal in structure. Tricuspid valve regurgitation is trivial. No evidence of tricuspid stenosis. Aortic Valve: The aortic valve is tricuspid. Aortic valve regurgitation is not visualized. No aortic stenosis  is present. Aortic valve mean gradient measures 2.0 mmHg. Aortic valve peak gradient measures 3.5 mmHg. Aortic valve area, by VTI measures 3.05 cm. Pulmonic Valve: The pulmonic valve was not well visualized. Pulmonic valve regurgitation is not visualized. No evidence of pulmonic stenosis. Aorta: The aortic root and ascending aorta are structurally normal, with no evidence of dilitation. IAS/Shunts: No atrial level shunt detected by color flow Doppler.  LEFT VENTRICLE PLAX 2D LVIDd:         4.90 cm      Diastology LVIDs:         4.20 cm      LV e' medial:    12.30 cm/s LV PW:         1.10 cm      LV E/e' medial:  5.6 LV IVS:        1.10 cm      LV e' lateral:   9.57 cm/s LVOT diam:     2.00 cm      LV E/e' lateral: 7.2 LV SV:         52 LV SV Index:   25 LVOT Area:     3.14 cm  LV Volumes (MOD) LV vol d, MOD A2C: 112.0 ml LV vol d, MOD A4C: 105.0 ml LV vol s, MOD A2C: 78.8 ml LV vol s, MOD A4C: 68.4 ml LV SV MOD A2C:     33.2 ml LV SV MOD A4C:     105.0 ml LV SV MOD BP:      35.8 ml RIGHT VENTRICLE             IVC RV Basal diam:  3.10 cm     IVC diam: 2.00 cm RV Mid diam:    2.60 cm RV S prime:     12.70 cm/s TAPSE (M-mode): 2.0 cm LEFT ATRIUM           Index        RIGHT ATRIUM           Index LA diam:      3.20 cm 1.55 cm/m   RA Area:  13.40 cm LA Vol (A4C): 30.9 ml 14.98 ml/m  RA Volume:   31.00 ml  15.03 ml/m  AORTIC VALVE                    PULMONIC VALVE AV Area (Vmax):    3.00 cm     PV Vmax:       0.71 m/s AV Area (Vmean):   2.99 cm     PV Peak grad:  2.0 mmHg AV Area (VTI):     3.05 cm AV Vmax:           93.70 cm/s AV Vmean:          66.500 cm/s AV VTI:            0.170 m AV Peak Grad:      3.5 mmHg AV Mean Grad:      2.0 mmHg LVOT Vmax:         89.40 cm/s LVOT Vmean:        63.300 cm/s LVOT VTI:          0.165 m LVOT/AV VTI ratio: 0.97  AORTA Ao Root diam: 3.30 cm Ao Asc diam:  3.00 cm MITRAL VALVE               TRICUSPID VALVE MV Area (PHT): 2.21 cm    TR Peak grad:   12.4 mmHg MV Decel Time:  343 msec    TR Vmax:        176.00 cm/s MV E velocity: 69.00 cm/s MV A velocity: 38.20 cm/s  SHUNTS MV E/A ratio:  1.81        Systemic VTI:  0.16 m                            Systemic Diam: 2.00 cm Riley Lam MD Electronically signed by Riley Lam MD Signature Date/Time: 07/21/2021/4:29:11 PM    Final      Assessment/Plan: Febrile illness with ha = lumbar puncture CSF analysis was WNL. Also did tickborne panel which was negative; zika pending; malaria negative - since recently returned from [place]. Trying to have lab send CSF for meningitis panel to see if enterovirus can be identified. Coxsackie suggest remote illness. May not necessarily identify specific viral infection that triggered heart condition.  Will check adenovirus ab.  Myopericarditis = continue with colchicine and ibuprofen  MSK pain = would schedule robaxin for first few doses to see if symptoms improve. If postural headaches persist, consider that it maybe due to LP, may need blood patch.  [facility] Pager: [phone]  07/23/2021, 1:04 PM

## 2021-07-23 NOTE — Progress Notes (Signed)
?PROGRESS NOTE ? ?Dale Macias WLS:[id number] DOB: [birth year]  ? ?PCP: Pcp, No ? ?Patient is from: Home ? ?DOA: 07/20/2021 LOS: 2 ? ?Chief complaints ?Chief Complaint  ?Patient presents with  ? Torticollis  ? Headache  ?  ? ?Brief Narrative / Interim history: ?31 year old M with PMH of systolic CHF in the setting of myopericarditis in 11/2020 with recovered EF and alcohol abuse presenting with fever, chills, severe headache, neck pain, nausea and intermittent chest pain.  Started on broad-spectrum antibiotics with concern for meningitis.   He had LP with sterile CSF.  Infectious disease consulted.   ? ?Patient also has elevated troponin rising from [phone] in the setting of myopericarditis. TTE with LVEF of 30 to 35%, RWMA.  He underwent cardiac MRI that showed mild LV global hypokinesis with EF of 48%, mild RV global hypokinesis with EF of 42% and gadolinium uptake concerning for myopericarditis and a small but nearly circumferential pericardial effusion.  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 Coxsackie AB IgG positive with low titer but IgM negative.  Coxsackie B virus antibodies.  He is normal ESR, negative ANA and slightly elevated CRP argues against autoimmune or chronic inflammatory process.  Patient remains on ibuprofen and colchicine per cardiology.  Also started on Entresto for acute systolic CHF.  ? ?From infectious standpoint, work-up including blood culture, CSF culture, HIV, Lyme, RMSF, malaria, COVID-19 and influenza negative.  Zika virus IgM and enteroviral studies pending.   ? ?Subjective: ?Seen and examined earlier this morning.  No major events overnight of this morning.  He reports upper back and neck pain as well as headache, mainly over occipital areas.  He has no nausea or vomiting.  No photophobia or phonophobia.  No focal neuro symptoms.  He denies chest pain or dyspnea. ? ?Objective: ?Vitals:  ? 07/22/21 1423 07/22/21 2037 07/23/21 0509 07/23/21  1227  ?BP: 108/68 (!) 108/96 106/68 111/74  ?Pulse: 80 80 66 74  ?Resp: _0 ?Temp: 98.3 ?F (36.8 ?C) 98.3 ?F (36.8 ?C) 98.3 ?F (36.8 ?C) 99 ?F (37.2 ?C)  ?TempSrc: Oral Oral Oral Oral  ?SpO2: 99% 96% 97% 97%  ?Weight:      ?Height:      ? ? ?Examination: ? ?GENERAL: No apparent distress.  Nontoxic. ?HEENT: MMM.  Vision and hearing grossly intact.  ?NECK: Supple.  No apparent JVD.  ?RESP:  No IWOB.  Fair aeration bilaterally. ?CVS:  RRR. Heart sounds normal.  ?ABD/GI/GU: BS+. Abd soft, NTND.  ?MSK/EXT:  Tenderness over upper paraspinal muscles but no tenderness over spinous process. ?SKIN: no apparent skin lesion or wound ?NEURO: Awake and alert. Oriented appropriately.  No apparent focal neuro deficit. ?PSYCH: Calm. Normal affect.  ? ?Procedures:  ?4/25-lumbar puncture ? ?Microbiology summarized: ?COVID-19 and influenza PCR nonreactive. ?Blood cultures NGTD. ?CSF culture NGTD. ? ?Assessment and Plan: ?* SIRS with fever, headache and neck pain ?Patient returned from [place] 2 [place] prior to admission.  He work-up including blood culture, CSF culture, HIV, Lyme, RMSF, malaria, COVID-19 and influenza negative.  Procalcitonin persistently negative arguing against bacterial infection.  Antibiotics discontinued by ID. ?-ID added enterovirus to CSF to rule out viral meningitis. ?-Follow studies for Zika virus although less likely. ?-Check full RVP ? ?Myopericarditis ?Unclear etiology but suspect acute viral illness but seems to be recurrent issue.  Coxsackie a IgG positive but IgM negative suggesting remote infection.  Normal ESR with negative ANA argues against chronic inflammatory  or autoimmune process.  He has mildly elevated CRP suggesting acute inflammatory process.  TTE and cardiac MRI as above.  No significant cardiac history in immediate familys. ?-On colchicine and NSAID per cardiology ?-Cardiology added Entresto ?-Discussed about alcohol cessation ? ?Acute on chronic systolic CHF (congestive  heart failure) ([place]) ?Patient with reduced EF in the setting of myopericarditis in 11/2020.  Recovered EF based on TTE on 06/22/2021.  Now with reduced EF on current TTE in the setting of myopericarditis.  Overall, he looks compensated and euvolemic.  He is not on diuretics at home. ?-On Entresto per cardiology. ?-Encourage alcohol cessation ? ?Acute upper back pain, neck pain and headache ?Looks like muscle strain.  Some tenderness over paraspinal muscles but no tenderness over spinous process. ?-Add Robaxin and K-pad ? ?Alcohol abuse ?Patient admits to intermittent binging.  Last drink was on Friday.  No withdrawal symptoms. ?-Encouraged alcohol cessation given his underlying cardiac history. ?-Thiamine, folic acid and multivitamins ?-Closely monitor for withdrawal symptoms ? ?Elevated troponin ?Likely from myopericarditis versus ACS.  Troponin peaked at 4900 and started trending down.  TTE and MRI as above. ?-Cardiac meds as above ? ? ? ?DVT prophylaxis:  ?SCDs Start: 07/21/21 1121 ? ?Code Status: Full code ?Family Communication: Updated patient's mother at bedside. ?Level of care: Progressive ?Status is: Inpatient ?Remains inpatient appropriate because: Acute myopericarditis and acute systolic SHF ? ? ?Final disposition: Likely home on 4/28. ?Consultants:  ?Cardiology ?Infectious disease ? ?Sch Meds:  ?Scheduled Meds: ? colchicine  0.6 mg Oral BID  ? ibuprofen  800 mg Oral TID  ? lidocaine  1 patch Transdermal Q24H  ? methocarbamol  750 mg Oral TID  ? pantoprazole  40 mg Oral Daily  ? saccharomyces boulardii  250 mg Oral BID  ? sacubitril-valsartan  1 tablet Oral BID  ? ?Continuous Infusions: ? ? ?PRN Meds:.butalbital-acetaminophen-caffeine, HYDROmorphone (DILAUDID) injection, [START ON 07/24/2021] methocarbamol, ondansetron **OR** ondansetron (ZOFRAN) IV ? ?Antimicrobials: ?Anti-infectives (From admission, onward)  ? ? Start     Dose/Rate Route Frequency Ordered Stop  ? 07/21/21 1200  doxycycline (VIBRAMYCIN) 100  mg in sodium chloride 0.9 % 250 mL IVPB  Status:  Discontinued       ? 100 mg ?125 mL/hr over 120 Minutes Intravenous 2 times daily 07/21/21 1142 07/23/21 1059  ? 07/21/21 0830  vancomycin (VANCOCIN) IVPB 1000 mg/200 mL premix  Status:  Discontinued       ? 1,000 mg ?200 mL/hr over 60 Minutes Intravenous Every 8 hours 07/21/21 0816 07/21/21 1119  ? 07/21/21 0600  vancomycin (VANCOCIN) IVPB 1000 mg/200 mL premix  Status:  Discontinued       ? 1,000 mg ?200 mL/hr over 60 Minutes Intravenous Every 8 hours 07/20/21 2149 07/20/21 2202  ? 07/20/21 2203  vancomycin (VANCOCIN) 1000 MG powder       ?Note to Pharmacy: April Holding C: cabinet override  ?    07/20/21 2203 07/21/21 1014  ? 07/20/21 2203  vancomycin (VANCOCIN) 500 MG powder       ?Note to Pharmacy: April Holding C: cabinet override  ?    07/20/21 2203 07/21/21 1014  ? 07/20/21 2200  Vancomycin (VANCOCIN) 1,500 mg in sodium chloride 0.9 % 500 mL IVPB  Status:  Discontinued       ? 1,500 mg ?250 mL/hr over 120 Minutes Intravenous Every 12 hours 07/20/21 2045 07/20/21 2046  ? 07/20/21 2100  Vancomycin (VANCOCIN) 1,500 mg in sodium chloride 0.9 % 500 mL IVPB       ?  1,500 mg ?250 mL/hr over 120 Minutes Intravenous  Once 07/20/21 2046 07/21/21 0131  ? 07/20/21 2045  vancomycin (VANCOCIN) IVPB 1000 mg/200 mL premix  Status:  Discontinued       ? 1,000 mg ?200 mL/hr over 60 Minutes Intravenous  Once 07/20/21 2040 07/20/21 2045  ? 07/20/21 2045  cefTRIAXone (ROCEPHIN) 2 g in sodium chloride 0.9 % 100 mL IVPB  Status:  Discontinued       ? 2 g ?200 mL/hr over 30 Minutes Intravenous Every 12 hours 07/20/21 2040 07/21/21 1119  ? ?  ? ? ? ?I have personally reviewed the following labs and images: ?CBC: ?Recent Labs  ?Lab 07/20/21 ?2045 07/22/21 ?0502 07/23/21 ?0411  ?WBC 5.6 12.6* 6.9  ?NEUTROABS 3.8 9.4*  --   ?HGB 15.1 14.2 12.6*  ?HCT 45.2 42.8 38.5*  ?MCV 86.6 88.6 88.1  ?PLT 143* 129* 140*  ? ?BMP &GFR ?Recent Labs  ?Lab 07/20/21 ?2045 07/21/21 ?1317 07/23/21 ?0411   ?NA 138  --  140  ?K 3.8  --  3.9  ?CL 103  --  112*  ?CO2 29  --  26  ?GLUCOSE 98  --  100*  ?BUN 16  --  16  ?CREATININE 1.12  --  0.93  ?CALCIUM 9.1  --  8.4*  ?MG  --  1.9 2.1  ?PHOS  --  3.5 3.2

## 2021-07-24 ENCOUNTER — Inpatient Hospital Stay (HOSPITAL_COMMUNITY): Payer: BC Managed Care – PPO

## 2021-07-24 ENCOUNTER — Encounter (HOSPITAL_COMMUNITY): Payer: BC Managed Care – PPO

## 2021-07-24 DIAGNOSIS — G971 Other reaction to spinal and lumbar puncture: Secondary | ICD-10-CM | POA: Diagnosis not present

## 2021-07-24 DIAGNOSIS — F1010 Alcohol abuse, uncomplicated: Secondary | ICD-10-CM | POA: Diagnosis not present

## 2021-07-24 DIAGNOSIS — I5023 Acute on chronic systolic (congestive) heart failure: Secondary | ICD-10-CM | POA: Diagnosis not present

## 2021-07-24 DIAGNOSIS — R509 Fever, unspecified: Secondary | ICD-10-CM | POA: Diagnosis not present

## 2021-07-24 DIAGNOSIS — I319 Disease of pericardium, unspecified: Secondary | ICD-10-CM | POA: Diagnosis not present

## 2021-07-24 DIAGNOSIS — R519 Headache, unspecified: Secondary | ICD-10-CM | POA: Diagnosis not present

## 2021-07-24 DIAGNOSIS — R778 Other specified abnormalities of plasma proteins: Secondary | ICD-10-CM | POA: Diagnosis not present

## 2021-07-24 HISTORY — PX: IR FL GUIDED LOC OF NEEDLE/CATH TIP FOR SPINAL INJECTION LT: IMG2396

## 2021-07-24 LAB — RENAL FUNCTION PANEL
Albumin: 3.3 g/dL — ABNORMAL LOW (ref 3.5–5.0)
Anion gap: 5 (ref 5–15)
BUN: 12 mg/dL (ref 6–20)
CO2: 26 mmol/L (ref 22–32)
Calcium: 8.5 mg/dL — ABNORMAL LOW (ref 8.9–10.3)
Chloride: 109 mmol/L (ref 98–111)
Creatinine, Ser: 0.93 mg/dL (ref 0.61–1.24)
GFR, Estimated: >60 mL/min (ref >60)
Glucose, Bld: 103 mg/dL — ABNORMAL HIGH (ref 70–99)
Phosphorus: 3.5 mg/dL (ref 2.5–4.6)
Potassium: 3.7 mmol/L (ref 3.5–5.1)
Sodium: 140 mmol/L (ref 135–145)

## 2021-07-24 LAB — COXSACKIE B VIRUS ANTIBODIES
Coxsackie B1 Ab: 1:8 {titer} — ABNORMAL HIGH
Coxsackie B2 Ab: NEGATIVE
Coxsackie B3 Ab: NEGATIVE
Coxsackie B4 Ab: NEGATIVE
Coxsackie B5 Ab: 1:32 {titer} — ABNORMAL HIGH
Coxsackie B6 Ab: 1:8 {titer} — ABNORMAL HIGH

## 2021-07-24 LAB — MISC LABCORP TEST (SEND OUT): Labcorp test code: 9985

## 2021-07-24 LAB — CSF CULTURE W GRAM STAIN: Culture: NO GROWTH

## 2021-07-24 LAB — MAGNESIUM: Magnesium: 1.9 mg/dL (ref 1.7–2.4)

## 2021-07-24 IMAGING — XA IR FLUORO GUIDE SPINAL/SI JT INJ*L*
1 series · 3 of 3 positions shown · non-contrast
Comparison: None.

CLINICAL DATA: 30-year-old male with continued orthostatic headache
status post lumbar puncture 4 days ago presents to IR for lumbar
blood patch. Lumbar puncture was performed at L4-L5.

EXAM:
LUMBAR EPIDURAL BLOOD PATCH

[2d screen save: ir fl guided loc of need · 3 of 3 slices shown]
[im 1/3]
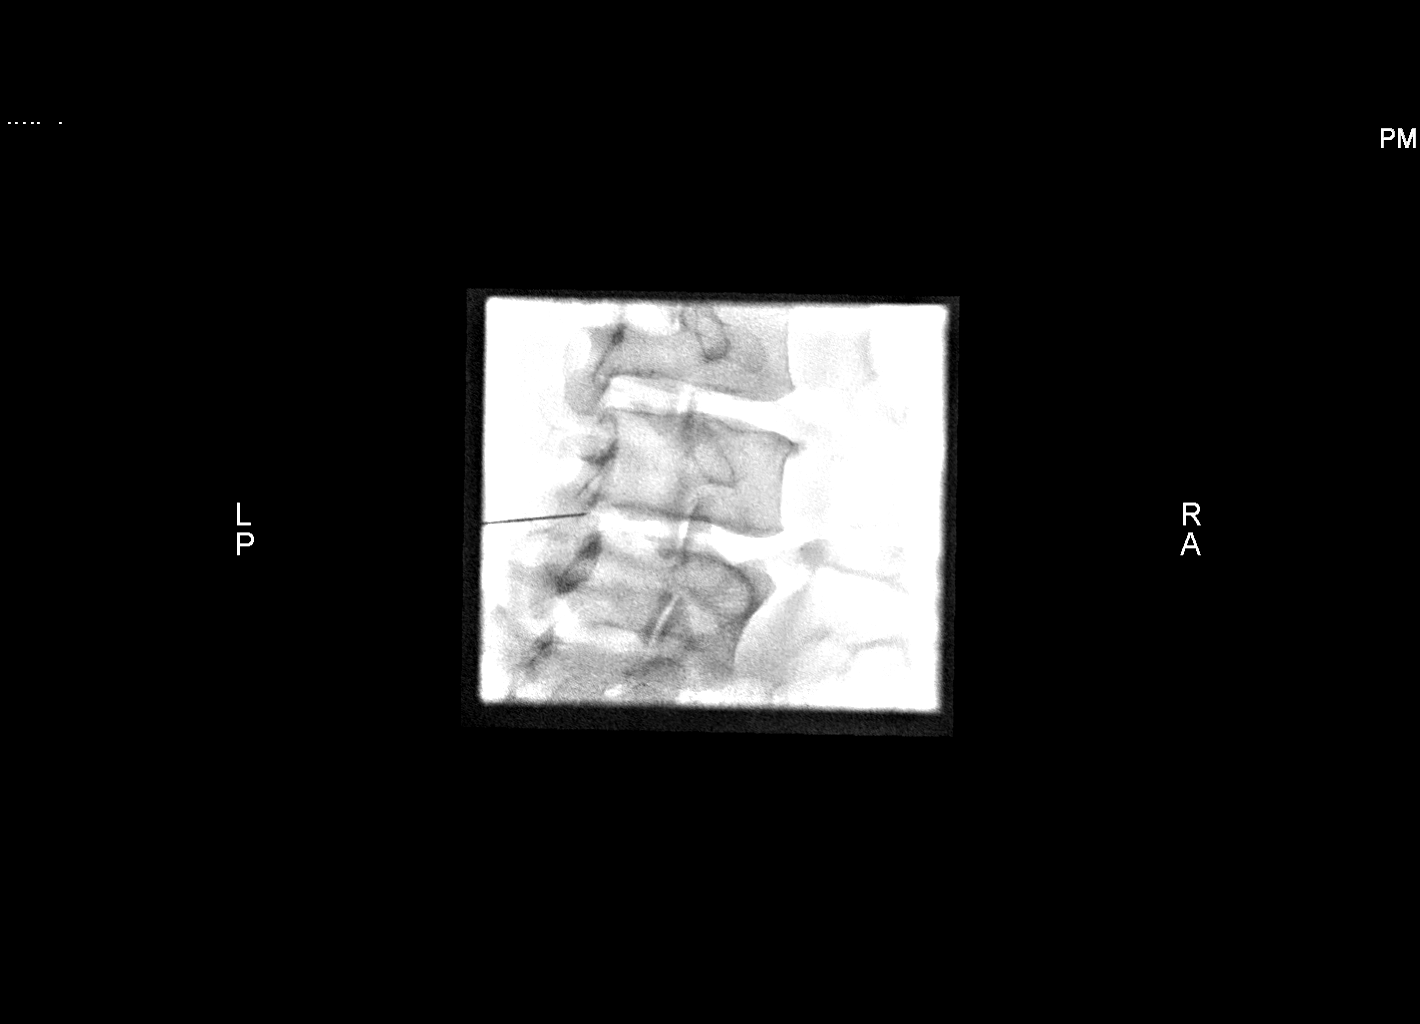
[im 2/3]
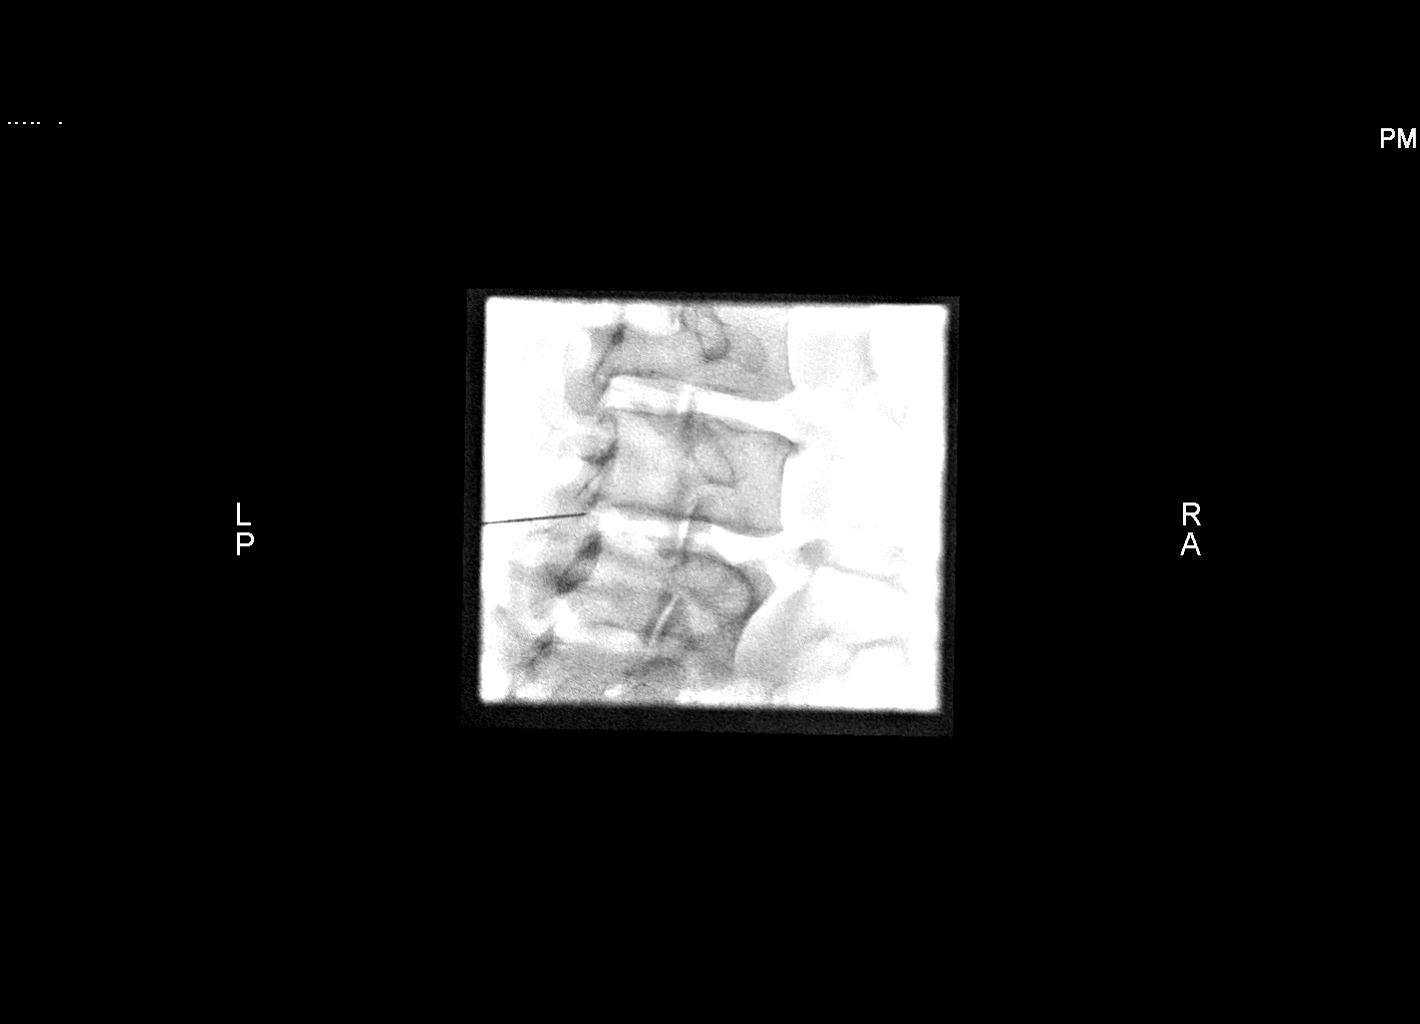
[im 3/3]
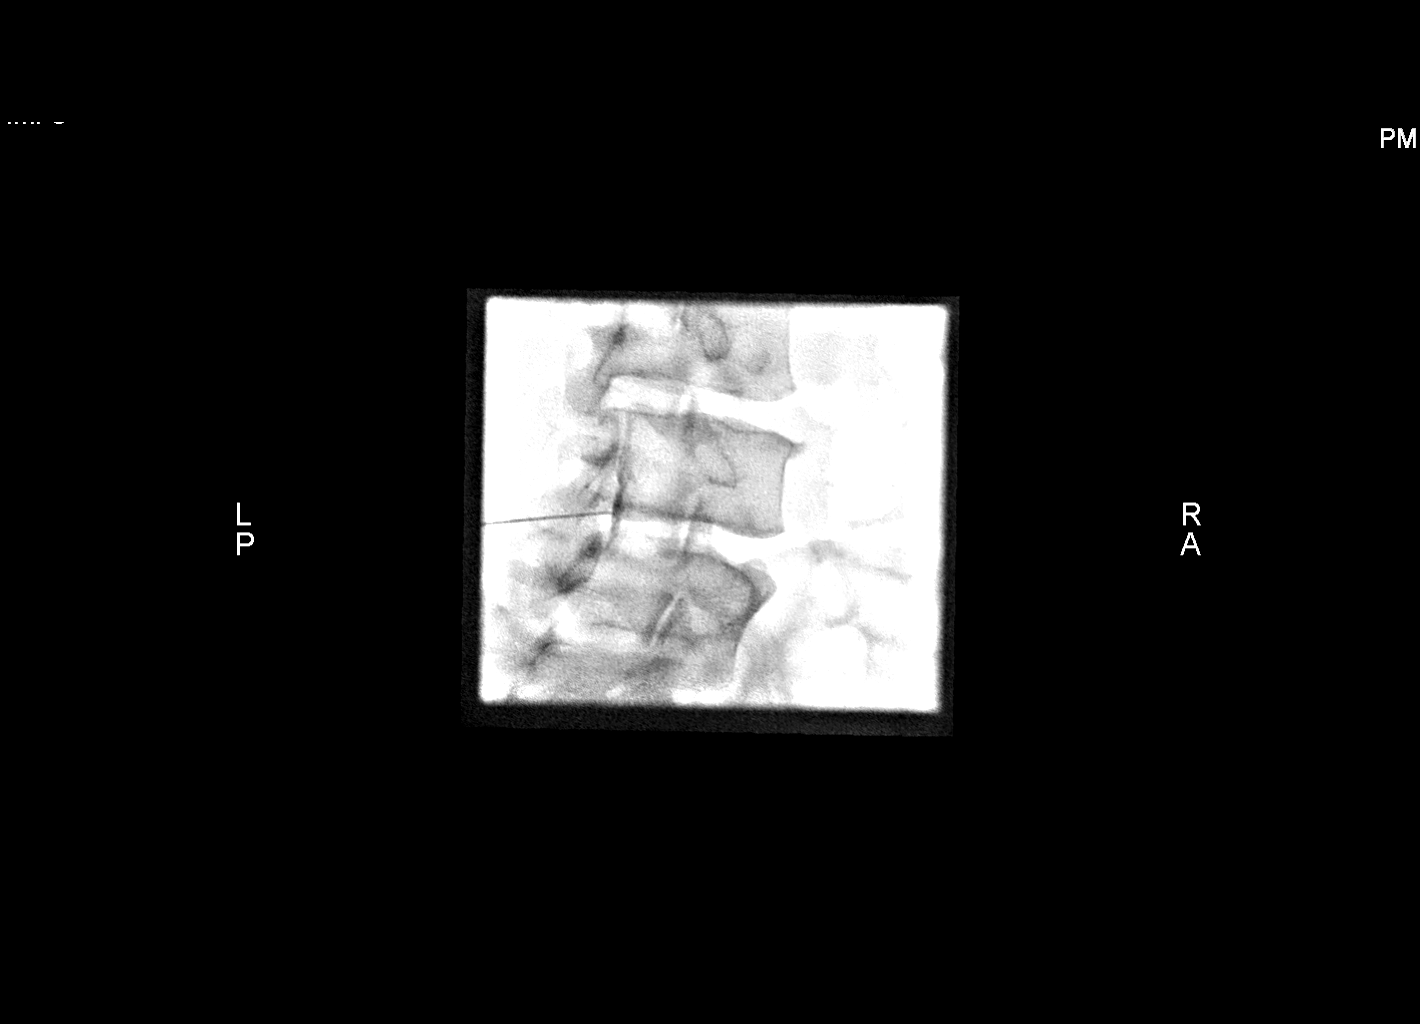

[3 of 3 positions shown; findings below may reference images not displayed]

PROCEDURE:
The procedure, risks, benefits, and alternatives were explained to
the patient. Questions regarding the procedure were encouraged and
answered. The patient understands and consents to the procedure.

The skin at the prior LP site was scrubbed with Betadine and draped
in sterile fashion. Skin anesthesia was carried [DATE]%
Lidocaine. An epidural approach was taken on the left at L4-L5 using
a 20 gauge spinal needle. Epidural positioning was confirmed by
injecting a small amount of Omnipaque 200 M. There was no vascular
communication. 20 cc of the patient's blood was slowly injected into
the epidural space in this location. The procedure was
well-tolerated and the patient was returned to his room in good
condition with instructions to lie down for an additional day.

FLUOROSCOPY:
Radiation Exposure Index (as provided by the fluoroscopic device): 8
mGy Kerma
IMPRESSION: Lumbar epidural blood patch on the right at L4-L5.

## 2021-07-24 MED ORDER — IOPAMIDOL (ISOVUE-M 200) INJECTION 41%
INTRAMUSCULAR | Status: AC
  Administered 2021-07-24: 2 mL via EPIDURAL
  Filled 2021-07-24: qty 10

## 2021-07-24 MED ORDER — SODIUM CHLORIDE (PF) 0.9 % IJ SOLN
INTRAMUSCULAR | Status: AC
  Filled 2021-07-24: qty 10

## 2021-07-24 MED ORDER — LIDOCAINE HCL (PF) 1 % IJ SOLN
INTRAMUSCULAR | Status: AC
  Filled 2021-07-24: qty 30

## 2021-07-24 NOTE — Sedation Documentation (Signed)
20 cc of patient's blood injected as blood patch by Dr. Bryn Gulling. Patient tolerated procedure well.  ?

## 2021-07-24 NOTE — Progress Notes (Addendum)
? ?Progress Note ? ?Patient Name: Dale Macias ?Date of Encounter: 07/24/2021 ? ?[facility] Cardiologist: Dr. Haroldine Laws ? ?Subjective  ? ?No acute overnight events. Patient doing OK this morning. He states he is still getting severe neck/head pain when he stands up but OK laying down. He denies any real chest pain but then mentions a vague mild chest pressure/heaviness that he states feels different than the pain he had the last time he had myopericarditis. No shortness of breath. He is laying completely flat with no problems. ? ?Inpatient Medications  ?  ?Scheduled Meds: ? colchicine  0.6 mg Oral BID  ? ibuprofen  800 mg Oral TID  ? lidocaine  1 patch Transdermal Q24H  ? pantoprazole  40 mg Oral Daily  ? saccharomyces boulardii  250 mg Oral BID  ? sacubitril-valsartan  1 tablet Oral BID  ? ?Continuous Infusions: ? ?PRN Meds: ?butalbital-acetaminophen-caffeine, HYDROmorphone (DILAUDID) injection, methocarbamol, ondansetron **OR** ondansetron (ZOFRAN) IV  ? ?Vital Signs  ?  ?Vitals:  ? 07/23/21 0509 07/23/21 1227 07/23/21 2134 07/24/21 0549  ?BP: 106/68 111/74 117/76 111/67  ?Pulse: 66 74 77 (!) 57  ?Resp: _0 ?Temp: 98.3 ?F (36.8 ?C) 99 ?F (37.2 ?C) 98.7 ?F (37.1 ?C) 98.2 ?F (36.8 ?C)  ?TempSrc: Oral Oral Oral Oral  ?SpO2: 97% 97% 98% 97%  ?Weight:      ?Height:      ? ? ?Intake/Output Summary (Last 24 hours) at 07/24/2021 0744 ?Last data filed at 07/23/2021 2045 ?Gross per 24 hour  ?Intake 360 ml  ?Output --  ?Net 360 ml  ? ? ?  07/20/2021  ?  7:28 PM 06/12/2021  ?  8:46 AM 02/26/2021  ?  2:45 PM  ?Last 3 Weights  ?Weight (lbs) 190 lb 196 lb 9.6 oz 198 lb 6.4 oz  ?Weight (kg) 86.183 kg 89.177 kg 89.994 kg  ?   ? ?Telemetry  ?  ?Sinus rhythm with rates in the 50s to 60s. - Personally Reviewed ? ?ECG  ?  ?No new ECG tracing. - Personally Reviewed ? ?Physical Exam  ? ?GEN: No acute distress.   ?Neck: No JVD. ?Cardiac: RRR. No murmurs, rubs, or gallops. Radial pulses 2+ and equal bilaterally. ?Respiratory:  Clear to auscultation bilaterally. No wheezes, rhonchi, or rales. ?GI: Soft, non-distended, and non-tender. ?MS: No lower extremity edema. No deformity. ?Skin: Warm and dry. ?Neuro:  No focal deficits. ?Psych: Normal affect. Responds appropriately. ? ?Labs  ?  ?High Sensitivity Troponin:   ?Recent Labs  ?Lab 07/21/21 ?1137 07/21/21 ?1317 07/22/21 ?0502 07/23/21 ?4098 07/23/21 ?1191  ?TROPONINIHS 4,782* 2,022* 9,562* 3,609* 3,369*  ?   ?Chemistry ?Recent Labs  ?Lab 07/20/21 ?2045 07/21/21 ?1317 07/23/21 ?0411 07/24/21 ?0423  ?NA 138  --  140 140  ?K 3.8  --  3.9 3.7  ?CL 103  --  112* 109  ?CO2 29  --  26 26  ?GLUCOSE 98  --  100* 103*  ?BUN 16  --  16 12  ?CREATININE 1.12  --  0.93 0.93  ?CALCIUM 9.1  --  8.4* 8.5*  ?MG  --  1.9 2.1 1.9  ?PROT 7.3  --   --   --   ?ALBUMIN 4.1  --  3.3* 3.3*  ?AST 37  --   --   --   ?ALT 35  --   --   --   ?ALKPHOS 49  --   --   --   ?BILITOT 0.4  --   --   --   ?  GFRNONAA >60  --  >60 >60  ?ANIONGAP 6  --  2* 5  ?  ?Lipids No results for input(s): CHOL, TRIG, HDL, LABVLDL, LDLCALC, CHOLHDL in the last 168 hours.  ?Hematology ?Recent Labs  ?Lab 07/22/21 ?0502 07/23/21 ?0411 07/23/21 ?1545  ?WBC 12.6* 6.9 5.3  ?RBC 4.83 4.37 4.88  ?HGB 14.2 12.6* 14.2  ?HCT 42.8 38.5* 43.2  ?MCV 88.6 88.1 88.5  ?MCH 29.4 28.8 29.1  ?MCHC 33.2 32.7 32.9  ?RDW 13.0 12.7 12.7  ?PLT 129* 140* 172  ? ?Thyroid  ?Recent Labs  ?Lab 07/21/21 ?1202  ?TSH 0.974  ?  ?BNPNo results for input(s): BNP, PROBNP in the last 168 hours.  ?DDimer No results for input(s): DDIMER in the last 168 hours.  ? ?Radiology  ?  ?MR CARDIAC MORPHOLOGY W WO CONTRAST ? ?Result Date: 07/22/2021 ?CLINICAL DATA:  Myocarditis EXAM: CARDIAC MRI TECHNIQUE: The patient was scanned on a 1.5 Tesla Siemens magnet. A dedicated cardiac coil was used. Functional imaging was done using Fiesta sequences. 2,3, and 4 chamber views were done to assess for RWMA's. Modified Simpson's rule using a short axis stack was used to calculate an ejection fraction  on a dedicated work Conservation officer, nature. The patient received 8 cc of Gadavist. After 10 minutes inversion recovery sequences were used to assess for infiltration and scar tissue. CONTRAST:  Gadavist FINDINGS: Normal atrial sizes. No PFO / ASD. Normal ascending thoracic aorta 2.9 cm Normal appearing cardiac valves Non rheumatic mild appearing MR Small but nearly circumferential pericardial effusion Gadolinium uptake along the RV/LV free wall pericardium as well as lateral apical LV myocardial wall and septum T1 native 1111 msec elevated ECV: Elevated 46% T2 55 msec Mild LVE with global hypokinesis Quantitative EF 48% (EDV 194 cc ESV 101 cc SV 93 cc ) Mild RVE with global hypokinesis Quantitative EF 42% (EDV 157 cc ESV 92 cc SV 65 cc ) IMPRESSION: 1. Mild LVE global hypokinesis EF 48% 2.  Mild RVE global hypokinesis EF 42% 3. Gadolinium uptake in RV/LV free wall pericardium and septal, apical lateral myocardium Despite normal T2 and normal ESR on labs (CRP is elevated) Findings consistent with recurrent myopericarditis 4.  Elevated native T1 and ECV 5.  Small but nearly circumferential pericardial effusion [place] Electronically Signed   By: Jenkins Rouge M.D.   On: 07/22/2021 14:34   ? ?Cardiac Studies  ? ?Echocardiogram 07/21/2021: ?Impressions: ? 1. Left ventricular ejection fraction, by estimation, is 30 to 35%. The  ?left ventricle has moderately decreased function. The left ventricle  ?demonstrates regional wall motion abnormalities (see scoring  ?diagram/findings for description). There is mild  ?left ventricular hypertrophy. Left ventricular diastolic parameters were  ?normal.  ? 2. Right ventricular systolic function is normal. The right ventricular  ?size is normal. There is normal pulmonary artery systolic pressure. The  ?estimated right ventricular systolic pressure is 16.6 mmHg.  ? 3. The mitral valve is normal in structure. Trivial mitral valve  ?regurgitation. No evidence of mitral  stenosis.  ? 4. The aortic valve is tricuspid. Aortic valve regurgitation is not  ?visualized. No aortic stenosis is present.  ? ?Comparison(s): Prior images reviewed side by side. Lv function is worse  ?from prior.  ?_______________ ? ?Cardiac MRI 07/22/2021: ?Impressions: ?1. Mild LVE global hypokinesis EF 48% ?2.  Mild RVE global hypokinesis EF 42% ?3. Gadolinium uptake in RV/LV free wall pericardium and septal, ?apical lateral myocardium Despite normal T2 and normal ESR on labs ?(CRP  is elevated) Findings consistent with recurrent myopericarditis ?4.  Elevated native T1 and ECV ?5.  Small but nearly circumferential pericardial effusion ?  ?Patient Profile  ?   ?31 y.o. male with a history of myopericarditis from COVID-19 infection in 11/2020 with EF initially as low as 45% with subsequent normalization of EF to 55-60% on repeat Echo in 02/2021 who was admitted with recurrent acute myocarditis on 07/20/2021 after recently traveling to [place] and presenting with diarrhea, fever, and headache. ? ?Assessment & Plan  ?  ?Recurrent Acute Myopericarditis  ?HFrEF ?History of myopericarditis in 11/2020 after COVID-19 infection. EF 45% at that time. Coronary CTA showed coronary calcium of 0 with no CAD. EF ultimately normalized. Now admitted with recurrence.  Echo this admission showed LVEF of 30-35% with hypokinesis of entire apex. CRP elevated but ESR WNL. Cardiac MRI showed mild LV EF of 48% with global hypokinesis and RV EF of 42% with global hypokinesis with gadolinium uptake in the RV/LV free wall pericardium and septal, apical lateral myocardium consistent with recurrent myopericarditis. ID work-up negative for so far for Zika, RMSF, HIV, B burgdorferi, meningitis. Presumed to be viral given recent travel. ?- Euvolemic on exam. ?- Started on Colchicine 0.64m twice daily and Ibuprofen 8023mthree times daily. Continue. Continue PPI with high dose NSAIDs. ?- Started on Entresto [id number] daily.  Continue. ?- Unable to add beta-blocker due to baseline bradycardia. ?- BP soft at times so not started on MRA. ?- No arrhythmias noted on telemetry so outpatient monitor not felt to be needed. ?- Will arrange fo

## 2021-07-24 NOTE — Plan of Care (Signed)
?  Problem: Education: ?Goal: Knowledge of General Education information will improve ?Description: Including pain rating scale, medication(s)/side effects and non-pharmacologic comfort measures ?Outcome: Progressing ?  ?Problem: Clinical Measurements: ?Goal: Cardiovascular complication will be avoided ?Outcome: Progressing ?  ?Problem: Coping: ?Goal: Level of anxiety will decrease ?Outcome: Progressing ?  ?Problem: Safety: ?Goal: Ability to remain free from injury will improve ?Outcome: Progressing ?  ?Problem: Clinical Measurements: ?Goal: Respiratory complications will improve ?Outcome: Completed/Met ?  ?

## 2021-07-24 NOTE — Progress Notes (Signed)
?PROGRESS NOTE ? ?JONATHAN CORPUS ITQ:[id number] DOB: 1990/04/22  ? ?PCP: Pcp, No ? ?Patient is from: Home ? ?DOA: 07/20/2021 LOS: 3 ? ?Chief complaints ?Chief Complaint  ?Patient presents with  ? Torticollis  ? Headache  ?  ? ?Brief Narrative / Interim history: ?31 year old M with PMH of systolic CHF in the setting of myopericarditis in 11/2020 with recovered EF and alcohol abuse presenting with fever, chills, severe headache, neck pain, nausea and intermittent chest pain.  Started on broad-spectrum antibiotics with concern for meningitis.   He had LP with sterile CSF.  Infectious disease consulted.   ? ?Patient also has elevated troponin rising from [phone] in the setting of myopericarditis. TTE with LVEF of 30 to 35%, RWMA.  He underwent cardiac MRI that showed mild LV global hypokinesis with EF of 48%, mild RV global hypokinesis with EF of 42% and gadolinium uptake concerning for myopericarditis and a small but nearly circumferential pericardial effusion.  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 Coxsackie AB IgG positive with low titer but IgM negative.  Coxsackie B virus antibodies.  He is normal ESR, negative ANA and slightly elevated CRP argues against autoimmune or chronic inflammatory process.  Patient remains on ibuprofen and colchicine per cardiology.  Also started on Entresto for acute systolic CHF.  ? ?From infectious standpoint, work-up including blood culture, CSF culture, HIV, Lyme, RMSF, Zika virus IgM, RVP, malaria, COVID-19 and influenza negative.  CSF enteroviral studies and dengue pending.  ? ?Patient has intractable posterior headache concerning for post dural puncture headache.  IR consulted for blood patch.  Neurology consulted as well.  ? ?Subjective: ?Seen and examined earlier this morning.  No major events overnight of this morning.  He reports severe debilitating headache sitting up or standing.  Pain resolved with lying flat.  He has associated  nausea.  No other focal neuro symptoms.  Pain is mildly in his posterior neck and adjacent part of his head. ? ?Objective: ?Vitals:  ? 07/23/21 1227 07/23/21 2134 07/24/21 0549 07/24/21 1228  ?BP: 111/74 117/76 111/67 121/70  ?Pulse: 74 77 (!) 57 71  ?Resp:  18 18 12   ?Temp: 99 ?F (37.2 ?C) 98.7 ?F (37.1 ?C) 98.2 ?F (36.8 ?C) 97.8 ?F (36.6 ?C)  ?TempSrc: Oral Oral Oral Oral  ?SpO2: 97% 98% 97% 97%  ?Weight:      ?Height:      ? ? ?Examination: ? ?GENERAL: No apparent distress.  Nontoxic. ?HEENT: MMM.  Vision and hearing grossly intact.  ?NECK: Supple.  No apparent JVD.  ?RESP:  No IWOB.  Fair aeration bilaterally. ?CVS:  RRR. Heart sounds normal.  ?ABD/GI/GU: BS+. Abd soft, NTND.  ?MSK/EXT:  Moves extremities. No apparent deformity. No edema.  ?SKIN: no apparent skin lesion or wound ?NEURO: Awake and alert. Oriented appropriately.  No apparent focal neuro deficit. ?PSYCH: Calm. Normal affect.  ? ?Procedures:  ?4/25-lumbar puncture ? ?Microbiology summarized: ?COVID-19 and influenza PCR nonreactive. ?Blood cultures NGTD. ?CSF culture NGTD. ? ?Assessment and Plan: ?* SIRS with fever, headache and neck pain ?Patient returned from 10-12-1996 2 weeks prior to admission.  He work-up including blood culture, CSF culture, HIV, Lyme, RMSF, malaria, COVID-19, RVP, Zika virus IgM and influenza negative.  Procalcitonin persistently negative arguing against bacterial infection.  Antibiotics discontinued by ID. ?-ID added enterovirus to CSF to rule out viral meningitis. ? ?Myopericarditis ?Unclear etiology but suspect acute viral illness but seems to be recurrent issue.  Coxsackie a IgG  positive but IgM negative suggesting remote infection.  Normal ESR with negative ANA argues against chronic inflammatory or autoimmune process.  He has mildly elevated CRP suggesting acute inflammatory process.  TTE and cardiac MRI as above.  No significant cardiac history in immediate familys. ?-On colchicine and NSAID per  cardiology ?-Discussed about alcohol cessation ? ?Intractable headache ?Worse with sitting and standing but improves with lying.  Concerning for postdural puncture headache.  No focal neurodeficit. ?-IR consulted for blood patch ?-Neurology consulted as well. ? ?Acute on chronic systolic CHF (congestive heart failure) ([place]) ?Patient with reduced EF in the setting of myopericarditis in 11/2020.  Recovered EF based on TTE on 06/22/2021.  Now with reduced EF on current TTE in the setting of myopericarditis.  Overall, he looks compensated and euvolemic.  He is not on diuretics at home. ?-On Entresto per cardiology. ?-Encourage alcohol cessation ? ?Alcohol abuse ?Patient admits to intermittent binging.  Last drink was on Friday.  No withdrawal symptoms. ?-Encouraged alcohol cessation given his underlying cardiac history. ?-Thiamine, folic acid and multivitamins ?-Closely monitor for withdrawal symptoms ? ?Elevated troponin ?Likely from myopericarditis versus ACS.  Troponin peaked at 4900 and started trending down.  TTE and MRI as above. ?-Cardiac meds as above ? ? ? ?DVT prophylaxis:  ?SCDs Start: 07/21/21 1121 ? ?Code Status: Full code ?Family Communication: Updated patient's wife at bedside. ?Level of care: Telemetry ?Status is: Inpatient ?Remains inpatient appropriate because: Intractable headache likely from lumbar puncture requiring blood patch ? ? ?Final disposition: Likely home on 4/29. ?Consultants:  ?Cardiology ?Infectious disease ?Interventional radiology ?Neurology ? ?Sch Meds:  ?Scheduled Meds: ? colchicine  0.6 mg Oral BID  ? ibuprofen  800 mg Oral TID  ? lidocaine  1 patch Transdermal Q24H  ? pantoprazole  40 mg Oral Daily  ? saccharomyces boulardii  250 mg Oral BID  ? sacubitril-valsartan  1 tablet Oral BID  ? ?Continuous Infusions: ? ? ?PRN Meds:.butalbital-acetaminophen-caffeine, HYDROmorphone (DILAUDID) injection, methocarbamol, ondansetron **OR** ondansetron (ZOFRAN)  IV ? ?Antimicrobials: ?Anti-infectives (From admission, onward)  ? ? Start     Dose/Rate Route Frequency Ordered Stop  ? 07/21/21 1200  doxycycline (VIBRAMYCIN) 100 mg in sodium chloride 0.9 % 250 mL IVPB  Status:  Discontinued       ? 100 mg ?125 mL/hr over 120 Minutes Intravenous 2 times daily 07/21/21 1142 07/23/21 1059  ? 07/21/21 0830  vancomycin (VANCOCIN) IVPB 1000 mg/200 mL premix  Status:  Discontinued       ? 1,000 mg ?200 mL/hr over 60 Minutes Intravenous Every 8 hours 07/21/21 0816 07/21/21 1119  ? 07/21/21 0600  vancomycin (VANCOCIN) IVPB 1000 mg/200 mL premix  Status:  Discontinued       ? 1,000 mg ?200 mL/hr over 60 Minutes Intravenous Every 8 hours 07/20/21 2149 07/20/21 2202  ? 07/20/21 2203  vancomycin (VANCOCIN) 1000 MG powder       ?Note to Pharmacy: April Holding C: cabinet override  ?    07/20/21 2203 07/21/21 1014  ? 07/20/21 2203  vancomycin (VANCOCIN) 500 MG powder       ?Note to Pharmacy: April Holding C: cabinet override  ?    07/20/21 2203 07/21/21 1014  ? 07/20/21 2200  Vancomycin (VANCOCIN) 1,500 mg in sodium chloride 0.9 % 500 mL IVPB  Status:  Discontinued       ? 1,500 mg ?250 mL/hr over 120 Minutes Intravenous Every 12 hours 07/20/21 2045 07/20/21 2046  ? 07/20/21 2100  Vancomycin (VANCOCIN) 1,500 mg  in sodium chloride 0.9 % 500 mL IVPB       ? 1,500 mg ?250 mL/hr over 120 Minutes Intravenous  Once 07/20/21 2046 07/21/21 0131  ? 07/20/21 2045  vancomycin (VANCOCIN) IVPB 1000 mg/200 mL premix  Status:  Discontinued       ? 1,000 mg ?200 mL/hr over 60 Minutes Intravenous  Once 07/20/21 2040 07/20/21 2045  ? 07/20/21 2045  cefTRIAXone (ROCEPHIN) 2 g in sodium chloride 0.9 % 100 mL IVPB  Status:  Discontinued       ? 2 g ?200 mL/hr over 30 Minutes Intravenous Every 12 hours 07/20/21 2040 07/21/21 1119  ? ?  ? ? ? ?I have personally reviewed the following labs and images: ?CBC: ?Recent Labs  ?Lab 07/20/21 ?2045 07/22/21 ?0502 07/23/21 ?0411 07/23/21 ?1545  ?WBC 5.6 12.6* 6.9 5.3   ?NEUTROABS 3.8 9.4*  --  2.3  ?HGB 15.1 14.2 12.6* 14.2  ?HCT 45.2 42.8 38.5* 43.2  ?MCV 86.6 88.6 88.1 88.5  ?PLT 143* 129* 140* 172  ? ?BMP &GFR ?Recent Labs  ?Lab 07/20/21 ?2045 07/21/21 ?1317 07/23/21 ?0411 07/24/21 ?0423  ?NA 138  --  140 140  ?K 3.8

## 2021-07-24 NOTE — Progress Notes (Addendum)
Request received from Dr. Alanda Slim to perform blood patch for pt. Pt had bedside LP in ED 07/20/21. Pt states he has had severe headache since that is relieved by lying flat but so severe with movement and ambulation, he feels nauseous. Medication temporarily relieves pain. Procedure for lumbar blood patch was discussed with patient. Pt verbalized understanding and willing to proceed. Consent signed in chart. Procedure planned later today in IR.  ? ? ?Alex Gardener, AGNP-BC ?07/24/2021, 12:08 PM ? ? ?

## 2021-07-24 NOTE — Consult Note (Addendum)
NEUROLOGY CONSULTATION NOTE  ? ?Date of service: July 24, 2021 ?Patient Name: Dale Macias ?MRN:  [id number] ?DOB:  [birth year] ?Reason for consult: headache ?Requesting Provider: Almon Hercules, MD ?_ _ _   _ __   _ __ _ _  __ __   _ __   __ _ ? ?History of Present Illness  ?Dale Macias is a 31 y.o. male with PMH significant for  has a past medical history of Myocarditis (HCC) (11/2020) and Myopericarditis (11/2020).  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 Had lumber puncture done on 07/20/2021 and since 2 days ago, he describe the headache as shape pain, radiating to his neck area.  Patient talking his chin the pain radiates from his neck area to his lower extremity.Patient states he has had severe headache since that is relieved by lying flat but so severe with movement and ambulation, he feels nauseous. Medication temporarily relieves pain ? ?ROS  ? ?Constitutional Denies weight loss, fever and chills.   ?HEENT Denies changes in vision and hearing.   ?Respiratory Denies SOB and cough.   ?CV Denies palpitations and CP   ?GI Denies abdominal pain, nausea, vomiting and diarrhea.   ?GU Denies dysuria and urinary frequency.   ?MSK Denies myalgia and joint pain.   ?Skin Denies rash and pruritus.   ?Neurological   Sharp pain headache   ?Psychiatric Denies recent changes in mood. Denies anxiety and depression.   ? ?Past History  ? ?Past Medical History:  ?Diagnosis Date  ? Myocarditis (HCC) 11/2020  ? Myopericarditis 11/2020  ? ?History reviewed. No pertinent surgical history. ?History reviewed. No pertinent family history. ?Social History  ? ?Socioeconomic History  ? Marital status: Unknown  ?  Spouse name: Not on file  ? Number of  children: Not on file  ? Years of education: Not on file  ? Highest education level: Not on file  ?Occupational History  ? Not on file  ?Tobacco Use  ? Smoking status: Some Days  ?  Types: Cigarettes  ? Smokeless tobacco: Never  ?Substance and Sexual Activity  ? Alcohol use: Yes  ?  Comment: 4-12 oz nightly  ? Drug use: Never  ? Sexual activity: Not on file  ?Other Topics Concern  ? Not on file  ?Social History Narrative  ? Not on file  ? ?Social Determinants of Health  ? ?Financial Resource Strain: Not on file  ?Food Insecurity: Not on file  ?Transportation Needs: Not on file  ?Physical Activity: Not on file  ?Stress: Not on file  ?Social Connections: Not on file  ? ?Allergies  ?Allergen Reactions  ? Benzonatate Swelling  ?  Throat swelling  ? ? ?Medications  ? ?Medications Prior to Admission  ?Medication Sig Dispense Refill Last Dose  ? acetaminophen (TYLENOL) 500 MG tablet Take 1,000 mg by mouth every 6 (six) hours as needed for mild pain, headache or fever.   Past Week  ?  ? ?Vitals  ? ?Vitals:  ? 07/23/21 1227 07/23/21 2134 07/24/21 0549 07/24/21 1228  ?BP: 111/74 117/76 111/67 121/70  ?Pulse: 74 77 (!) 57 71  ?Resp:  18 18 12   ?Temp: 99 ?F (37.2 ?C) 98.7 ?F (37.1 ?C) 98.2 ?F (  36.8 ?C) 97.8 ?F (36.6 ?C)  ?TempSrc: Oral Oral Oral Oral  ?SpO2: 97% 98% 97% 97%  ?Weight:      ?Height:      ?  ? ?Body mass index is 26.5 kg/m?. ? ?Physical Exam  ? ?General: Laying comfortably in bed; in no acute distress.  ?HENT: Normal oropharynx and mucosa. Normal external appearance of ears and nose.  ?Neck: Supple, no pain or tenderness  ?CV: No JVD. No peripheral edema.  ?Pulmonary: Symmetric Chest rise. Normal respiratory effort.  ?Abdomen: Soft to touch, non-tender.  ?Ext: No cyanosis, edema, or deformity  ?Skin: No rash. Normal palpation of skin.   ?Musculoskeletal: Normal digits and nails by inspection. No clubbing.  ? ?euro: ?Mental Status: ?Patient is awake, alert, oriented to person, place, month, year, and  situation. ?Patient is able to give a clear and coherent history. ?No signs of aphasia or neglect ?Cranial Nerves: ?II: Visual Fields are full. Pupils are equal, round, and reactive to light.   ?III,IV, VI: EOMI without ptosis or diploplia.  ?V: Facial sensation is symmetric to temperature ?VII: Facial movement is symmetric resting and smiling ?VIII: Hearing is intact to voice ?X: Palate elevates symmetrically ?XI: Shoulder shrug is symmetric. ?XII: Tongue protrudes midline without atrophy or fasciculations.  ?Motor: ?Tone is normal. Bulk is normal. 5/5 strength was present in all four extremities.  ?Sensory: ?Sensation is symmetric to light touch and temperature in the arms and legs. No extinction to DSS present.  ?Deep Tendon Reflexes: ?2+ and symmetric in the biceps and patellae.  ?Plantars: ?Toes are downgoing bilaterally.  ?Cerebellar: ?FNF and HKS are intact bilaterally ? ? ? ?Labs  ? ?CBC:  ?Recent Labs  ?Lab 07/22/21 ?0502 07/23/21 ?0411 07/23/21 ?1545  ?WBC 12.6* 6.9 5.3  ?NEUTROABS 9.4*  --  2.3  ?HGB 14.2 12.6* 14.2  ?HCT 42.8 38.5* 43.2  ?MCV 88.6 88.1 88.5  ?PLT 129* 140* 172  ? ? ?Basic Metabolic Panel:  ?Lab Results  ?Component Value Date  ? NA 140 07/24/2021  ? K 3.7 07/24/2021  ? CO2 26 07/24/2021  ? GLUCOSE 103 (H) 07/24/2021  ? BUN 12 07/24/2021  ? CREATININE 0.93 07/24/2021  ? CALCIUM 8.5 (L) 07/24/2021  ? GFRNONAA >60 07/24/2021  ? ?Lipid Panel: No results found for: [place] ?HgbA1c: No results found for: HGBA1C ?Urine Drug Screen:  ?   ?Component Value Date/Time  ? LABOPIA POSITIVE (A) 07/21/2021 1848  ? [place] DETECTED 07/21/2021 1848  ? [place] DETECTED 07/21/2021 1848  ? AMPHETMU NONE DETECTED 07/21/2021 1848  ? [place] DETECTED 07/21/2021 1848  ? [place] DETECTED 07/21/2021 1848  ?  ?Alcohol Level No results found for: ETH ? ?CT Head without contrast(Personally reviewed): ?IMPRESSION: ?Normal study. ?  ?MRI  cardiac  morphology ?CRP is elevated) Findings consistent  with recurrent myopericarditis ? .  Small but nearly circumferential pericardial effusion ? ?Impression  ? ?Dale Macias is a 31 y.o. male with  ? malaise , headache and some chest pain Just returned from 4 day trip to [place] Noted to have elevated troponin now increased to 4892 this am. Previous history of myocarditis in September 2022 At that time troponin peaked at 9714.  TTE had normal EF and MRI with no real gadolinium uptake which is unusual. He suspects he did have mosquito bites while traveling He had negative LP on admission for meningitis but still has a bad headache He is non toxic appearing with normal WBC and currently  no fever. Telemetry with no arrhythmias ID suggested a course of oral doxycycline  ?Pt states he has had severe headache since that is relieved by lying flat but so severe with movement and ambulation, he feels nauseous. Medication temporarily relieves pain ?Recommendations  ?- Recommend Blood patch ? ?______________________________________________________________________ ? ? ?Thank you for the opportunity to take part in the care of this patient. If you have any further questions, please contact the neurology consultation attending. ? ?Patient seen by NP/Neuro and later by MD. Note/plan to be edited by MD as needed.  ?Veronica Uloko,FNP-BC ?Stroke Neurology ?07/24/2021 ?2:15 PM ? ?_ _ _   _ __   _ __ _ _  __ __   _ __   __ _  ? ?ATTENDING NOTE: ?I reviewed above note and agree with the assessment and plan. Pt was seen and examined.  ? ?31 year old male with history of CHF secondary to myocarditis and mild pericarditis in 11/2020 with normalized EF in 05/2021 admitted for headache, neck pain, fever, chills, chest pain, nausea and diarrhea.  Initially concerning for meningitis, ID on board, had LP done on 4/24, not consistent with CNS infection.  Antibiotic now discontinued.  However, patient complaining of positional headache.  Per patient, his headache on presentation was  more dull headache, however since yesterday, while he got up to go to bathroom he had sharp pain in the back of head, radiating to the neck and down to spine.  When he lies down, headache resolves.  Patient symptoms concer

## 2021-07-24 NOTE — Procedures (Signed)
Interventional Radiology Procedure Note ? ?Procedure: Lumbar blood patch ? ?Indication: CSF Leak s/p Lumbar Puncture. ? ?Findings:  ?Epidural access obtained at L4-L5 ?20 mL of patient's blood injected into the epidural space ? ?Please refer to procedural dictation for full description. ? ?Complications: None ? ?EBL: Minimal ? ?Acquanetta Belling, MD ?[phone] ? ? ?

## 2021-07-25 ENCOUNTER — Telehealth: Payer: Self-pay | Admitting: Medical

## 2021-07-25 DIAGNOSIS — R519 Headache, unspecified: Secondary | ICD-10-CM | POA: Diagnosis not present

## 2021-07-25 DIAGNOSIS — M542 Cervicalgia: Secondary | ICD-10-CM

## 2021-07-25 DIAGNOSIS — I5023 Acute on chronic systolic (congestive) heart failure: Secondary | ICD-10-CM | POA: Diagnosis not present

## 2021-07-25 DIAGNOSIS — R6510 Systemic inflammatory response syndrome (SIRS) of non-infectious origin without acute organ dysfunction: Secondary | ICD-10-CM | POA: Diagnosis not present

## 2021-07-25 DIAGNOSIS — G971 Other reaction to spinal and lumbar puncture: Secondary | ICD-10-CM

## 2021-07-25 DIAGNOSIS — I319 Disease of pericardium, unspecified: Secondary | ICD-10-CM | POA: Diagnosis not present

## 2021-07-25 MED ORDER — IBUPROFEN 800 MG PO TABS: 800.0000 mg | ORAL_TABLET | Freq: Three times a day (TID) | ORAL | 0 refills | Status: DC

## 2021-07-25 MED ORDER — SACUBITRIL-VALSARTAN 24-26 MG PO TABS: 1.0000 | ORAL_TABLET | Freq: Two times a day (BID) | ORAL | 1 refills | Status: DC

## 2021-07-25 MED ORDER — COLCHICINE 0.6 MG PO TABS: 0.6000 mg | ORAL_TABLET | Freq: Two times a day (BID) | ORAL | 1 refills | Status: DC

## 2021-07-25 MED ORDER — PANTOPRAZOLE SODIUM 40 MG PO TBEC: 40.0000 mg | DELAYED_RELEASE_TABLET | Freq: Every day | ORAL | 1 refills | Status: DC

## 2021-07-25 NOTE — Telephone Encounter (Signed)
? ?  Patient called the after hours line to report an issue with his entresto which was prescribed at discharge from [facility] today for management of his CHF. I called [facility] and provided information regarding a free 30-day coupon card. He will need a prior authorization going forward for further refills which can be addressed at his upcoming visit with [facility] 08/03/21. He was appreciative of the assistance.  ? ?Beatriz Stallion, PA-C ?07/25/21; 12:26 PM ? ?

## 2021-07-25 NOTE — Discharge Summary (Signed)
? ?Physician Discharge Summary  ?Dale Macias HFS:[id number] DOB: [birth year] DOA: 07/20/2021 ? ?PCP: Pcp, No ? ?Admit date: 07/20/2021 ?Discharge date: 07/25/2021 ?Admitted From: Home. ?Disposition: Home ?Recommendations for Outpatient Follow-up:  ?Follow ups as below. ?Please obtain CBC/BMP/Mag at follow up ?Please follow up on the following pending results: CSF enterovirus and dengue labs ? ?Home Health: Not indicated ?Equipment/Devices: Not indicated ? ?Discharge Condition: Stable ?CODE STATUS: Full code ? Follow-up Information   ? ? [facility] Follow up.   ?Specialty: Cardiology ?Why: Hospital follow-up in Dr. Clayborne Dana office scheduled for 08/03/2021 at 9:30am. Please arrive 15 minutes early for check-in. If this date/time does not work for you, please call our office to reschedule. ?Contact information: ?[address] ?[id number] mc ?[place] ?[phone] ? ?  ?  ? ?  ?  ? ?  ? ? ?Hospital course ?31 year old M with PMH of systolic CHF in the setting of myopericarditis in 11/2020 with recovered EF and alcohol abuse presenting with fever, chills, severe headache, neck pain, nausea and intermittent chest pain.  Started on broad-spectrum antibiotics with concern for meningitis.   He had LP with sterile CSF.  Infectious disease consulted.   ? ?Patient also has elevated troponin rising from [phone] in the setting of myopericarditis. TTE with LVEF of 30 to 35%, RWMA.  He underwent cardiac MRI that showed mild LV global hypokinesis with EF of 48%, mild RV global hypokinesis with EF of 42% and gadolinium uptake concerning for myopericarditis and a small but nearly circumferential pericardial effusion.  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 Coxsackie A IgG positive with low titer but IgM negative. He is normal ESR, negative ANA and slightly elevated CRP argues against autoimmune or chronic  inflammatory process.  Patient remains on ibuprofen and colchicine per cardiology.  Also started on Entresto for acute systolic CHF.  ? ?From infectious standpoint, work-up including blood culture, CSF culture, HIV, Lyme, RMSF, Zika virus IgM, RVP, malaria, COVID-19 and influenza negative.  CSF enteroviral studies and dengue pending.  ? ?Patient had intractable headache concerning for post dural puncture headache that has resolved with blood patch.  ? ?Patient was cleared for discharge by cardiology and ID.  He is discharged on colchicine, Advil and Entresto per cardiology.  Outpatient follow-up with cardiology on 08/03/2021.  ? ?See individual problem list below for more on hospital course. ? ?Problems addressed during this hospitalization ?Problem  ?SIRS with fever, headache and neck pain  ?Myopericarditis  ?Intractable Headache  ?Acute On Chronic Systolic Chf (Congestive Heart Failure) (Hcc)  ?Elevated Troponin  ?Alcohol Abuse  ?Postdural Puncture Headache  ?Headache  ?Febrile Illness  ?  ?Assessment and Plan: ?* SIRS with fever, headache and neck pain ?Patient returned from [place] 2 weeks prior to admission.  Infectious work-up including blood culture, CSF culture, HIV, Lyme, RMSF, malaria, COVID-19, RVP, Zika virus IgM and influenza negative.  Procalcitonin persistently negative arguing against bacterial infection.  Antibiotics discontinued by ID.  No further fever.  Cleared for discharge by ID ?-Follow enterovirus and dengue labs. ? ?Myopericarditis ?Unclear etiology but suspect acute viral illness but seems to be recurrent issue.  Coxsackie a IgG positive but IgM negative suggesting remote infection.  Normal ESR with negative ANA argues against chronic inflammatory or autoimmune process.  He has mildly elevated CRP suggesting acute inflammatory process.  TTE and cardiac MRI as above.  No significant cardiac history in immediate  familys. ?-Discharged on colchicine, ibuprofen and Entresto per  cardiology. ?-Discussed about alcohol cessation ?-Outpatient follow-up with his cardiologist on 08/03/2021 ? ?Intractable headache ?Likely postdural puncture headache.  No focal neurodeficit.  Resolved after blood patch. ?-Appreciate help by neurology and IR ? ?Acute on chronic systolic CHF (congestive heart failure) ([place]) ?Patient with reduced EF in the setting of myopericarditis in 11/2020.  Recovered EF based on TTE on 06/22/2021.  Now with reduced EF on current TTE in the setting of myopericarditis.  Overall, he looks compensated and euvolemic.  Not on diuretics. ?-Discharged on Entresto for outpatient follow-up. ?-Encouraged alcohol cessation ?-Advised the importance of sodium and fluid restriction ? ?Alcohol abuse ?Patient admits to intermittent binging.  Last drink was on Friday.  No withdrawal symptoms. ?-Encouraged alcohol cessation given his underlying cardiac history. ? ?Elevated troponin ?Likely from myopericarditis versus ACS.  Troponin peaked at 4900 and started trending down.  TTE and MRI as above. ?-Cardiac meds as above ? ? ? ? ?  ?  ?  ?  ? ?  ? ?Vital signs ?Vitals:  ? 07/24/21 1530 07/24/21 1608 07/24/21 2049 07/25/21 0423  ?BP: 110/76 125/73 119/64 (!) 104/56  ?Pulse: (!) 59 65 70 (!) 54  ?Temp:  98 ?F (36.7 ?C) 98.7 ?F (37.1 ?C) 98.2 ?F (36.8 ?C)  ?Resp: $Remov'17 20 18 18  'GfmtqS$ ?Height:      ?Weight:      ?SpO2: 100% 98% 97% 97%  ?TempSrc:   Oral Oral  ?BMI (Calculated):      ?  ? ?Discharge exam ? ?GENERAL: No apparent distress.  Nontoxic. ?HEENT: MMM.  Vision and hearing grossly intact.  ?NECK: Supple.  No apparent JVD.  ?RESP:  No IWOB.  Fair aeration bilaterally. ?CVS:  RRR. Heart sounds normal.  ?ABD/GI/GU: BS+. Abd soft, NTND.  ?MSK/EXT:  Moves extremities. No apparent deformity. No edema.  ?SKIN: no apparent skin lesion or wound ?NEURO: Awake and alert. Oriented appropriately.  No apparent focal neuro deficit. ?PSYCH: Calm. Normal affect.  ? ?Discharge Instructions ?Discharge Instructions   ? ? (HEART  FAILURE PATIENTS) Call MD:  Anytime you have any of the following symptoms: 1) 3 pound weight gain in 24 hours or 5 pounds in 1 week 2) shortness of breath, with or without a dry hacking cough 3) swelling in the hands, feet or stomach 4) if you have to sleep on extra pillows at night in order to breathe.   Complete by: As directed ?  ? Call MD for:  difficulty breathing, headache or visual disturbances   Complete by: As directed ?  ? Call MD for:  extreme fatigue   Complete by: As directed ?  ? Call MD for:  severe uncontrolled pain   Complete by: As directed ?  ? Diet - low sodium heart healthy   Complete by: As directed ?  ? Discharge instructions   Complete by: As directed ?  ? It has been a pleasure taking care of you! ? ?You were hospitalized due to severe headache and myopericarditis (inflammation of the heart).  We believe this is due to unidentified viral illness. We have started you on medication for myopericarditis.  ? ?As a result of myopericarditis, you you have heart failure with reduced ejection fraction.  You have been started on Entresto for this. ? ?Please follow-up with your cardiologist per their recommendation. ? ?In regards to your headache, initially there was concern about meningitis.  However, the tests we have done excluded bacterial meningitis.  You also had post lumbar puncture headache for which you have been treated with blood patch, and your headache improved.  ? ?Review your new medication list and the directions on your medications before you take them. ? ?In addition to taking your medications as prescribed, we also recommend you avoid alcohol or over-the-counter pain medication other than plain Tylenol, limit the amount of water/fluid you drink to less than 6 cups (1500 cc) a day,  limit your sodium (salt) intake to less than 2 g (2000 mg) a day and weigh yourself daily at the same time and keeping your weight log.   ? ? ?Take care,  ? Increase activity slowly   Complete by: As  directed ?  ? ?  ? ?Allergies as of 07/25/2021   ? ?   Reactions  ? Benzonatate Swelling  ? Throat swelling  ? ?  ? ?  ?Medication List  ?  ? ?TAKE these medications   ? ?acetaminophen 500 MG tablet ?Commonly known as: TYLE

## 2021-07-25 NOTE — Plan of Care (Signed)
?  Problem: Education: ?Goal: Knowledge of General Education information will improve ?Description: Including pain rating scale, medication(s)/side effects and non-pharmacologic comfort measures ?Outcome: Adequate for Discharge ?  ?Problem: Clinical Measurements: ?Goal: Diagnostic test results will improve ?Outcome: Adequate for Discharge ?  ?Problem: Activity: ?Goal: Risk for activity intolerance will decrease ?Outcome: Adequate for Discharge ?  ?

## 2021-07-26 LAB — CULTURE, BLOOD (ROUTINE X 2)
Culture: NO GROWTH
Culture: NO GROWTH
Special Requests: ADEQUATE
Special Requests: ADEQUATE

## 2021-07-26 NOTE — Telephone Encounter (Signed)
Error

## 2021-07-28 LAB — PARASITE EXAM, BLOOD

## 2021-07-29 LAB — MISC LABCORP TEST (SEND OUT): Labcorp test code: 9985

## 2021-07-31 NOTE — Progress Notes (Signed)
?Advanced Heart Failure Clinic Note  ? ?PCP: Pcp, No ?HF Cardiologist: Dr. Haroldine Laws ? ?HPI: ?Dale Macias is a 31 y.o. male with no significant PMHx.  ?  ?Admitted 9/22 with acute myopericarditis, likely secondary to COVID infection 1 month prior (had the virus x 3 times, unvaccinated). BP initially low and given IVF, resulting in volume status being up requiring a dose of IV lasix 120 mg. Echo with EF 45%. Started on colchicine and NSAIDs. cMRI showed LV EF 49%, normal RV, no myocardial LGE but mildly elevated T2 signal anterior wall and appears to be LGE in the pericardium adjacent to the anterior wall. ? ?Follow up 12/22 had been off meds x 1 month, but doing well with no symptoms so decision made to not restart meds and could follow up as needed. ? ?Echo 12/22 EF 55-60% ? ?Seen 3/23 for acute visit with complaints of fatigue and decreased exercise endurance. Drinking more ETOH and not sleeping well. Labs unremarkable and Echo arranged, showing EF 55-60%, normal RV. ? ?Admitted 4/23 with fever, chills, neck pain and CP; concern for meningitis due to recent travel to [place] LP unremarkable, required blood patch later for intractable headache. Echo showed EF of 30 to 35%, RWMA. Underwent cMRI showing mild LV global hypokinesis with EF of 48%, mild RV global hypokinesis with EF of 42% and gadolinium uptake consistent  with recurrent myopericarditis, and small circumferential pericardial effusion. ANA and ESR normal. ID consulted and felt myopericarditis to be viral. Started on ibuprofen and colchicine per cardiology.  Entresto started, no other GDMT due to soft BP and bradycardia. Troponin elevated 244>4900, felt due to myopericarditis so no cath. ? ?Today he returns for post hospital HF follow up. Overall feeling fine. Mild SOB walking up inclines. Denies palpitations, CP, dizziness, edema, or PND/Orthopnea. Appetite ok. No fever or chills. Taking all medications.  ? ? ?Cardiac Studies ? ?- cMRI  (4/23): LVEF 48%, RVEF 42%, findings consistent with recurrent myopericarditis, small circumferential pericardial effusion ? ?- Echo (4/23): EF 30-35%, LV moderately decreased, RV ok ? ?- Echo (3/23): EF 55-60%, RV ok ? ?- Echo (12/22): EF 55-60% ? ?- Echo (9/22): EF 45% ? ?- cMRI (9/22):LV EF 49%, normal RV, no myocardial LGE but mildly elevated T2 signal anterior wall and appears to be LGE in the pericardium adjacent to the anterior wall. ? ?Past Medical History:  ?Diagnosis Date  ? Myocarditis ([place]) 11/2020  ? Myopericarditis 11/2020  ? ?Current Outpatient Medications  ?Medication Sig Dispense Refill  ? acetaminophen (TYLENOL) 500 MG tablet Take 1,000 mg by mouth every 6 (six) hours as needed for mild pain, headache or fever.    ? colchicine 0.6 MG tablet Take 1 tablet (0.6 mg total) by mouth 2 (two) times daily. 60 tablet 1  ? pantoprazole (PROTONIX) 40 MG tablet Take 1 tablet (40 mg total) by mouth daily. 30 tablet 1  ? sacubitril-valsartan (ENTRESTO) 24-26 MG Take 1 tablet by mouth 2 (two) times daily. 60 tablet 1  ? ?No current facility-administered medications for this encounter.  ? ?Allergies  ?Allergen Reactions  ? Benzonatate Swelling  ?  Throat swelling  ? ?Social History  ? ?Socioeconomic History  ? Marital status: Unknown  ?  Spouse name: Not on file  ? Number of children: Not on file  ? Years of education: Not on file  ? Highest education level: Not on file  ?Occupational History  ? Not on file  ?Tobacco Use  ? Smoking status: Some Days  ?  Types: Cigarettes  ? Smokeless tobacco: Never  ?Substance and Sexual Activity  ? Alcohol use: Yes  ?  Comment: 4-12 oz nightly  ? Drug use: Never  ? Sexual activity: Not on file  ?Other Topics Concern  ? Not on file  ?Social History Narrative  ? Not on file  ? ?Social Determinants of Health  ? ?Financial Resource Strain: Not on file  ?Food Insecurity: Not on file  ?Transportation Needs: Not on file  ?Physical Activity: Not on file  ?Stress: Not on file  ?Social  Connections: Not on file  ?Intimate Partner Violence: Not on file  ? ?No family history on file. ? ?BP 126/78   Pulse 72   Wt 87.6 kg (193 lb 3.2 oz)   SpO2 99%   BMI 26.95 kg/m?  ? ?Wt Readings from Last 3 Encounters:  ?08/03/21 87.6 kg (193 lb 3.2 oz)  ?07/20/21 86.2 kg (190 lb)  ?06/12/21 89.2 kg (196 lb 9.6 oz)  ? ?PHYSICAL EXAM: ?General:  NAD. No resp difficulty ?HEENT: Normal ?Neck: Supple. No JVD. Carotids 2+ bilat; no bruits. No lymphadenopathy or thryomegaly appreciated. ?Cor: PMI nondisplaced. Regular rate & rhythm. No rubs, gallops or murmurs. ?Lungs: Clear ?Abdomen: Soft, nontender, nondistended. No hepatosplenomegaly. No bruits or masses. Good bowel sounds. ?Extremities: No cyanosis, clubbing, rash, edema ?Neuro: Alert & oriented x 3, cranial nerves grossly intact. Moves all 4 extremities w/o difficulty. Affect pleasant. ? ?ECG: NSR 63 bpm (personally reviewed) ? ?ASSESSMENT & PLAN" ?1. H/o myopericarditis:  ?- h/o elevated trop 2/2 myopericarditis 9/22. ?- cMRI (9/22) with LVEF 49%, normal RV, no myocardial LGE but mildly elevated T2 signal anterior wall and appears to be LGE in the pericardium adjacent to the anterior wall.  ?- Coronary CT no CAD. + PNA   ?- Echo (12/22): EF improved 55-60% ?- Echo (3/23): EF 55-60%, RV ok ?- Readmitted 4/23 with myopericarditis after traveling to [place] ?- Echo (4/23): EF 30-35%, LV moderately decreased, RV ok ?- cMRI (4/23): LVEF 48%, RVEF 42%, findings consistent with recurrent myopericarditis, small circumferential pericardial effusion. ?- NYHA II, volume looks ok. ?- Start spiro 12.5 mg daily. ?- Continue colchicine x 3 months. ?- Continue Entresto 24/26 mg bid. ?- Add beta blocker and Farxiga next. ?- BMET and CBC today, repeat BMET in 1 week ?- Plan to repeat echo in 3 months. ? ?2. Pericardial effusion ?- Small circumferential on cMRI 4/23. ?- Repeat ltd echo in 6 weeks to follow. Discussed with Dr. Haroldine Laws ? ?3. ETOH use ?- No longer  drinking ETOH. ?- Congratulated on cessation. ? ?4. HTN ?- BP ok today. ?- Add GDMT as able. ? ?5. Tobacco Use ?- No longer smoking. ?- Congratulated on cessation. ? ?RTW: He works as a Chief Strategy Officer. We discussed not lifting or physically exerting himself. He needs to take breaks or days off as needed. I strongly encouraged him to take 3-4 weeks to rest, then consider going back to work if he feels up to it. Also discussed not heavy lifting or cardio in the gym. He says he will think about it. ? ?Follow up with PharmD in 3 weeks (add low-dose carvedilol or SGLT2i), APP in 6 weeks and Dr. Haroldine Laws in 12 weeks + echo. ? ?Rafael Bihari, FNP ?08/03/21  ?

## 2021-08-03 ENCOUNTER — Ambulatory Visit (HOSPITAL_COMMUNITY)
Admit: 2021-08-03 | Discharge: 2021-08-03 | Disposition: A | Discharge status: Home / Self Care | Payer: BC Managed Care – PPO | Attending: Internal Medicine | Admitting: Internal Medicine

## 2021-08-03 ENCOUNTER — Encounter (HOSPITAL_COMMUNITY): Payer: Self-pay

## 2021-08-03 VITALS — BP 126/78 | HR 72 | Wt 193.2 lb

## 2021-08-03 DIAGNOSIS — F1010 Alcohol abuse, uncomplicated: Secondary | ICD-10-CM | POA: Diagnosis not present

## 2021-08-03 DIAGNOSIS — F1090 Alcohol use, unspecified, uncomplicated: Secondary | ICD-10-CM | POA: Insufficient documentation

## 2021-08-03 DIAGNOSIS — I5023 Acute on chronic systolic (congestive) heart failure: Secondary | ICD-10-CM

## 2021-08-03 DIAGNOSIS — Z87891 Personal history of nicotine dependence: Secondary | ICD-10-CM | POA: Insufficient documentation

## 2021-08-03 DIAGNOSIS — I509 Heart failure, unspecified: Secondary | ICD-10-CM | POA: Insufficient documentation

## 2021-08-03 DIAGNOSIS — Z720 Tobacco use: Secondary | ICD-10-CM

## 2021-08-03 DIAGNOSIS — I514 Myocarditis, unspecified: Secondary | ICD-10-CM

## 2021-08-03 DIAGNOSIS — I3139 Other pericardial effusion (noninflammatory): Secondary | ICD-10-CM | POA: Insufficient documentation

## 2021-08-03 DIAGNOSIS — J189 Pneumonia, unspecified organism: Secondary | ICD-10-CM | POA: Insufficient documentation

## 2021-08-03 DIAGNOSIS — I110 Hypertensive heart disease with heart failure: Secondary | ICD-10-CM | POA: Diagnosis not present

## 2021-08-03 DIAGNOSIS — Z8616 Personal history of COVID-19: Secondary | ICD-10-CM | POA: Insufficient documentation

## 2021-08-03 DIAGNOSIS — I10 Essential (primary) hypertension: Secondary | ICD-10-CM

## 2021-08-03 DIAGNOSIS — I309 Acute pericarditis, unspecified: Secondary | ICD-10-CM | POA: Diagnosis present

## 2021-08-03 LAB — CBC
HCT: 45.4 % (ref 39.0–52.0)
Hemoglobin: 15.5 g/dL (ref 13.0–17.0)
MCH: 29.3 pg (ref 26.0–34.0)
MCHC: 34.1 g/dL (ref 30.0–36.0)
MCV: 85.8 fL (ref 80.0–100.0)
Platelets: 254 10*3/uL (ref 150–400)
RBC: 5.29 MIL/uL (ref 4.22–5.81)
RDW: 12.5 % (ref 11.5–15.5)
WBC: 6.4 10*3/uL (ref 4.0–10.5)
nRBC: 0 % (ref 0.0–0.2)

## 2021-08-03 LAB — BASIC METABOLIC PANEL
Anion gap: 9 (ref 5–15)
BUN: 12 mg/dL (ref 6–20)
CO2: 28 mmol/L (ref 22–32)
Calcium: 9.7 mg/dL (ref 8.9–10.3)
Chloride: 102 mmol/L (ref 98–111)
Creatinine, Ser: 1.08 mg/dL (ref 0.61–1.24)
GFR, Estimated: >60 mL/min (ref >60)
Glucose, Bld: 102 mg/dL — ABNORMAL HIGH (ref 70–99)
Potassium: 4.5 mmol/L (ref 3.5–5.1)
Sodium: 139 mmol/L (ref 135–145)

## 2021-08-03 MED ORDER — SPIRONOLACTONE 25 MG PO TABS: 12.5000 mg | ORAL_TABLET | Freq: Every day | ORAL | 0 refills | Status: DC

## 2021-08-03 NOTE — Patient Instructions (Addendum)
Thank you for coming in today  ? ?Labs were done today, if any labs are abnormal the clinic will call you ?No news is good news ? ?START Spirolactone 12.5 mg 1/2 tablet daily  ? ?Your physician recommends that you return for lab work in: 1 week ? ?Your physician recommends that you schedule a follow-up appointment in:  ?6 week limited echocardiogram ?3 weeks with Pharmacy ?6 week in clinic  ?12 weeks with Dr. Gala Romney with echocardiogram ? ?Your physician has requested that you have an echocardiogram. Echocardiography is a painless test that uses sound waves to create images of your heart. It provides your doctor with information about the size and shape of your heart and how well your heart?s chambers and valves are working. This procedure takes approximately one hour. There are no restrictions for this procedure. ?  ?At the [facility], you and your health needs are our priority. As part of our continuing mission to provide you with exceptional heart care, we have created designated Provider Care Teams. These Care Teams include your primary Cardiologist (physician) and Advanced Practice Providers (APPs- Physician Assistants and Nurse Practitioners) who all work together to provide you with the care you need, when you need it.  ? ?You may see any of the following providers on your designated Care Team at your next follow up: ?Dr Arvilla Meres ?Dr Marca Ancona ?Tonye Becket, NP ?Robbie Lis, PA ?Jessica Milford,NP ?Anna Genre, PA ?Karle Plumber, PharmD ? ? ?Please be sure to bring in all your medications bottles to every appointment.  ? ?If you have any questions or concerns before your next appointment please send [place] a message through [place] or call our office at [phone].   ? ?TO LEAVE A MESSAGE FOR THE NURSE SELECT OPTION 2, PLEASE LEAVE A MESSAGE INCLUDING: ?YOUR NAME ?DATE OF BIRTH ?CALL BACK NUMBER ?REASON FOR CALL**this is important as we prioritize the call backs ? ?YOU WILL  RECEIVE A CALL BACK THE SAME DAY AS LONG AS YOU CALL BEFORE 4:00 PM ? ?

## 2021-08-05 ENCOUNTER — Telehealth (HOSPITAL_COMMUNITY): Payer: Self-pay | Admitting: Family Medicine

## 2021-08-05 NOTE — Telephone Encounter (Signed)
Pt wife stated she and husband has left 4 messages regarding depression, blood clots,  and pt is not eating as much, signs of no joy and emotions is out of control, Please call patient asap. Thanks ?

## 2021-08-07 NOTE — Telephone Encounter (Signed)
Pt was seen in office this week, no VM have been left on triage line, attempted to call pt to f/u and Left message to call back   ?

## 2021-08-10 ENCOUNTER — Ambulatory Visit (HOSPITAL_COMMUNITY)
Admission: RE | Admit: 2021-08-10 | Discharge: 2021-08-10 | Disposition: A | Discharge status: Home / Self Care | Payer: BC Managed Care – PPO | Source: Ambulatory Visit | Attending: Internal Medicine | Admitting: Internal Medicine

## 2021-08-10 DIAGNOSIS — I5023 Acute on chronic systolic (congestive) heart failure: Secondary | ICD-10-CM

## 2021-08-10 LAB — BASIC METABOLIC PANEL
Anion gap: 7 (ref 5–15)
BUN: 14 mg/dL (ref 6–20)
CO2: 26 mmol/L (ref 22–32)
Calcium: 9.2 mg/dL (ref 8.9–10.3)
Chloride: 104 mmol/L (ref 98–111)
Creatinine, Ser: 1.18 mg/dL (ref 0.61–1.24)
GFR, Estimated: >60 mL/min (ref >60)
Glucose, Bld: 94 mg/dL (ref 70–99)
Potassium: 4.2 mmol/L (ref 3.5–5.1)
Sodium: 137 mmol/L (ref 135–145)

## 2021-08-10 NOTE — Progress Notes (Signed)
Advanced Heart Failure Clinic Note   PCP: Pcp, No HF Cardiologist: Dr. Haroldine Laws  HPI:  Dale Macias is a 31 y.o. male with no significant PMHx.    Admitted 11/2020 with acute myopericarditis, likely secondary to COVID infection 1 month prior (had the virus x 3 times, unvaccinated). BP initially low and given IVF, resulting in volume status being up requiring a dose of IV Lasix 120 mg. Echo with EF 45%. Started on colchicine and NSAIDs. cMRI showed LV EF 49%, normal RV, no myocardial LGE but mildly elevated T2 signal anterior wall and appears to be LGE in the pericardium adjacent to the anterior wall.   Follow up 02/2021 had been off meds x 1 month, but doing well with no symptoms so decision made to not restart meds and could follow up as needed.   Echo 02/2021 EF 55-60%   Seen 05/2021 for acute visit with complaints of fatigue and decreased exercise endurance. Drinking more ETOH and not sleeping well. Labs unremarkable and Echo arranged, showing EF 55-60%, normal RV.   Admitted 06/2021 with fever, chills, neck pain and CP; concern for meningitis due to recent travel to [place] LP unremarkable, required blood patch later for intractable headache. Echo showed EF of 30 to 35%, RWMA. Underwent cMRI showing mild LV global hypokinesis with EF of 48%, mild RV global hypokinesis with EF of 42% and gadolinium uptake consistent  with recurrent myopericarditis, and small circumferential pericardial effusion. ANA and ESR normal. ID consulted and felt myopericarditis to be viral. Started on ibuprofen and colchicine per cardiology.  Entresto started, no other GDMT due to soft BP and bradycardia. Troponin elevated 244>4900, felt due to myopericarditis so no cath.   Returned to HF Clinic for post hospital HF follow up 08/03/21. Overall was feeling fine. Noted mild SOB walking up inclines. Denied palpitations, CP, dizziness, edema, or PND/Orthopnea. Appetite was ok. No fever or chills. Reported  taking all medications.    Today he returns to HF clinic for pharmacist medication titration. At last visit with MD spironolactone 12.5 mg daily was initiated. Overall feeling ok today. Notes he has good and bad days. On the bad days, he complains of getting SOB and exhausted very easily. He gets dizzy when he stands up too fast or when bending over. Also, notes his head gets "foggy". During these episodes, his BP remains stable at ~125/80-90s. On his good days, he can walk on flat ground without issues. Says he went to [facility] and walked around all day without problems. Can only walk 50 yards uphill without getting SOB. Notes he has been very moody as well. Says his wife pointed it out but now he has also noticed this. Believes it is related to stress at work. States that things that would normally not bother him he now gets worked up about very easily. He will focus on it and it "eats him alive". Weight at home has been stable. He does not need a diuretic. No LEE, PND or orthopnea.     HF Medications: Entresto 24/26 mg BID Spironolactone 12.5 mg daily  Has the patient been experiencing any side effects to the medications prescribed?  no  Does the patient have any problems obtaining medications due to transportation or finances?   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 Patient understands to avoid decongestants.    Pertinent Lab Values: 08/10/21: Serum creatinine 1.18, BUN  14, Potassium 4.2, Sodium 137  Vital Signs: Weight: 193 lbs (last clinic weight: 193.2 lbs) Blood pressure: 126/68  Heart rate: 82   Assessment/Plan: 1. H/o myopericarditis:  - h/o elevated trop 2/2 myopericarditis 11/2020. - cMRI (11/2020) with LVEF 49%, normal RV, no myocardial LGE but mildly elevated T2 signal anterior wall and appears to be LGE in the pericardium adjacent to the anterior  wall.  - Coronary CT no CAD. + PNA   - Echo (02/2021): EF improved 55-60% - Echo (05/2021): EF 55-60%, RV ok - Readmitted 06/2021 with myopericarditis after traveling to [place] - Echo (0/3546): EF 30-35%, LV moderately decreased, RV ok - cMRI (06/2021): LVEF 48%, RVEF 42%, findings consistent with recurrent myopericarditis, small circumferential pericardial effusion. - NYHA II, euvolemic on exam - Start bisoprolol 2.5 mg daily. Tolerated well previously.  - Continue spironolactone 12.5 mg daily. - Continue Entresto 24/26 mg BID. - Continue colchicine x 3 months. - Plan to repeat echo 10/2021   2. Pericardial effusion - Small circumferential on cMRI 06/2021. - Repeat ltd echo 08/2021   3. ETOH use - No longer drinking ETOH. - Congratulated on cessation.   4. HTN - BP ok today. - start bisoprolol as above   5. Tobacco Use - No longer smoking. - Congratulated on cessation.    Follow up 3 weeks with [place], PharmD, BCPS, BCCP, CPP Heart Failure Clinic Pharmacist [phone]

## 2021-08-25 ENCOUNTER — Ambulatory Visit (HOSPITAL_COMMUNITY)
Admission: RE | Admit: 2021-08-25 | Discharge: 2021-08-25 | Disposition: A | Discharge status: Home / Self Care | Payer: BC Managed Care – PPO | Source: Ambulatory Visit | Attending: Cardiology | Admitting: Cardiology

## 2021-08-25 VITALS — BP 126/68 | HR 82 | Wt 193.0 lb

## 2021-08-25 DIAGNOSIS — Z8616 Personal history of COVID-19: Secondary | ICD-10-CM | POA: Diagnosis not present

## 2021-08-25 DIAGNOSIS — I5022 Chronic systolic (congestive) heart failure: Secondary | ICD-10-CM

## 2021-08-25 DIAGNOSIS — I319 Disease of pericardium, unspecified: Secondary | ICD-10-CM | POA: Insufficient documentation

## 2021-08-25 DIAGNOSIS — I10 Essential (primary) hypertension: Secondary | ICD-10-CM | POA: Insufficient documentation

## 2021-08-25 DIAGNOSIS — Z79899 Other long term (current) drug therapy: Secondary | ICD-10-CM | POA: Insufficient documentation

## 2021-08-25 DIAGNOSIS — Z87891 Personal history of nicotine dependence: Secondary | ICD-10-CM | POA: Diagnosis not present

## 2021-08-25 DIAGNOSIS — Z5181 Encounter for therapeutic drug level monitoring: Secondary | ICD-10-CM | POA: Diagnosis present

## 2021-08-25 DIAGNOSIS — I3139 Other pericardial effusion (noninflammatory): Secondary | ICD-10-CM | POA: Insufficient documentation

## 2021-08-25 MED ORDER — BISOPROLOL FUMARATE 5 MG PO TABS: 2.5000 mg | ORAL_TABLET | Freq: Every day | ORAL | 5 refills | Status: DC

## 2021-08-25 NOTE — Patient Instructions (Signed)
It was a pleasure seeing you today!  MEDICATIONS: -We are changing your medications today -Start bisoprolol 2.5 mg (1/2 tablet) daily -Call if you have questions about your medications.   NEXT APPOINTMENT: Return to clinic in 3 weeks with APP Clinic.  In general, to take care of your heart failure: -Limit your fluid intake to 2 Liters (half-gallon) per day.   -Limit your salt intake to ideally 2-3 grams (2000-3000 mg) per day. -Weigh yourself daily and record, and bring that "weight diary" to your next appointment.  (Weight gain of 2-3 pounds in 1 day typically means fluid weight.) -The medications for your heart are to help your heart and help you live longer.   -Please contact us before stopping any of your heart medications.  Call the clinic at [phone] with questions or to reschedule future appointments.

## 2021-09-09 ENCOUNTER — Other Ambulatory Visit (HOSPITAL_COMMUNITY): Payer: Self-pay | Admitting: *Deleted

## 2021-09-09 ENCOUNTER — Telehealth (HOSPITAL_COMMUNITY): Payer: Self-pay | Admitting: Internal Medicine

## 2021-09-09 ENCOUNTER — Telehealth (HOSPITAL_COMMUNITY): Payer: Self-pay | Admitting: Pharmacy Technician

## 2021-09-09 MED ORDER — SACUBITRIL-VALSARTAN 24-26 MG PO TABS
1.0000 | ORAL_TABLET | Freq: Two times a day (BID) | ORAL | 6 refills | Status: DC
Start: 2021-09-09 — End: 2023-06-03

## 2021-09-09 NOTE — Telephone Encounter (Signed)
Pt need refills on entresto, pharmacy need approval, [facility],  pt [phone]

## 2021-09-09 NOTE — Telephone Encounter (Signed)
Patient Advocate Encounter   Received notification from [employer] that prior authorization for Sherryll Burger is required.   PA submitted on CoverMyMeds Key [id number] Status is pending   Will continue to follow.

## 2021-09-10 NOTE — Telephone Encounter (Signed)
Advanced Heart Failure Patient Advocate Encounter  Prior Authorization for Sherryll Burger has been approved.    PA# [id number] Effective dates: 09/09/21 through 09/10/22  Archer Asa, CPhT

## 2021-09-14 ENCOUNTER — Encounter (HOSPITAL_COMMUNITY): Payer: Self-pay

## 2021-09-14 ENCOUNTER — Ambulatory Visit (HOSPITAL_BASED_OUTPATIENT_CLINIC_OR_DEPARTMENT_OTHER)
Admission: RE | Admit: 2021-09-14 | Discharge: 2021-09-14 | Disposition: A | Discharge status: Home / Self Care | Payer: BC Managed Care – PPO | Source: Ambulatory Visit

## 2021-09-14 ENCOUNTER — Other Ambulatory Visit (HOSPITAL_COMMUNITY): Payer: Self-pay | Admitting: Family Medicine

## 2021-09-14 ENCOUNTER — Ambulatory Visit (HOSPITAL_COMMUNITY)
Admission: RE | Admit: 2021-09-14 | Discharge: 2021-09-14 | Disposition: A | Discharge status: Home / Self Care | Payer: BC Managed Care – PPO | Source: Ambulatory Visit | Attending: Family Medicine | Admitting: Family Medicine

## 2021-09-14 VITALS — BP 110/78 | HR 70 | Wt 193.8 lb

## 2021-09-14 DIAGNOSIS — R400 Somnolence: Secondary | ICD-10-CM

## 2021-09-14 DIAGNOSIS — I5023 Acute on chronic systolic (congestive) heart failure: Secondary | ICD-10-CM

## 2021-09-14 DIAGNOSIS — I5022 Chronic systolic (congestive) heart failure: Secondary | ICD-10-CM | POA: Diagnosis not present

## 2021-09-14 DIAGNOSIS — Z87891 Personal history of nicotine dependence: Secondary | ICD-10-CM | POA: Insufficient documentation

## 2021-09-14 DIAGNOSIS — F1010 Alcohol abuse, uncomplicated: Secondary | ICD-10-CM | POA: Diagnosis not present

## 2021-09-14 DIAGNOSIS — I10 Essential (primary) hypertension: Secondary | ICD-10-CM

## 2021-09-14 DIAGNOSIS — I110 Hypertensive heart disease with heart failure: Secondary | ICD-10-CM | POA: Insufficient documentation

## 2021-09-14 DIAGNOSIS — I3139 Other pericardial effusion (noninflammatory): Secondary | ICD-10-CM | POA: Diagnosis not present

## 2021-09-14 DIAGNOSIS — Z79899 Other long term (current) drug therapy: Secondary | ICD-10-CM | POA: Insufficient documentation

## 2021-09-14 DIAGNOSIS — F419 Anxiety disorder, unspecified: Secondary | ICD-10-CM | POA: Diagnosis not present

## 2021-09-14 DIAGNOSIS — Z720 Tobacco use: Secondary | ICD-10-CM

## 2021-09-14 LAB — BASIC METABOLIC PANEL
Anion gap: 12 (ref 5–15)
BUN: 12 mg/dL (ref 6–20)
CO2: 29 mmol/L (ref 22–32)
Calcium: 9.6 mg/dL (ref 8.9–10.3)
Chloride: 99 mmol/L (ref 98–111)
Creatinine, Ser: 1.2 mg/dL (ref 0.61–1.24)
GFR, Estimated: >60 mL/min (ref >60)
Glucose, Bld: 100 mg/dL — ABNORMAL HIGH (ref 70–99)
Potassium: 4.9 mmol/L (ref 3.5–5.1)
Sodium: 140 mmol/L (ref 135–145)

## 2021-09-14 LAB — ECHOCARDIOGRAM LIMITED
Area-P 1/2: 4.31 cm2
Calc EF: 49.7 %
S' Lateral: 3.5 cm
Single Plane A2C EF: 49.4 %
Single Plane A4C EF: 53.5 %

## 2021-09-14 NOTE — Progress Notes (Signed)
Heart and Vascular Care Navigation  09/14/2021  Dale Macias [birth year] [id number]  Reason for Referral: anxiety/depression   Engaged with patient face to face for initial visit for Heart and Vascular Care Coordination.                                                                                                   Assessment:   CSW met with pt to discuss above concerns.  Pt reports that since his recent hospital stay he has increased anxiety and reports depressive symptoms- trouble motivating himself to do things.  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  He has begun to implement boundaries by turning his phone off at a certain time or not answering correspondence on weekends but finds himself up in the middle of the night thinking about projects and unable to fall back asleep.  Pt reports when this happens he tries to watch tv to distract himself but when he goes back to bed the thoughts just come back- he has also tried meditation before with the same results as it is hard to turn off his thoughts.    Reports that he did see a counselor years ago for 6-7 sessions and this would help for a day or so then he would return to negative and recurrent thoughts.  Is willing to try counseling again and discuss medication to assist with stress.                                   HRT/VAS Care Coordination     Living arrangements for the past 2 months Single Family Home   Lives with: Spouse; Minor Children   Patient Current Librarian, academic   Patient Has Concern With Paying Medical Bills No   Does Patient Have Prescription Coverage? Yes   Home Assistive Devices/Equipment None       Social History:                                                                             SDOH Screenings   Alcohol Screen: Not on file  Depression (QXI5-0): Not on file   Financial Resource Strain: Not on file  Food Insecurity: Not on file  Housing: Not on file  Physical Activity: Not on file  Social Connections: Not on file  Stress: Not on file  Tobacco Use: High Risk (09/14/2021)   Patient History    Smoking Tobacco Use: Some Days    Smokeless Tobacco Use: Never    Passive Exposure: Not on file  Transportation Needs: Not on file  Other Care Navigation Interventions:     Inpatient/Outpatient Substance Abuse Counseling/Rehab Options N/a  Provided Pharmacy assistance resources N/a  Patient expressed Mental Health concerns yes  Patient Referred to: Elias Else   Follow-up plan:    Referral sent to [facility] with [facility]- they will reach out to pt for assessment/intake.  Provided pt with list of other providers and encouraged him to call insurance to discuss in network providers.  Will continue to follow and assist as needed  Jorge Ny, [facility] Desk#: [phone] Cell#: [phone]

## 2021-09-14 NOTE — Progress Notes (Signed)
ITAMAR home sleep study given to patient, all instructions explained, and CLOUDPAT registration complete.  

## 2021-09-14 NOTE — Progress Notes (Addendum)
Advanced Heart Failure Clinic Note   PCP: Pcp, No HF Cardiologist: Dr. Haroldine Laws  HPI: Dale Macias is a 31 y.o. male with no significant PMHx.    Admitted 9/22 with acute myopericarditis, likely secondary to COVID infection 1 month prior (had the virus x 3 times, unvaccinated). BP initially low and given IVF, resulting in volume status being up requiring a dose of IV lasix 120 mg. Echo with EF 45%. Started on colchicine and NSAIDs. cMRI showed LV EF 49%, normal RV, no myocardial LGE but mildly elevated T2 signal anterior wall and appears to be LGE in the pericardium adjacent to the anterior wall.  Follow up 12/22 had been off meds x 1 month, but doing well with no symptoms so decision made to not restart meds and could follow up as needed.  Echo 12/22 EF 55-60%.  Seen 3/23 for acute visit with complaints of fatigue and decreased exercise endurance. Drinking more ETOH and not sleeping well. Labs unremarkable and Echo arranged, showing EF 55-60%, normal RV.  Admitted 4/23 with fever, chills, neck pain and CP; concern for meningitis due to recent travel to [place] LP unremarkable, required blood patch later for intractable headache. Echo showed EF of 30 to 35%, RWMA. Underwent cMRI showing mild LV global hypokinesis with EF of 48%, mild RV global hypokinesis with EF of 42% and gadolinium uptake consistent  with recurrent myopericarditis, and small circumferential pericardial effusion. ANA and ESR normal. ID consulted and felt myopericarditis to be viral. Started on ibuprofen and colchicine per cardiology.  Entresto started, no other GDMT due to soft BP and bradycardia. Troponin elevated 244>4900, felt due to myopericarditis so no cath.  Today he returns for HF follow up. Overall feeling "hit or miss". Some days feels good, some days fatigued and unmotivated. Sleepy during the day, does not think he snores. 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 Also, dizzy bending over at times. Occasional left chest pains, he attributes this to gas. Denies palpitations, edema, or PND/Orthopnea. Appetite ok. No fever or chills. Weight at home 190 pounds. Taking all medications. BP at home ~ 115/79. No longer drinking ETOH or smoking cigars. Asking if anxiety may be to blame for current issues.  Ltd echo today (09/14/21) to evaluate pericardial effusion, results pending.  Cardiac Studies  - cMRI (4/23): LVEF 48%, RVEF 42%, findings consistent with recurrent myopericarditis, small circumferential pericardial effusion  - Echo (4/23): EF 30-35%, LV moderately decreased, RV ok  - Echo (3/23): EF 55-60%, RV ok  - Echo (12/22): EF 55-60%  - Echo (9/22): EF 45%  - cMRI (9/22):LV EF 49%, normal RV, no myocardial LGE but mildly elevated T2 signal anterior wall and appears to be LGE in the pericardium adjacent to the anterior wall.  Past Medical History:  Diagnosis Date   Myocarditis ([place]) 11/2020   Myopericarditis 11/2020   Current Outpatient Medications  Medication Sig Dispense Refill   acetaminophen (TYLENOL) 500 MG tablet Take 1,000 mg by mouth every 6 (six) hours as needed for mild pain, headache or fever.     bisoprolol (ZEBETA) 5 MG tablet Take 0.5 tablets (2.5 mg total) by mouth daily. 15 tablet 5   colchicine 0.6 MG tablet Take 1 tablet (0.6 mg total) by mouth 2 (two) times daily. 60 tablet 1   pantoprazole (PROTONIX) 40 MG tablet Take 1 tablet (40 mg total) by mouth daily. 30 tablet 1   sacubitril-valsartan (ENTRESTO) 24-26 MG Take 1 tablet by  mouth 2 (two) times daily. 60 tablet 6   spironolactone (ALDACTONE) 25 MG tablet Take 0.5 tablets (12.5 mg total) by mouth daily. 90 tablet 0   No current facility-administered medications for this encounter.   Allergies  Allergen Reactions   Benzonatate Swelling    Throat swelling   Social History   Socioeconomic History   Marital status: Unknown    Spouse name: Not on file    Number of children: Not on file   Years of education: Not on file   Highest education level: Not on file  Occupational History   Not on file  Tobacco Use   Smoking status: Some Days    Types: Cigarettes   Smokeless tobacco: Never  Substance and Sexual Activity   Alcohol use: Yes    Comment: 4-12 oz nightly   Drug use: Never   Sexual activity: Not on file  Other Topics Concern   Not on file  Social History Narrative   Not on file   Social Determinants of Health   Financial Resource Strain: Not on file  Food Insecurity: Not on file  Transportation Needs: Not on file  Physical Activity: Not on file  Stress: Not on file  Social Connections: Not on file  Intimate Partner Violence: Not on file   No family history on file.  BP 110/78   Pulse 70   Wt 87.9 kg (193 lb 12.8 oz)   SpO2 96%   BMI 27.03 kg/m   Wt Readings from Last 3 Encounters:  09/14/21 87.9 kg (193 lb 12.8 oz)  08/25/21 87.5 kg (193 lb)  08/03/21 87.6 kg (193 lb 3.2 oz)   PHYSICAL EXAM: General:  NAD. No resp difficulty, mildly anxious HEENT: Normal Neck: Supple. No JVD. Carotids 2+ bilat; no bruits. No lymphadenopathy or thryomegaly appreciated. Cor: PMI nondisplaced. Regular rate & rhythm. No rubs, gallops or murmurs. Lungs: Clear Abdomen: Soft, nontender, nondistended. No hepatosplenomegaly. No bruits or masses. Good bowel sounds. Extremities: No cyanosis, clubbing, rash, edema Neuro: Alert & oriented x 3, cranial nerves grossly intact. Moves all 4 extremities w/o difficulty. Affect pleasant.  ASSESSMENT & PLAN" 1. H/o myopericarditis:  - h/o elevated trop 2/2 myopericarditis 9/22. - cMRI (9/22) with LVEF 49%, normal RV, no myocardial LGE but mildly elevated T2 signal anterior wall and appears to be LGE in the pericardium adjacent to the anterior wall.  - Coronary CT no CAD. + PNA   - Echo (12/22): EF improved 55-60% - Echo (3/23): EF 55-60%, RV ok - Readmitted 4/23 with myopericarditis after  traveling to [place] - Echo (6/97): EF 30-35%, LV moderately decreased, RV ok - cMRI (4/23): LVEF 48%, RVEF 42%, findings consistent with recurrent myopericarditis, small circumferential pericardial effusion. - NYHA II, current symptoms out of proportion to exam findings. Volume good today. - Will hold off on SGLT2i with dizziness & fatigue. - Continue spiro 12.5 mg daily. - Continue colchicine x 3 months. - Continue Entresto 24/26 mg bid. - Continue bisoprolol 2.5 mg daily. - Repeat echo next visit. - Consider CPX down the road. - Labs today.  2. Pericardial effusion - Small circumferential on cMRI 4/23. - Repeat ltd echo today to evaluate, results pending.   3. ETOH use - No longer drinking ETOH. - Congratulated on cessation.  4. HTN - Well-controlled on current meds. - No changes.  5. Tobacco Use - No longer smoking. - Congratulated on cessation.  6. Anxiety - Suspect this is contributing. - We discussed medication and counseling.  -  He is agreeable to referral, will engage HFSW  7. Daytime sleepiness - Arrange home sleep study  Follow up in 2 months with Dr. Haroldine Laws + echo, as scheduled.  [place], FNP 09/14/21

## 2021-09-14 NOTE — Patient Instructions (Addendum)
Thank you for coming in today  Labs were done today, if any labs are abnormal the clinic will call you No news is good news  Your physician recommends that you schedule a follow-up appointment in:  Keep follow up appointment with Dr. Luanna Cole will be referred to counseling and will be discussed on the steps for making a appointment   You have been set up for a home sleep study and will be contacted on when to do the sleep study  At the Advanced [facility] Failure Clinic, you and your health needs are our priority. As part of our continuing mission to provide you with exceptional heart care, we have created designated Provider Care Teams. These Care Teams include your primary Cardiologist (physician) and Advanced Practice Providers (APPs- Physician Assistants and Nurse Practitioners) who all work together to provide you with the care you need, when you need it.   You may see any of the following providers on your designated Care Team at your next follow up: Dr Arvilla Meres Dr Carron Curie, NP Robbie Lis, [place] [facility] [place], [place] Karle Plumber, PharmD   Please be sure to bring in all your medications bottles to every appointment.   If you have any questions or concerns before your next appointment please send [place] a message through [place] or call our office at [phone].    TO LEAVE A MESSAGE FOR THE NURSE SELECT OPTION 2, PLEASE LEAVE A MESSAGE INCLUDING: YOUR NAME DATE OF BIRTH CALL BACK NUMBER REASON FOR CALL**this is important as we prioritize the call backs  YOU WILL RECEIVE A CALL BACK THE SAME DAY AS LONG AS YOU CALL BEFORE 4:00 PM

## 2021-09-15 ENCOUNTER — Encounter (HOSPITAL_COMMUNITY): Payer: Self-pay | Admitting: *Deleted

## 2021-09-16 ENCOUNTER — Telehealth (HOSPITAL_COMMUNITY): Payer: Self-pay | Admitting: Surgery

## 2021-09-16 ENCOUNTER — Encounter (INDEPENDENT_AMBULATORY_CARE_PROVIDER_SITE_OTHER): Payer: BC Managed Care – PPO | Admitting: Cardiology

## 2021-09-16 DIAGNOSIS — G4733 Obstructive sleep apnea (adult) (pediatric): Secondary | ICD-10-CM

## 2021-09-16 NOTE — Telephone Encounter (Signed)
I called patient to let him know that he can proceed to complete his ordered home sleep study and that insurance prior Berkley Harvey is not needed.  He tells me that he will do the study within the next several nights.

## 2021-09-17 NOTE — Procedures (Signed)
   SLEEP STUDY REPORT Patient Information Study Date: 09/16/21 Patient Name: Dale Macias Patient ID: [id number] Birth Date: 1991/03/07 Age: 31 Gender: Male Referring Physician: Arvilla Meres, MD, Prince Rome, NP  TEST DESCRIPTION: Home sleep apnea testing was completed using the WatchPat, a Type 1 device, utilizing  peripheral arterial tonometry (PAT), chest movement, actigraphy, pulse oximetry, pulse rate, body position and snore.  AHI was calculated with apnea and hypopnea using valid sleep time as the denominator. RDI includes apneas,  hypopneas, and RERAs. The data acquired and the scoring of sleep and all associated events were performed in  accordance with the recommended standards and specifications as outlined in the AASM Manual for the Scoring of  Sleep and Associated Events 2.2.0 (2015).   FINDINGS:  1. Mild Obstructive Sleep Apnea with AHI 14.2/hr.  2. No Central Sleep Apnea with pAHIc 4.2/hr.  3. Oxygen desaturations as low as 82%.  4. Severe snoring was present. O2 sats were < 88% for 2.5 min.  5. Total sleep time was 4 hrs and 34 min.  6. 20.7% of total sleep time was spent in REM sleep.  7. Normal sleep onset latency at 16 min.  8. Prolonged REM sleep onset latency at 101 min.  9. Total awakenings were 21.   DIAGNOSIS: Mild Obstructive Sleep Apnea (G47.33)  RECOMMENDATIONS: 1. Clinical correlation of these findings is necessary. The decision to treat obstructive sleep apnea (OSA) is usually  based on the presence of apnea symptoms or the presence of associated medical conditions such as Hypertension,  Congestive Heart Failure, Atrial Fibrillation or Obesity. The most common symptoms of OSA are snoring, gasping for  breath while sleeping, daytime sleepiness and fatigue.   2. Initiating apnea therapy is recommended given the presence of symptoms and/or associated conditions.  Recommend proceeding with one of the following:   a. Auto-CPAP therapy with a  pressure range of 5-20cm H2O.   b. An oral appliance (OA) that can be obtained from certain dentists with expertise in sleep medicine. These are  primarily of use in non-obese patients with mild and moderate disease.   c. An ENT consultation which may be useful to look for specific causes of obstruction and possible treatment  options.   d. If patient is intolerant to PAP therapy, consider referral to ENT for evaluation for hypoglossal nerve stimulator.   3. Close follow-up is necessary to ensure success with CPAP or oral appliance therapy for maximum benefit .  4. A follow-up oximetry study on CPAP is recommended to assess the adequacy of therapy and determine the need  for supplemental oxygen or the potential need for Bi-level therapy. An arterial blood gas to determine the adequacy of  baseline ventilation and oxygenation should also be considered.  5. Healthy sleep recommendations include: adequate nightly sleep (normal 7-9 hrs/night), avoidance of caffeine after  noon and alcohol near bedtime, and maintaining a sleep environment that is cool, dark and quiet.  6. Weight loss for overweight patients is recommended. Even modest amounts of weight loss can significantly  improve the severity of sleep apnea.  7. Snoring recommendations include: weight loss where appropriate, side sleeping, and avoidance of alcohol before  bed.  8. Operation of motor vehicle should be avoided when sleepy.  Signature: Electronically Signed: 09/17/21 Armanda Magic, MD; [facility]; Diplomat, American Board of  Sleep Medicine

## 2021-09-18 ENCOUNTER — Ambulatory Visit: Payer: BC Managed Care – PPO

## 2021-09-18 ENCOUNTER — Other Ambulatory Visit: Payer: Self-pay

## 2021-09-18 DIAGNOSIS — G4733 Obstructive sleep apnea (adult) (pediatric): Secondary | ICD-10-CM

## 2021-09-23 ENCOUNTER — Telehealth: Payer: Self-pay | Admitting: *Deleted

## 2021-09-23 DIAGNOSIS — R400 Somnolence: Secondary | ICD-10-CM

## 2021-09-23 DIAGNOSIS — G4733 Obstructive sleep apnea (adult) (pediatric): Secondary | ICD-10-CM

## 2021-09-23 DIAGNOSIS — I5022 Chronic systolic (congestive) heart failure: Secondary | ICD-10-CM

## 2021-09-23 DIAGNOSIS — I10 Essential (primary) hypertension: Secondary | ICD-10-CM

## 2021-09-23 NOTE — Telephone Encounter (Addendum)
The patient has been notified of the result.   Left detailed message on voicemail and informed patient to call back.Dale Macias, CMA 08/06/2021 12:08 PM     The patient does not agree with the result because he says he never slept and he would like to do a repeat home study.

## 2021-09-23 NOTE — Telephone Encounter (Signed)
Per Dr Mayford Knife, NPSG order placed .

## 2021-09-23 NOTE — Telephone Encounter (Signed)
-----   Message from Gaynelle Cage, CMA sent at 09/18/2021  8:38 AM EDT -----  ----- Message ----- From: Quintella Reichert, MD Sent: 09/17/2021   1:19 PM EDT To: Cv Div Sleep Studies  Please let patient know that they have sleep apnea and recommend treating with CPAP.  Please order an auto CPAP from 4-15cm H2O with heated humidity and mask of choice.  Order overnight pulse ox on CPAP.  Followup with me in 6 weeks.

## 2021-09-23 NOTE — Addendum Note (Signed)
Addended by: Reesa Chew on: 09/23/2021 07:01 PM   Modules accepted: Orders

## 2021-09-30 NOTE — Telephone Encounter (Signed)
Prior Authorization for NPSG sent to [facility] via web portal. Tracking Number.  do not require Pre-Authorization by [place].

## 2021-10-16 ENCOUNTER — Ambulatory Visit (INDEPENDENT_AMBULATORY_CARE_PROVIDER_SITE_OTHER): Payer: BC Managed Care – PPO | Admitting: Psychologist

## 2021-10-16 DIAGNOSIS — F411 Generalized anxiety disorder: Secondary | ICD-10-CM | POA: Diagnosis not present

## 2021-10-16 DIAGNOSIS — F320 Major depressive disorder, single episode, mild: Secondary | ICD-10-CM | POA: Diagnosis not present

## 2021-10-16 NOTE — Plan of Care (Signed)

## 2021-10-16 NOTE — Progress Notes (Signed)
                Iran Rowe, PsyD 

## 2021-10-16 NOTE — Progress Notes (Signed)
[facility] Counselor Initial Adult Exam  Name: Dale Macias Date: 10/16/2021 MRN: [id number] DOB: 09-27-1990 PCP: Pcp, No  Time spent: 11:04 am to 11:38 am; total time: 34 minutes  This session was held via video webex teletherapy due to the coronavirus risk at this time. The patient consented to video teletherapy and was located at his home during this session. He is aware it is the responsibility of the patient to secure confidentiality on his end of the session. The provider was in a private home office for the duration of this session. Limits of confidentiality were discussed with the patient.   Guardian/Payee:  NA    Paperwork requested: No   Reason for Visit /Presenting Problem: Depression and anxiety   Mental Status Exam: Appearance:   Well Groomed     Behavior:  Appropriate  Motor:  Normal  Speech/Language:   Clear and Coherent  Affect:  Appropriate  Mood:  normal  Thought process:  normal  Thought content:    WNL  Sensory/Perceptual disturbances:    WNL  Orientation:  oriented to person, place, and time/date  Attention:  Good  Concentration:  Good  Memory:  WNL  Fund of knowledge:   Good  Insight:    Good  Judgment:   Good  Impulse Control:  Good     Reported Symptoms:  The patient endorsed experiencing the following: feeling restless, feeling on edge, multiple stressors, feeling overwhelmed, racing thoughts, racing thoughts, and difficulty controlling worries. He denied suicidal and homicidal ideation.   The patient endorsed experiencing the following: feeling down, sad, low self-esteem, fatigue, lack of motivation, social isolation, and avoiding pleasurable activities. He denied suicidal and homicidal ideation.   Risk Assessment: Danger to Self:  No Self-injurious Behavior: No Danger to Others: No Duty to Warn:no Physical Aggression / Violence:No  Access to Firearms a concern: No  Gang Involvement:No  Patient / guardian was educated  about steps to take if suicide or homicide risk level increases between visits: n/a While future psychiatric events cannot be accurately predicted, the patient does not currently require acute inpatient psychiatric care and does not currently meet [facility] involuntary commitment criteria.  Substance Abuse History: Current substance abuse:  Patient described himself as experiencing binge drinking.      Past Psychiatric History:   Previous psychological history is significant for depression Outpatient Providers:NA History of Psych Hospitalization: No  Psychological Testing:  NA    Abuse History:  Victim of: No.,  NA    Report needed: No. Victim of Neglect:No. Perpetrator of  NA   Witness / Exposure to Domestic Violence: No   Protective Services Involvement: No  Witness to [employer] Violence:  No   Family History: No family history on file.  Living situation: the patient lives with their family  Sexual Orientation: Straight  Relationship Status: married  Name of spouse / other:Grace If a parent, number of children / ages:Patient has a 79 year old daughter named Lajoyce Corners and a 34 year old son whose son's name is Bo  Lawyer: Delorise Shiner and a friend  Surveyor, quantity Stress:  No   Income/Employment/Disability: Employment  Financial planner: No   Educational History: Education: college graduate  Religion/Sprituality/World View: Christian  Any cultural differences that may affect / interfere with treatment:  not applicable   Recreation/Hobbies: Architect and fishing   Stressors: Health problems    Strengths: Supportive Relationships  Barriers:  NA   Legal History: Pending legal issue / charges: The patient has  no significant history of legal issues. History of legal issue / charges: Larceny  Medical History/Surgical History: reviewed Past Medical History:  Diagnosis Date   Myocarditis ([facility]) 11/2020   Myopericarditis 11/2020    Past Surgical History:  Procedure  Laterality Date   IR FL GUIDED LOC OF NEEDLE/CATH TIP FOR SPINAL INJECTION LT  07/24/2021    Medications: Current Outpatient Medications  Medication Sig Dispense Refill   acetaminophen (TYLENOL) 500 MG tablet Take 1,000 mg by mouth every 6 (six) hours as needed for mild pain, headache or fever.     bisoprolol (ZEBETA) 5 MG tablet Take 0.5 tablets (2.5 mg total) by mouth daily. 15 tablet 5   colchicine 0.6 MG tablet Take 1 tablet (0.6 mg total) by mouth 2 (two) times daily. 60 tablet 1   pantoprazole (PROTONIX) 40 MG tablet Take 1 tablet (40 mg total) by mouth daily. 30 tablet 1   sacubitril-valsartan (ENTRESTO) 24-26 MG Take 1 tablet by mouth 2 (two) times daily. 60 tablet 6   spironolactone (ALDACTONE) 25 MG tablet Take 0.5 tablets (12.5 mg total) by mouth daily. 90 tablet 0   No current facility-administered medications for this visit.    Allergies  Allergen Reactions   Benzonatate Swelling    Throat swelling    Diagnoses:  F41.1 generalized anxiety disorder and F32.0 major depressive affective disorder, single episode, mild   Plan of Care: The patient is a 31 year old Caucasian male who was referred due to experiencing anxiety and depression. The patient lives at home with his wife, two children and a dog. The patient meets criteria for a diagnosis of F41.1 generalized anxiety disorder based off of the following: feeling restless, feeling on edge, multiple stressors, feeling overwhelmed, racing thoughts, racing thoughts, and difficulty controlling worries. He denied suicidal and homicidal ideation. The patient also meets criteria for a diagnosis of F32.0 major depressive affective disorder, single episode, mild based off of the following: feeling down, sad, low self-esteem, fatigue, lack of motivation, social isolation, and avoiding pleasurable activities. He denied suicidal and homicidal ideation.   The patient that he wants to drink less, not have outbursts, develop coping skills, and  process emotions.   This psychologist makes the recommendation that the patient participate in therapy at least once a month. Depending on the severity of what is occurring possibly bi-weekly.    Hilbert Corrigan, PsyD

## 2021-11-05 ENCOUNTER — Encounter (HOSPITAL_COMMUNITY): Payer: BC Managed Care – PPO | Admitting: Internal Medicine

## 2021-11-05 ENCOUNTER — Ambulatory Visit (HOSPITAL_COMMUNITY): Admission: RE | Admit: 2021-11-05 | Payer: BC Managed Care – PPO | Source: Ambulatory Visit

## 2021-11-13 ENCOUNTER — Ambulatory Visit: Payer: BC Managed Care – PPO | Admitting: Psychologist

## 2021-11-23 ENCOUNTER — Ambulatory Visit (HOSPITAL_COMMUNITY)
Admission: RE | Admit: 2021-11-23 | Discharge: 2021-11-23 | Disposition: A | Discharge status: Home / Self Care | Payer: BC Managed Care – PPO | Source: Ambulatory Visit | Attending: Family Medicine | Admitting: Family Medicine

## 2021-11-23 DIAGNOSIS — I5023 Acute on chronic systolic (congestive) heart failure: Secondary | ICD-10-CM | POA: Insufficient documentation

## 2021-11-23 DIAGNOSIS — R778 Other specified abnormalities of plasma proteins: Secondary | ICD-10-CM | POA: Insufficient documentation

## 2021-11-23 LAB — ECHOCARDIOGRAM LIMITED
Area-P 1/2: 3.4 cm2
S' Lateral: 2.6 cm

## 2021-11-23 NOTE — Progress Notes (Signed)
  Echocardiogram 2D Echocardiogram has been performed.  Gerda Diss 11/23/2021, 3:44 PM

## 2022-01-07 ENCOUNTER — Encounter (HOSPITAL_COMMUNITY): Payer: BC Managed Care – PPO | Admitting: Internal Medicine

## 2023-04-05 ENCOUNTER — Telehealth (HOSPITAL_COMMUNITY): Payer: Self-pay | Admitting: Cardiology

## 2023-04-05 NOTE — Telephone Encounter (Signed)
 Reports chest tightness/pressure at the center of chest x months Reports squeezing muscle pains off and on @ mid chest area x months   Reports new CP-shocking(described as TENS unit shock) mild pain and tingling L side chest pain into arm today while working. Reports it was nothing strenuous or exerting    Increase in SOB and fatigue over the past few months    Reports dizziness off and on x 2 weeks Denies N/V Increase in acid reflux x months  Unsure of what makes pains worse or relives   Would like to know if this is something that should be evaluated soon or just schedule follow up to discuss

## 2023-04-05 NOTE — Telephone Encounter (Signed)
 Pt aware Fu made

## 2023-04-06 ENCOUNTER — Other Ambulatory Visit: Payer: Self-pay

## 2023-04-06 ENCOUNTER — Emergency Department (HOSPITAL_COMMUNITY): Payer: 59

## 2023-04-06 ENCOUNTER — Emergency Department (HOSPITAL_COMMUNITY)
Admission: EM | Admit: 2023-04-06 | Discharge: 2023-04-06 | Disposition: A | Discharge status: Home / Self Care | Payer: 59 | Attending: Emergency Medicine | Admitting: Emergency Medicine

## 2023-04-06 DIAGNOSIS — R079 Chest pain, unspecified: Secondary | ICD-10-CM | POA: Diagnosis present

## 2023-04-06 DIAGNOSIS — R072 Precordial pain: Secondary | ICD-10-CM | POA: Diagnosis not present

## 2023-04-06 DIAGNOSIS — I509 Heart failure, unspecified: Secondary | ICD-10-CM | POA: Insufficient documentation

## 2023-04-06 DIAGNOSIS — F17200 Nicotine dependence, unspecified, uncomplicated: Secondary | ICD-10-CM | POA: Diagnosis not present

## 2023-04-06 LAB — BASIC METABOLIC PANEL
Anion gap: 9 (ref 5–15)
BUN: 13 mg/dL (ref 6–20)
CO2: 26 mmol/L (ref 22–32)
Calcium: 9.4 mg/dL (ref 8.9–10.3)
Chloride: 103 mmol/L (ref 98–111)
Creatinine, Ser: 1.25 mg/dL — ABNORMAL HIGH (ref 0.61–1.24)
GFR, Estimated: >60 mL/min (ref >60)
Glucose, Bld: 108 mg/dL — ABNORMAL HIGH (ref 70–99)
Potassium: 4.2 mmol/L (ref 3.5–5.1)
Sodium: 138 mmol/L (ref 135–145)

## 2023-04-06 LAB — CBC
HCT: 44.8 % (ref 39.0–52.0)
Hemoglobin: 15 g/dL (ref 13.0–17.0)
MCH: 29.1 pg (ref 26.0–34.0)
MCHC: 33.5 g/dL (ref 30.0–36.0)
MCV: 86.8 fL (ref 80.0–100.0)
Platelets: 187 10*3/uL (ref 150–400)
RBC: 5.16 MIL/uL (ref 4.22–5.81)
RDW: 12.1 % (ref 11.5–15.5)
WBC: 5.4 10*3/uL (ref 4.0–10.5)
nRBC: 0 % (ref 0.0–0.2)

## 2023-04-06 LAB — LIPASE, BLOOD: Lipase: 35 U/L (ref 11–51)

## 2023-04-06 LAB — HEPATIC FUNCTION PANEL
ALT: 28 U/L (ref 0–44)
AST: 28 U/L (ref 15–41)
Albumin: 4 g/dL (ref 3.5–5.0)
Alkaline Phosphatase: 41 U/L (ref 38–126)
Bilirubin, Direct: 0.1 mg/dL (ref 0.0–0.2)
Indirect Bilirubin: 0.7 mg/dL (ref 0.3–0.9)
Total Bilirubin: 0.8 mg/dL (ref 0.0–1.2)
Total Protein: 6.9 g/dL (ref 6.5–8.1)

## 2023-04-06 LAB — MAGNESIUM: Magnesium: 1.8 mg/dL (ref 1.7–2.4)

## 2023-04-06 LAB — D-DIMER, QUANTITATIVE: D-Dimer, Quant: <0.27 ug{FEU}/mL (ref 0.00–0.50)

## 2023-04-06 LAB — TROPONIN I (HIGH SENSITIVITY)
Troponin I (High Sensitivity): <2 ng/L (ref <18)
Troponin I (High Sensitivity): <2 ng/L (ref <18)

## 2023-04-06 MED ORDER — PANTOPRAZOLE SODIUM 40 MG PO TBEC: 40.0000 mg | DELAYED_RELEASE_TABLET | Freq: Every day | ORAL | 1 refills | Status: DC

## 2023-04-06 NOTE — ED Triage Notes (Addendum)
 Pt. Stated, Ive had chest pain for 5 months to a year , but yesterday I HAD SOME CHEST PAIN LIKE SOMEONE SITTING ON MY CHEST , dizziness and SOB. I feel like like there is a jolt that comes on and goes into my arm pit.. My cardiligist told me to come here.

## 2023-04-06 NOTE — Discharge Instructions (Signed)
 Please read and follow all provided instructions.  Your diagnoses today include:  1. Precordial pain     Tests performed today include: An EKG of your heart A chest x-ray Cardiac enzymes - a blood test for heart muscle damage Blood counts and electrolytes D-dimer screening test for blood clot: was negative Vital signs. See below for your results today.   Medications prescribed:  Pantoprazole  - stomach acid reducer  Take any prescribed medications only as directed.  Follow-up instructions: Please follow-up with your primary care provider as soon as you can for further evaluation of your symptoms.   Return instructions:  SEEK IMMEDIATE MEDICAL ATTENTION IF: You have severe chest pain, especially if the pain is crushing or pressure-like and spreads to the arms, back, neck, or jaw, or if you have sweating, nausea or vomiting, or trouble with breathing. THIS IS AN EMERGENCY. Do not wait to see if the pain will go away. Get medical help at once. Call 911. DO NOT drive yourself to the hospital.  Your chest pain gets worse and does not go away after a few minutes of rest.  You have an attack of chest pain lasting longer than what you usually experience.  You have significant dizziness, if you pass out, or have trouble walking.  You have chest pain not typical of your usual pain for which you originally saw your caregiver.  You have any other emergent concerns regarding your health.  Additional Information: Chest pain comes from many different causes. Your caregiver has diagnosed you as having chest pain that is not specific for one problem, but does not require admission.  You are at low risk for an acute heart condition or other serious illness.   Your vital signs today were: BP 139/89   Pulse 67   Temp 98 F (36.7 C)   Resp 15   Ht 5' 10 (1.778 m)   Wt 86.2 kg   SpO2 100%   BMI 27.26 kg/m  If your blood pressure (BP) was elevated above 135/85 this visit, please have this  repeated by your doctor within one month. --------------

## 2023-04-06 NOTE — ED Provider Notes (Signed)
 [place] [facility] AT [facility] Provider Note   CSN: [id number] Arrival date & time: 04/06/23  0847     History  Chief Complaint  Patient presents with   Chest Pain   Dizziness   Gastroesophageal Reflux    Dale Macias is a 33 y.o. male.  Patient with history of myopericarditis, subsequent CHF --presents to the emergency department today for evaluation of chest pains.  Patient reports ongoing reflux symptoms for several months.  More recently over the past several weeks he has been having a left-sided chest sensation that he describes as squeezing.  This will come and go, usually daily, lasting for 20 to 30 seconds.  Not really exertional.  Over the past day patient has had more of a pressure in the mid chest which was concerning to him.  He states that he discussed the symptoms with his wife, who urged him to call his cardiologist, and they recommended that he come to the hospital.  They did schedule an outpatient follow-up appointment for March.  Patient does have some shortness of breath.  He has noted this more when he is working.  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  He also does extended drives, least twice a week, lasting 3 to 6 hours.  He does stop to use the bathroom usually once during these trips.  He denies lower extremity swelling or pain. Patient denies risk factors for pulmonary embolism including: unilateral leg swelling, history of DVT/PE/other blood clots, use of exogenous hormones, recent immobilizations, recent surgery, recent travel (>4hr segment), malignancy, hemoptysis.  Denies history of hypertension, high cholesterol, diabetes.  He does smoke socially, not daily.       Home Medications Prior to Admission medications   Medication Sig Start Date End Date Taking? Authorizing Provider  acetaminophen  (TYLENOL ) 500 MG tablet Take 1,000 mg by mouth every 6 (six) hours as needed for mild pain, headache or fever.    [provider]  bisoprolol  (ZEBETA ) 5 MG tablet Take 0.5 tablets (2.5 mg total) by mouth daily. 08/25/21   Rolan Ezra RAMAN, MD  colchicine  0.6 MG tablet Take 1 tablet (0.6 mg total) by mouth 2 (two) times daily. 07/25/21   Gonfa, Taye T, MD  pantoprazole  (PROTONIX ) 40 MG tablet Take 1 tablet (40 mg total) by mouth daily. 07/25/21   Gonfa, Taye T, MD  sacubitril -valsartan  (ENTRESTO ) 24-26 MG Take 1 tablet by mouth 2 (two) times daily. 09/09/21   [place], Harlene HERO, FNP  spironolactone  (ALDACTONE ) 25 MG tablet Take 0.5 tablets (12.5 mg total) by mouth daily. 08/03/21 01/30/22  Glena Harlene HERO, FNP      Allergies    Benzonatate    Review of Systems   Review of Systems  Physical Exam Updated Vital Signs BP 139/89   Pulse 67   Temp 98 F (36.7 C)   Resp 15   Ht 5' 10 (1.778 m)   Wt 86.2 kg   SpO2 100%   BMI 27.26 kg/m  Physical Exam Vitals and nursing note reviewed.  Constitutional:      Appearance: He is well-developed. He is not diaphoretic.  HENT:     Head: Normocephalic and atraumatic.     Mouth/Throat:     Mouth: Mucous membranes are not dry.  Eyes:     Conjunctiva/sclera: Conjunctivae normal.  Neck:     Vascular: Normal carotid pulses. No carotid bruit or JVD.     Trachea: Trachea normal. No tracheal deviation.  Cardiovascular:  Rate and Rhythm: Normal rate and regular rhythm.     Pulses: No decreased pulses.          Radial pulses are 2+ on the right side and 2+ on the left side.     Heart sounds: Normal heart sounds, S1 normal and S2 normal. Heart sounds not distant. No murmur heard. Pulmonary:     Effort: Pulmonary effort is normal. No respiratory distress.     Breath sounds: Normal breath sounds. No wheezing.  Chest:     Chest wall: No tenderness.  Abdominal:     General: Bowel sounds are normal.     Palpations: Abdomen is soft.     Tenderness: There is no abdominal tenderness. There is no guarding or rebound.  Musculoskeletal:     Cervical back: Normal  range of motion and neck supple. No muscular tenderness.     Right lower leg: No edema.     Left lower leg: No edema.  Skin:    General: Skin is warm and dry.     Coloration: Skin is not pale.  Neurological:     Mental Status: He is alert. Mental status is at baseline.  Psychiatric:        Mood and Affect: Mood normal.     ED Results / Procedures / Treatments   Labs (all labs ordered are listed, but only abnormal results are displayed) Labs Reviewed  BASIC METABOLIC PANEL - Abnormal; Notable for the following components:      Result Value   Glucose, Bld 108 (*)    Creatinine, Ser 1.25 (*)    All other components within normal limits  CBC  HEPATIC FUNCTION PANEL  LIPASE, BLOOD  MAGNESIUM   D-DIMER, QUANTITATIVE  TROPONIN I (HIGH SENSITIVITY)  TROPONIN I (HIGH SENSITIVITY)    EKG EKG Interpretation Date/Time:  Wednesday April 06 2023 10:52:45 EST Ventricular Rate:  75 PR Interval:  215 QRS Duration:  80 QT Interval:  347 QTC Calculation: 388 R Axis:   78  Text Interpretation: Sinus rhythm Prolonged PR interval Consider left atrial enlargement No significant change since last tracing Confirmed by Emil Share [phone]) on 04/06/2023 11:47:15 AM  Radiology DG Chest 2 View Result Date: 04/06/2023 CLINICAL DATA:  Chest pain. EXAM: CHEST - 2 VIEW COMPARISON:  07/22/2021. FINDINGS: Bilateral lung fields are clear. Bilateral costophrenic angles are clear. Normal cardio-mediastinal silhouette. No acute osseous abnormalities. The soft tissues are within normal limits. IMPRESSION: No active cardiopulmonary disease. Electronically Signed   By: Ree Molt M.D.   On: 04/06/2023 09:41    Procedures Procedures    Medications Ordered in ED Medications - No data to display  ED Course/ Medical Decision Making/ A&P    Patient seen and examined. History obtained directly from patient. Work-up including labs, imaging, EKG ordered in triage, if performed, were reviewed.    Labs/EKG:  Independently reviewed and interpreted.  This included: CBC unremarkable; BMP unremarkable; troponin less than 2.  Magnesium  normal.  Hepatic function panel and lipase normal.  Awaiting second troponin, added D-dimer.  EKG personally reviewed and interpreted as above.  Imaging: Independently visualized and interpreted.  This included: Chest x-ray, agree negative.  Medications/Fluids: None ordered  Most recent vital signs reviewed and are as follows: BP 139/89   Pulse 67   Temp 98 F (36.7 C)   Resp 15   Ht 5' 10 (1.778 m)   Wt 86.2 kg   SpO2 100%   BMI 27.26 kg/m   Initial impression: Atypical  chest pain in patient with history of myocarditis  12:54 PM Reassessment performed. Patient appears stable, comfortable.  Labs personally reviewed and interpreted including: D-dimer negative, troponin less than 2 stable.  Reviewed pertinent lab work and imaging with patient at bedside. Questions answered.   Most current vital signs reviewed and are as follows: BP 139/89   Pulse 67   Temp 98 F (36.7 C)   Resp 15   Ht 5' 10 (1.778 m)   Wt 86.2 kg   SpO2 100%   BMI 27.26 kg/m   Plan: Discharge to home.   Prescriptions written for: Pantoprazole   Return and follow-up instructions: I encouraged patient to return to ED with severe chest pain, especially if the pain is crushing or pressure-like and spreads to the arms, back, neck, or jaw, or if they have associated sweating, vomiting, or shortness of breath with the pain, or significant pain with activity. We discussed that the evaluation here today indicates a low-risk of serious cause of chest pain, including heart trouble or a blood clot, but no evaluation is perfect and chest pain can evolve with time. The patient verbalized understanding and agreed.  I encouraged patient to follow-up with their provider in the next 48 hours for recheck.                                  Medical Decision Making Amount and/or Complexity of Data  Reviewed Labs: ordered. Radiology: ordered.  Risk Prescription drug management.   For this patient's complaint of chest pain, the following emergent conditions were considered on the differential diagnosis: acute coronary syndrome, pulmonary embolism, pneumothorax, myocarditis, pericardial tamponade, aortic dissection, thoracic aortic aneurysm complication, esophageal perforation.   Other causes were also considered including: gastroesophageal reflux disease, musculoskeletal pain including costochondritis, pneumonia/pleurisy, herpes zoster, pericarditis.  In regards to possibility of ACS, patient has atypical features of pain, non-ischemic and unchanged EKG and negative troponin(s). Heart score was calculated to be 0.  In regards to possibility of PE, symptoms are atypical for PE and risk profile is low, making PE low likelihood.  D-dimer negative.  The patient's vital signs, pertinent lab work and imaging were reviewed and interpreted as discussed in the ED course. Hospitalization was considered for further testing, treatments, or serial exams/observation. However as patient is well-appearing, has a stable exam, and reassuring studies today, I do not feel that they warrant admission at this time. This plan was discussed with the patient who verbalizes agreement and comfort with this plan and seems reliable and able to return to the Emergency Department with worsening or changing symptoms.            Final Clinical Impression(s) / ED Diagnoses Final diagnoses:  Precordial pain    Rx / DC Orders ED Discharge Orders          Ordered    pantoprazole  (PROTONIX ) 40 MG tablet  Daily        04/06/23 1254              Lori-Ann Lindfors, PA-C 04/06/23 1257    [address] Victoria K, DO 04/06/23 1603

## 2023-04-06 NOTE — ED Provider Triage Note (Signed)
 Emergency Medicine Provider Triage Evaluation Note  Dale Macias , a 33 y.o. male  was evaluated in triage.  Pt complains of left-sided chest pain.  He said left-sided chest pain that is been going on for at least a year.  It got transiently better and then he thinks maybe got worse a couple weeks ago.  He started getting some sharp chest discomfort.  Sometimes last for about 20 seconds and lasts as long as a couple minutes.  into his left axilla and down the left side of the chest.  Nothing seems to make that come or go.  Review of Systems  Positive: Left-sided chest pain Negative: Abdominal pain cough fever  Physical Exam  BP (!) 151/72 (BP Location: Right Arm)   Pulse 84   Temp 98 F (36.7 C)   Resp 19   Ht 5' 10 (1.778 m)   Wt 86.2 kg   SpO2 99%   BMI 27.26 kg/m  Gen:   Awake, no distress   Resp:  Normal effort  MSK:   Moves extremities without difficulty  Other:  No obvious chest wall pain on exam no lower extremity edema  Medical Decision Making  Medically screening exam initiated at 10:03 AM.  Appropriate orders placed.  Dale Macias was informed that the remainder of the evaluation will be completed by another provider, this initial triage assessment does not replace that evaluation, and the importance of remaining in the ED until their evaluation is complete.  Patient with a history of myopericarditis here with left-sided chest discomfort.  Seems to come and go.  He called the cardiology clinic and they had encouraged him to come to the ED for evaluation.   Emil Share, DO 04/06/23 1005

## 2023-05-26 ENCOUNTER — Other Ambulatory Visit (HOSPITAL_COMMUNITY): Payer: Self-pay

## 2023-05-26 DIAGNOSIS — I5022 Chronic systolic (congestive) heart failure: Secondary | ICD-10-CM

## 2023-06-03 ENCOUNTER — Encounter (HOSPITAL_COMMUNITY): Payer: Self-pay | Admitting: Internal Medicine

## 2023-06-03 ENCOUNTER — Ambulatory Visit (HOSPITAL_BASED_OUTPATIENT_CLINIC_OR_DEPARTMENT_OTHER)
Admission: RE | Admit: 2023-06-03 | Discharge: 2023-06-03 | Disposition: A | Discharge status: Home / Self Care | Payer: Self-pay | Source: Ambulatory Visit | Attending: Internal Medicine | Admitting: Internal Medicine

## 2023-06-03 ENCOUNTER — Ambulatory Visit (HOSPITAL_COMMUNITY)
Admission: RE | Admit: 2023-06-03 | Discharge: 2023-06-03 | Disposition: A | Discharge status: Home / Self Care | Payer: 59 | Source: Ambulatory Visit | Attending: Internal Medicine | Admitting: Internal Medicine

## 2023-06-03 VITALS — BP 128/82 | HR 83 | Wt 207.4 lb

## 2023-06-03 DIAGNOSIS — I514 Myocarditis, unspecified: Secondary | ICD-10-CM | POA: Insufficient documentation

## 2023-06-03 DIAGNOSIS — Z720 Tobacco use: Secondary | ICD-10-CM

## 2023-06-03 DIAGNOSIS — F17210 Nicotine dependence, cigarettes, uncomplicated: Secondary | ICD-10-CM | POA: Insufficient documentation

## 2023-06-03 DIAGNOSIS — I5022 Chronic systolic (congestive) heart failure: Secondary | ICD-10-CM | POA: Insufficient documentation

## 2023-06-03 DIAGNOSIS — R079 Chest pain, unspecified: Secondary | ICD-10-CM | POA: Diagnosis not present

## 2023-06-03 DIAGNOSIS — I3139 Other pericardial effusion (noninflammatory): Secondary | ICD-10-CM

## 2023-06-03 DIAGNOSIS — Z79899 Other long term (current) drug therapy: Secondary | ICD-10-CM | POA: Diagnosis not present

## 2023-06-03 DIAGNOSIS — F1010 Alcohol abuse, uncomplicated: Secondary | ICD-10-CM

## 2023-06-03 DIAGNOSIS — I509 Heart failure, unspecified: Secondary | ICD-10-CM | POA: Diagnosis present

## 2023-06-03 LAB — ECHOCARDIOGRAM COMPLETE
Area-P 1/2: 3.89 cm2
S' Lateral: 3.4 cm
Single Plane A4C EF: 53.9 %

## 2023-06-03 NOTE — Progress Notes (Signed)
 [facility] Note   PCP: Pcp, No HF Cardiologist: Dr. Gala Romney  HPI: Dale Macias is a 33 y.o. male with no significant PMHx.    Admitted 9/22 with acute myopericarditis, likely secondary to COVID infection 1 month prior (had the virus x 3 times, unvaccinated). BP initially low and given IVF, resulting in volume status being up requiring a dose of IV lasix 120 mg. Echo with EF 45%. Started on colchicine and NSAIDs. cMRI showed LV EF 49%, normal RV, no myocardial LGE but mildly elevated T2 signal anterior wall and appears to be LGE in the pericardium adjacent to the anterior wall.  Follow up 12/22 had been off meds x 1 month, but doing well with no symptoms so decision made to not restart meds and could follow up as needed.  Echo 12/22 EF 55-60%.  Seen 3/23 for acute visit with complaints of fatigue and decreased exercise endurance. Drinking more ETOH and not sleeping well. Labs unremarkable and Echo arranged, showing EF 55-60%, normal RV.  Admitted 4/23 with fever, chills, neck pain and CP; concern for meningitis due to recent travel to [place]. LP unremarkable, required blood patch later for intractable headache. Echo showed EF of 30 to 35%, RWMA. Underwent cMRI showing mild LV global hypokinesis with EF of 48%, mild RV global hypokinesis with EF of 42% and gadolinium uptake consistent  with recurrent myopericarditis, and small circumferential pericardial effusion. ANA and ESR normal. ID consulted and felt myopericarditis to be viral. Started on ibuprofen and colchicine per cardiology.  Entresto started, no other GDMT due to soft BP and bradycardia. Troponin elevated 244>4900, felt due to myopericarditis so no cath.  Returns for routine f/u. Says he is wide-open. Runs 2 construction companies. Denies SOB. Gets rand chest tightness in left chset. Lasts 20 seconds and goes away. Feels like a cramp. Seen in ED. 04/06/23 hstrop <2. ECG ok. Doesn't go to the gym. Stopped  taking meds 10/22   Echo today 06/03/23 EF 55-60% No effusion Personally reviewed    Cardiac Studies  - cMRI (4/23): LVEF 48%, RVEF 42%, findings consistent with recurrent myopericarditis, small circumferential pericardial effusion  - Echo (4/23): EF 30-35%, LV moderately decreased, RV ok  - Echo (3/23): EF 55-60%, RV ok  - Echo (12/22): EF 55-60%  - Echo (9/22): EF 45%  - cMRI (9/22):LV EF 49%, normal RV, no myocardial LGE but mildly elevated T2 signal anterior wall and appears to be LGE in the pericardium adjacent to the anterior wall.  Past Medical History:  Diagnosis Date   Myocarditis (HCC) 11/2020   Myopericarditis 11/2020   Current Outpatient Medications  Medication Sig Dispense Refill   acetaminophen (TYLENOL) 500 MG tablet Take 1,000 mg by mouth every 6 (six) hours as needed for mild pain, headache or fever. (Patient not taking: Reported on 06/03/2023)     bisoprolol (ZEBETA) 5 MG tablet Take 0.5 tablets (2.5 mg total) by mouth daily. (Patient not taking: Reported on 06/03/2023) 15 tablet 5   colchicine 0.6 MG tablet Take 1 tablet (0.6 mg total) by mouth 2 (two) times daily. (Patient not taking: Reported on 06/03/2023) 60 tablet 1   pantoprazole (PROTONIX) 40 MG tablet Take 1 tablet (40 mg total) by mouth daily. (Patient not taking: Reported on 06/03/2023) 30 tablet 1   sacubitril-valsartan (ENTRESTO) 24-26 MG Take 1 tablet by mouth 2 (two) times daily. (Patient not taking: Reported on 06/03/2023) 60 tablet 6   spironolactone (ALDACTONE) 25 MG tablet Take 0.5 tablets (12.5  mg total) by mouth daily. (Patient not taking: Reported on 06/03/2023) 90 tablet 0   No current facility-administered medications for this encounter.   Allergies  Allergen Reactions   Benzonatate Swelling    Throat swelling   Social History   Socioeconomic History   Marital status: Unknown    Spouse name: Not on file   Number of children: Not on file   Years of education: Not on file   Highest education  level: Not on file  Occupational History   Not on file  Tobacco Use   Smoking status: Some Days    Types: Cigarettes   Smokeless tobacco: Never  Substance and Sexual Activity   Alcohol use: Yes    Comment: 4-12 oz nightly   Drug use: Never   Sexual activity: Not on file  Other Topics Concern   Not on file  Social History Narrative   Not on file   Social Drivers of Health   Financial Resource Strain: Not on file  Food Insecurity: Not on file  Transportation Needs: Not on file  Physical Activity: Not on file  Stress: Not on file  Social Connections: Not on file  Intimate Partner Violence: Not on file   History reviewed. No pertinent family history.  BP 128/82   Pulse 83   Wt 94.1 kg (207 lb 6.4 oz)   SpO2 98%   BMI 29.76 kg/m   Wt Readings from Last 3 Encounters:  06/03/23 94.1 kg (207 lb 6.4 oz)  04/06/23 86.2 kg (190 lb)  09/14/21 87.9 kg (193 lb 12.8 oz)   PHYSICAL EXAM: General:  Well appearing. No resp difficulty HEENT: normal Neck: supple. no JVD. Carotids 2+ bilat; no bruits. No lymphadenopathy or thryomegaly appreciated. Cor: PMI nondisplaced. Regular rate & rhythm. No rubs, gallops or murmurs. Lungs: clear Abdomen: soft, nontender, nondistended. No hepatosplenomegaly. No bruits or masses. Good bowel sounds. Extremities: no cyanosis, clubbing, rash, edema Neuro: alert & orientedx3, cranial nerves grossly intact. moves all 4 extremities w/o difficulty. Affect pleasant   ASSESSMENT & PLAN  1. H/o myopericarditis:  - h/o elevated trop 2/2 myopericarditis 9/22. - cMRI (9/22) with LVEF 49%, normal RV, no myocardial LGE but mildly elevated T2 signal anterior wall and appears to be LGE in the pericardium adjacent to the anterior wall.  - Coronary CT 9/22 no CAD. + PNA   - Echo (12/22): EF improved 55-60% - Echo (3/23): EF 55-60%, RV ok - Readmitted 4/23 with myopericarditis after traveling to [place]. - Echo (4/23): EF 30-35%, LV moderately  decreased, RV ok - cMRI (4/23): LVEF 48%, RVEF 42%, findings consistent with recurrent myopericarditis, small circumferential pericardial effusion. - Echo today 06/03/23 EF 55-60% No effusion Personally reviewed - Off all meds. EF stable - Can f/u as needed  2. Pericardial effusion - Resolved   3. Chest pain - very atypical. doubt cardiac  3. ETOH use - No longer drinking ETOH. - Congratulated on cessation.  5. Tobacco Use - Smokes 1 pack per month  - Encouraged to quit    Dale Meres, MD 06/03/23

## 2023-06-03 NOTE — Patient Instructions (Signed)
 Congratulations you have graduated from the Heart Failure Clinic.  STOP all your medications.  Your physician recommends that you schedule a follow-up appointment in: as needed   If you have any questions or concerns before your next appointment please send [place] a message through [place] or call our office at [phone].    TO LEAVE A MESSAGE FOR THE NURSE SELECT OPTION 2, PLEASE LEAVE A MESSAGE INCLUDING: YOUR NAME DATE OF BIRTH CALL BACK NUMBER REASON FOR CALL**this is important as we prioritize the call backs  YOU WILL RECEIVE A CALL BACK THE SAME DAY AS LONG AS YOU CALL BEFORE 4:00 PM  At the [facility], you and your health needs are our priority. As part of our continuing mission to provide you with exceptional heart care, we have created designated Provider Care Teams. These Care Teams include your primary Cardiologist (physician) and Advanced Practice Providers (APPs- Physician Assistants and Nurse Practitioners) who all work together to provide you with the care you need, when you need it.   You may see any of the following providers on your designated Care Team at your next follow up: Dr Arvilla Meres Dr Marca Ancona Dr. Dorthula Nettles Dr. Clearnce Hasten Amy Filbert Schilder, NP Robbie Lis, [place] [facility] [place], [place] Brynda Peon, NP [place] Lee, NP Clarisa Kindred, NP Karle Plumber, PharmD Enos Fling, PharmD   Please be sure to bring in all your medications bottles to every appointment.    Thank you for choosing [facility]
